# Patient Record
Sex: Male | Born: 1975 | Race: White | Hispanic: No | Marital: Married | Smoking: Former smoker
Health system: Southern US, Community
[De-identification: ages and names within clinical notes are randomized; demographics above are authoritative.]

## PROBLEM LIST (undated history)

## (undated) DIAGNOSIS — J189 Pneumonia, unspecified organism: Secondary | ICD-10-CM

## (undated) DIAGNOSIS — T8859XA Other complications of anesthesia, initial encounter: Secondary | ICD-10-CM

## (undated) DIAGNOSIS — F419 Anxiety disorder, unspecified: Secondary | ICD-10-CM

## (undated) DIAGNOSIS — H53009 Unspecified amblyopia, unspecified eye: Secondary | ICD-10-CM

## (undated) DIAGNOSIS — J449 Chronic obstructive pulmonary disease, unspecified: Secondary | ICD-10-CM

## (undated) DIAGNOSIS — H209 Unspecified iridocyclitis: Secondary | ICD-10-CM

## (undated) DIAGNOSIS — Z87442 Personal history of urinary calculi: Secondary | ICD-10-CM

## (undated) DIAGNOSIS — S82401B Unspecified fracture of shaft of right fibula, initial encounter for open fracture type I or II: Secondary | ICD-10-CM

## (undated) DIAGNOSIS — H4311 Vitreous hemorrhage, right eye: Secondary | ICD-10-CM

## (undated) DIAGNOSIS — T4145XA Adverse effect of unspecified anesthetic, initial encounter: Secondary | ICD-10-CM

## (undated) DIAGNOSIS — H509 Unspecified strabismus: Secondary | ICD-10-CM

## (undated) DIAGNOSIS — J439 Emphysema, unspecified: Secondary | ICD-10-CM

## (undated) DIAGNOSIS — K219 Gastro-esophageal reflux disease without esophagitis: Secondary | ICD-10-CM

## (undated) HISTORY — DX: Unspecified amblyopia, unspecified eye: H53.009

---

## 2017-11-29 ENCOUNTER — Inpatient Hospital Stay (HOSPITAL_COMMUNITY): Payer: Worker's Compensation

## 2017-11-29 ENCOUNTER — Emergency Department (HOSPITAL_COMMUNITY): Payer: Worker's Compensation

## 2017-11-29 ENCOUNTER — Emergency Department (HOSPITAL_COMMUNITY): Payer: Worker's Compensation | Admitting: Anesthesiology

## 2017-11-29 ENCOUNTER — Inpatient Hospital Stay (HOSPITAL_COMMUNITY)
Admission: EM | Admit: 2017-11-29 | Discharge: 2017-12-13 | DRG: 003 | Disposition: A | Discharge status: Rehabilitation Facility | Payer: Worker's Compensation | Attending: General Surgery | Admitting: General Surgery

## 2017-11-29 ENCOUNTER — Other Ambulatory Visit: Payer: Self-pay

## 2017-11-29 ENCOUNTER — Encounter (HOSPITAL_COMMUNITY): Admission: EM | Disposition: A | Discharge status: Rehabilitation Facility | Payer: Self-pay | Source: Home / Self Care

## 2017-11-29 DIAGNOSIS — S2232XA Fracture of one rib, left side, initial encounter for closed fracture: Secondary | ICD-10-CM | POA: Diagnosis present

## 2017-11-29 DIAGNOSIS — S82401B Unspecified fracture of shaft of right fibula, initial encounter for open fracture type I or II: Secondary | ICD-10-CM | POA: Diagnosis present

## 2017-11-29 DIAGNOSIS — S02841A Fracture of lateral orbital wall, right side, initial encounter for closed fracture: Secondary | ICD-10-CM | POA: Diagnosis present

## 2017-11-29 DIAGNOSIS — S0240CA Maxillary fracture, right side, initial encounter for closed fracture: Secondary | ICD-10-CM | POA: Diagnosis present

## 2017-11-29 DIAGNOSIS — F329 Major depressive disorder, single episode, unspecified: Secondary | ICD-10-CM | POA: Diagnosis present

## 2017-11-29 DIAGNOSIS — S8782XA Crushing injury of left lower leg, initial encounter: Secondary | ICD-10-CM | POA: Diagnosis present

## 2017-11-29 DIAGNOSIS — S0921XA Traumatic rupture of right ear drum, initial encounter: Secondary | ICD-10-CM | POA: Diagnosis present

## 2017-11-29 DIAGNOSIS — Y9389 Activity, other specified: Secondary | ICD-10-CM

## 2017-11-29 DIAGNOSIS — S36115A Moderate laceration of liver, initial encounter: Secondary | ICD-10-CM | POA: Diagnosis present

## 2017-11-29 DIAGNOSIS — S8780XA Crushing injury of unspecified lower leg, initial encounter: Secondary | ICD-10-CM | POA: Diagnosis present

## 2017-11-29 DIAGNOSIS — Z978 Presence of other specified devices: Secondary | ICD-10-CM

## 2017-11-29 DIAGNOSIS — S0291XD Unspecified fracture of skull, subsequent encounter for fracture with routine healing: Secondary | ICD-10-CM | POA: Diagnosis not present

## 2017-11-29 DIAGNOSIS — R509 Fever, unspecified: Secondary | ICD-10-CM | POA: Diagnosis not present

## 2017-11-29 DIAGNOSIS — G8918 Other acute postprocedural pain: Secondary | ICD-10-CM

## 2017-11-29 DIAGNOSIS — J439 Emphysema, unspecified: Secondary | ICD-10-CM | POA: Diagnosis not present

## 2017-11-29 DIAGNOSIS — S069X9A Unspecified intracranial injury with loss of consciousness of unspecified duration, initial encounter: Secondary | ICD-10-CM | POA: Diagnosis not present

## 2017-11-29 DIAGNOSIS — S06330D Contusion and laceration of cerebrum, unspecified, without loss of consciousness, subsequent encounter: Secondary | ICD-10-CM | POA: Diagnosis not present

## 2017-11-29 DIAGNOSIS — T1490XA Injury, unspecified, initial encounter: Secondary | ICD-10-CM | POA: Diagnosis not present

## 2017-11-29 DIAGNOSIS — G960 Cerebrospinal fluid leak: Secondary | ICD-10-CM | POA: Diagnosis present

## 2017-11-29 DIAGNOSIS — T148XXA Other injury of unspecified body region, initial encounter: Secondary | ICD-10-CM | POA: Diagnosis not present

## 2017-11-29 DIAGNOSIS — N179 Acute kidney failure, unspecified: Secondary | ICD-10-CM | POA: Diagnosis not present

## 2017-11-29 DIAGNOSIS — E2749 Other adrenocortical insufficiency: Secondary | ICD-10-CM | POA: Diagnosis present

## 2017-11-29 DIAGNOSIS — R739 Hyperglycemia, unspecified: Secondary | ICD-10-CM | POA: Diagnosis not present

## 2017-11-29 DIAGNOSIS — S0219XA Other fracture of base of skull, initial encounter for closed fracture: Secondary | ICD-10-CM | POA: Diagnosis present

## 2017-11-29 DIAGNOSIS — S01311A Laceration without foreign body of right ear, initial encounter: Secondary | ICD-10-CM | POA: Diagnosis present

## 2017-11-29 DIAGNOSIS — Y990 Civilian activity done for income or pay: Secondary | ICD-10-CM

## 2017-11-29 DIAGNOSIS — S0219XD Other fracture of base of skull, subsequent encounter for fracture with routine healing: Secondary | ICD-10-CM | POA: Diagnosis not present

## 2017-11-29 DIAGNOSIS — J9601 Acute respiratory failure with hypoxia: Secondary | ICD-10-CM | POA: Diagnosis not present

## 2017-11-29 DIAGNOSIS — M7989 Other specified soft tissue disorders: Secondary | ICD-10-CM | POA: Diagnosis not present

## 2017-11-29 DIAGNOSIS — Z4659 Encounter for fitting and adjustment of other gastrointestinal appliance and device: Secondary | ICD-10-CM

## 2017-11-29 DIAGNOSIS — I959 Hypotension, unspecified: Secondary | ICD-10-CM | POA: Diagnosis present

## 2017-11-29 DIAGNOSIS — S134XXA Sprain of ligaments of cervical spine, initial encounter: Secondary | ICD-10-CM

## 2017-11-29 DIAGNOSIS — S161XXA Strain of muscle, fascia and tendon at neck level, initial encounter: Secondary | ICD-10-CM | POA: Diagnosis not present

## 2017-11-29 DIAGNOSIS — F419 Anxiety disorder, unspecified: Secondary | ICD-10-CM | POA: Diagnosis not present

## 2017-11-29 DIAGNOSIS — S0291XS Unspecified fracture of skull, sequela: Secondary | ICD-10-CM | POA: Diagnosis not present

## 2017-11-29 DIAGNOSIS — S36039A Unspecified laceration of spleen, initial encounter: Secondary | ICD-10-CM

## 2017-11-29 DIAGNOSIS — T796XXA Traumatic ischemia of muscle, initial encounter: Secondary | ICD-10-CM | POA: Diagnosis not present

## 2017-11-29 DIAGNOSIS — S7292XA Unspecified fracture of left femur, initial encounter for closed fracture: Secondary | ICD-10-CM

## 2017-11-29 DIAGNOSIS — F430 Acute stress reaction: Secondary | ICD-10-CM | POA: Diagnosis present

## 2017-11-29 DIAGNOSIS — J690 Pneumonitis due to inhalation of food and vomit: Secondary | ICD-10-CM | POA: Diagnosis not present

## 2017-11-29 DIAGNOSIS — R40241 Glasgow coma scale score 13-15, unspecified time: Secondary | ICD-10-CM | POA: Diagnosis present

## 2017-11-29 DIAGNOSIS — D62 Acute posthemorrhagic anemia: Secondary | ICD-10-CM | POA: Diagnosis not present

## 2017-11-29 DIAGNOSIS — S02609A Fracture of mandible, unspecified, initial encounter for closed fracture: Secondary | ICD-10-CM | POA: Diagnosis present

## 2017-11-29 DIAGNOSIS — E872 Acidosis: Secondary | ICD-10-CM | POA: Diagnosis present

## 2017-11-29 DIAGNOSIS — S36031A Moderate laceration of spleen, initial encounter: Secondary | ICD-10-CM | POA: Diagnosis present

## 2017-11-29 DIAGNOSIS — S060X9A Concussion with loss of consciousness of unspecified duration, initial encounter: Secondary | ICD-10-CM | POA: Diagnosis present

## 2017-11-29 DIAGNOSIS — R52 Pain, unspecified: Secondary | ICD-10-CM | POA: Diagnosis not present

## 2017-11-29 DIAGNOSIS — E871 Hypo-osmolality and hyponatremia: Secondary | ICD-10-CM | POA: Diagnosis not present

## 2017-11-29 DIAGNOSIS — S36113A Laceration of liver, unspecified degree, initial encounter: Secondary | ICD-10-CM | POA: Diagnosis not present

## 2017-11-29 DIAGNOSIS — S82401C Unspecified fracture of shaft of right fibula, initial encounter for open fracture type IIIA, IIIB, or IIIC: Secondary | ICD-10-CM | POA: Diagnosis present

## 2017-11-29 DIAGNOSIS — Z880 Allergy status to penicillin: Secondary | ICD-10-CM

## 2017-11-29 DIAGNOSIS — H9191 Unspecified hearing loss, right ear: Secondary | ICD-10-CM | POA: Diagnosis present

## 2017-11-29 DIAGNOSIS — G510 Bell's palsy: Secondary | ICD-10-CM | POA: Diagnosis present

## 2017-11-29 DIAGNOSIS — S82453B Displaced comminuted fracture of shaft of unspecified fibula, initial encounter for open fracture type I or II: Secondary | ICD-10-CM

## 2017-11-29 DIAGNOSIS — Z87891 Personal history of nicotine dependence: Secondary | ICD-10-CM

## 2017-11-29 DIAGNOSIS — S022XXA Fracture of nasal bones, initial encounter for closed fracture: Secondary | ICD-10-CM | POA: Diagnosis present

## 2017-11-29 DIAGNOSIS — Z419 Encounter for procedure for purposes other than remedying health state, unspecified: Secondary | ICD-10-CM

## 2017-11-29 DIAGNOSIS — F515 Nightmare disorder: Secondary | ICD-10-CM | POA: Diagnosis not present

## 2017-11-29 DIAGNOSIS — W208XXA Other cause of strike by thrown, projected or falling object, initial encounter: Secondary | ICD-10-CM | POA: Diagnosis present

## 2017-11-29 DIAGNOSIS — S82201B Unspecified fracture of shaft of right tibia, initial encounter for open fracture type I or II: Secondary | ICD-10-CM | POA: Diagnosis present

## 2017-11-29 DIAGNOSIS — Z930 Tracheostomy status: Secondary | ICD-10-CM | POA: Diagnosis not present

## 2017-11-29 DIAGNOSIS — S0292XA Unspecified fracture of facial bones, initial encounter for closed fracture: Secondary | ICD-10-CM

## 2017-11-29 DIAGNOSIS — D72829 Elevated white blood cell count, unspecified: Secondary | ICD-10-CM | POA: Diagnosis not present

## 2017-11-29 DIAGNOSIS — S06339S Contusion and laceration of cerebrum, unspecified, with loss of consciousness of unspecified duration, sequela: Secondary | ICD-10-CM | POA: Diagnosis not present

## 2017-11-29 DIAGNOSIS — H1131 Conjunctival hemorrhage, right eye: Secondary | ICD-10-CM | POA: Diagnosis not present

## 2017-11-29 DIAGNOSIS — F4310 Post-traumatic stress disorder, unspecified: Secondary | ICD-10-CM | POA: Diagnosis not present

## 2017-11-29 DIAGNOSIS — S7222XA Displaced subtrochanteric fracture of left femur, initial encounter for closed fracture: Secondary | ICD-10-CM | POA: Diagnosis present

## 2017-11-29 DIAGNOSIS — S93402A Sprain of unspecified ligament of left ankle, initial encounter: Secondary | ICD-10-CM | POA: Diagnosis present

## 2017-11-29 DIAGNOSIS — S8781XA Crushing injury of right lower leg, initial encounter: Secondary | ICD-10-CM | POA: Diagnosis present

## 2017-11-29 HISTORY — PX: INTRAMEDULLARY (IM) NAIL INTERTROCHANTERIC: SHX5875

## 2017-11-29 HISTORY — DX: Unspecified fracture of shaft of right fibula, initial encounter for open fracture type I or II: S82.401B

## 2017-11-29 HISTORY — DX: Chronic obstructive pulmonary disease, unspecified: J44.9

## 2017-11-29 HISTORY — PX: APPLICATION OF WOUND VAC: SHX5189

## 2017-11-29 HISTORY — DX: Unspecified iridocyclitis: H20.9

## 2017-11-29 HISTORY — DX: Unspecified strabismus: H50.9

## 2017-11-29 HISTORY — DX: Gastro-esophageal reflux disease without esophagitis: K21.9

## 2017-11-29 HISTORY — PX: FACIAL LACERATION REPAIR: SHX6589

## 2017-11-29 HISTORY — PX: I & D EXTREMITY: SHX5045

## 2017-11-29 HISTORY — DX: Emphysema, unspecified: J43.9

## 2017-11-29 LAB — POCT I-STAT 7, (LYTES, BLD GAS, ICA,H+H)
ACID-BASE DEFICIT: 6 mmol/L — AB (ref 0.0–2.0)
ACID-BASE DEFICIT: 5 mmol/L — AB (ref 0.0–2.0)
Acid-base deficit: 6 mmol/L — ABNORMAL HIGH (ref 0.0–2.0)
BICARBONATE: 21.4 mmol/L (ref 20.0–28.0)
BICARBONATE: 21.6 mmol/L (ref 20.0–28.0)
Bicarbonate: 21.7 mmol/L (ref 20.0–28.0)
CALCIUM ION: 1.14 mmol/L — AB (ref 1.15–1.40)
CALCIUM ION: 1.11 mmol/L — AB (ref 1.15–1.40)
Calcium, Ion: 1.14 mmol/L — ABNORMAL LOW (ref 1.15–1.40)
HCT: 43 % (ref 39.0–52.0)
HCT: 36 % — ABNORMAL LOW (ref 39.0–52.0)
HEMATOCRIT: 29 % — AB (ref 39.0–52.0)
HEMOGLOBIN: 12.2 g/dL — AB (ref 13.0–17.0)
HEMOGLOBIN: 9.9 g/dL — AB (ref 13.0–17.0)
Hemoglobin: 14.6 g/dL (ref 13.0–17.0)
O2 Saturation: 99 %
O2 Saturation: 98 %
O2 Saturation: 98 %
PCO2 ART: 45 mmHg (ref 32.0–48.0)
PH ART: 7.339 — AB (ref 7.350–7.450)
PO2 ART: 103 mmHg (ref 83.0–108.0)
Potassium: 3.2 mmol/L — ABNORMAL LOW (ref 3.5–5.1)
Potassium: 3.8 mmol/L (ref 3.5–5.1)
Potassium: 4.2 mmol/L (ref 3.5–5.1)
SODIUM: 138 mmol/L (ref 135–145)
SODIUM: 138 mmol/L (ref 135–145)
Sodium: 140 mmol/L (ref 135–145)
TCO2: 23 mmol/L (ref 22–32)
TCO2: 23 mmol/L (ref 22–32)
TCO2: 23 mmol/L (ref 22–32)
pCO2 arterial: 48.8 mmHg — ABNORMAL HIGH (ref 32.0–48.0)
pCO2 arterial: 39.1 mmHg (ref 32.0–48.0)
pH, Arterial: 7.25 — ABNORMAL LOW (ref 7.350–7.450)
pH, Arterial: 7.28 — ABNORMAL LOW (ref 7.350–7.450)
pO2, Arterial: 139 mmHg — ABNORMAL HIGH (ref 83.0–108.0)
pO2, Arterial: 100 mmHg (ref 83.0–108.0)

## 2017-11-29 LAB — I-STAT CG4 LACTIC ACID, ED: LACTIC ACID, VENOUS: 3.13 mmol/L — AB (ref 0.5–1.9)

## 2017-11-29 LAB — COMPREHENSIVE METABOLIC PANEL
ALBUMIN: 3.3 g/dL — AB (ref 3.5–5.0)
ALT: 112 U/L — ABNORMAL HIGH (ref 0–44)
AST: 118 U/L — AB (ref 15–41)
Alkaline Phosphatase: 75 U/L (ref 38–126)
Anion gap: 10 (ref 5–15)
BUN: 16 mg/dL (ref 6–20)
CHLORIDE: 109 mmol/L (ref 98–111)
CO2: 19 mmol/L — AB (ref 22–32)
Calcium: 8 mg/dL — ABNORMAL LOW (ref 8.9–10.3)
Creatinine, Ser: 1.16 mg/dL (ref 0.61–1.24)
GFR calc Af Amer: >60 mL/min (ref >60)
GFR calc non Af Amer: >60 mL/min (ref >60)
GLUCOSE: 164 mg/dL — AB (ref 70–99)
POTASSIUM: 3.8 mmol/L (ref 3.5–5.1)
Sodium: 138 mmol/L (ref 135–145)
Total Bilirubin: 0.5 mg/dL (ref 0.3–1.2)
Total Protein: 5.8 g/dL — ABNORMAL LOW (ref 6.5–8.1)

## 2017-11-29 LAB — ABO/RH: ABO/RH(D): B NEG

## 2017-11-29 LAB — CBC WITH DIFFERENTIAL/PLATELET
ABS IMMATURE GRANULOCYTES: 0.17 10*3/uL — AB (ref 0.00–0.07)
BASOS PCT: 0 %
Basophils Absolute: 0.1 10*3/uL (ref 0.0–0.1)
Eosinophils Absolute: 0.4 10*3/uL (ref 0.0–0.5)
Eosinophils Relative: 2 %
HEMATOCRIT: 46 % (ref 39.0–52.0)
Hemoglobin: 14.9 g/dL (ref 13.0–17.0)
Immature Granulocytes: 1 %
LYMPHS ABS: 3.5 10*3/uL (ref 0.7–4.0)
LYMPHS PCT: 18 %
MCH: 29.8 pg (ref 26.0–34.0)
MCHC: 32.4 g/dL (ref 30.0–36.0)
MCV: 92 fL (ref 80.0–100.0)
MONOS PCT: 5 %
Monocytes Absolute: 1 10*3/uL (ref 0.1–1.0)
NEUTROS PCT: 74 %
Neutro Abs: 14.5 10*3/uL — ABNORMAL HIGH (ref 1.7–7.7)
PLATELETS: 230 10*3/uL (ref 150–400)
RBC: 5 MIL/uL (ref 4.22–5.81)
RDW: 13 % (ref 11.5–15.5)
WBC: 19.6 10*3/uL — ABNORMAL HIGH (ref 4.0–10.5)
nRBC: 0 % (ref 0.0–0.2)

## 2017-11-29 LAB — I-STAT CHEM 8, ED
BUN: 27 mg/dL — AB (ref 6–20)
Calcium, Ion: 0.95 mmol/L — ABNORMAL LOW (ref 1.15–1.40)
Chloride: 107 mmol/L (ref 98–111)
Creatinine, Ser: 1.1 mg/dL (ref 0.61–1.24)
Glucose, Bld: 159 mg/dL — ABNORMAL HIGH (ref 70–99)
HCT: 43 % (ref 39.0–52.0)
Hemoglobin: 14.6 g/dL (ref 13.0–17.0)
Potassium: 5.3 mmol/L — ABNORMAL HIGH (ref 3.5–5.1)
Sodium: 137 mmol/L (ref 135–145)
TCO2: 22 mmol/L (ref 22–32)

## 2017-11-29 LAB — PROTIME-INR
INR: 1.08
Prothrombin Time: 13.9 seconds (ref 11.4–15.2)

## 2017-11-29 LAB — BPAM FFP
BLOOD PRODUCT EXPIRATION DATE: 201911052359
Blood Product Expiration Date: 201911052359
ISSUE DATE / TIME: 201910151050
ISSUE DATE / TIME: 201910151050
UNIT TYPE AND RH: 6200
Unit Type and Rh: 6200

## 2017-11-29 LAB — PREPARE FRESH FROZEN PLASMA
UNIT DIVISION: 0
Unit division: 0

## 2017-11-29 LAB — LACTIC ACID, PLASMA: Lactic Acid, Venous: 1.4 mmol/L (ref 0.5–1.9)

## 2017-11-29 LAB — ETHANOL: Alcohol, Ethyl (B): <10 mg/dL (ref <10)

## 2017-11-29 LAB — TRIGLYCERIDES
TRIGLYCERIDES: 198 mg/dL — AB (ref <150)
Triglycerides: 102 mg/dL (ref <150)

## 2017-11-29 LAB — PREPARE RBC (CROSSMATCH)

## 2017-11-29 LAB — CK
CK TOTAL: 1815 U/L — AB (ref 49–397)
Total CK: 4575 U/L — ABNORMAL HIGH (ref 49–397)

## 2017-11-29 SURGERY — FIXATION, FRACTURE, INTERTROCHANTERIC, WITH INTRAMEDULLARY ROD
Anesthesia: General | Site: Leg Upper | Laterality: Right

## 2017-11-29 MED ORDER — FENTANYL BOLUS VIA INFUSION
50.0000 ug | INTRAVENOUS | Status: DC | PRN
  Administered 2017-12-01: 50 ug via INTRAVENOUS
  Filled 2017-11-29 (×2): qty 50

## 2017-11-29 MED ORDER — VANCOMYCIN HCL 1000 MG IV SOLR
INTRAVENOUS | Status: DC | PRN
  Administered 2017-11-29: 1000 mg via TOPICAL

## 2017-11-29 MED ORDER — MIDAZOLAM HCL 2 MG/2ML IJ SOLN: 2.0000 mg | INTRAMUSCULAR | Status: DC | PRN

## 2017-11-29 MED ORDER — PROPOFOL 1000 MG/100ML IV EMUL
0.0000 ug/kg/min | INTRAVENOUS | Status: DC
  Administered 2017-11-29: 20 ug/kg/min via INTRAVENOUS

## 2017-11-29 MED ORDER — SODIUM CHLORIDE 0.9 % IV SOLN
INTRAVENOUS | Status: DC | PRN
  Administered 2017-11-29: 25 ug/min via INTRAVENOUS

## 2017-11-29 MED ORDER — ONDANSETRON 4 MG PO TBDP: 4.0000 mg | ORAL_TABLET | Freq: Four times a day (QID) | ORAL | Status: DC | PRN

## 2017-11-29 MED ORDER — FENTANYL CITRATE (PF) 250 MCG/5ML IJ SOLN
INTRAMUSCULAR | Status: AC
  Filled 2017-11-29: qty 5

## 2017-11-29 MED ORDER — FENTANYL CITRATE (PF) 250 MCG/5ML IJ SOLN
INTRAMUSCULAR | Status: DC | PRN
  Administered 2017-11-29: 50 ug via INTRAVENOUS
  Administered 2017-11-29: 100 ug via INTRAVENOUS
  Administered 2017-11-29: 250 ug via INTRAVENOUS
  Administered 2017-11-29 (×2): 50 ug via INTRAVENOUS
  Administered 2017-11-29 (×2): 100 ug via INTRAVENOUS
  Administered 2017-11-29: 50 ug via INTRAVENOUS

## 2017-11-29 MED ORDER — CEFAZOLIN SODIUM-DEXTROSE 2-3 GM-%(50ML) IV SOLR
INTRAVENOUS | Status: DC | PRN
  Administered 2017-11-29: 2 g via INTRAVENOUS

## 2017-11-29 MED ORDER — ROCURONIUM BROMIDE 50 MG/5ML IV SOSY
PREFILLED_SYRINGE | INTRAVENOUS | Status: AC
  Filled 2017-11-29: qty 10

## 2017-11-29 MED ORDER — MIDAZOLAM HCL 5 MG/5ML IJ SOLN
INTRAMUSCULAR | Status: DC | PRN
  Administered 2017-11-29: 2 mg via INTRAVENOUS

## 2017-11-29 MED ORDER — FENTANYL CITRATE (PF) 100 MCG/2ML IJ SOLN
100.0000 ug | INTRAMUSCULAR | Status: DC | PRN
  Administered 2017-11-29: 50 ug via INTRAVENOUS

## 2017-11-29 MED ORDER — ETOMIDATE 2 MG/ML IV SOLN
INTRAVENOUS | Status: AC | PRN
  Administered 2017-11-29: 20 mg via INTRAVENOUS

## 2017-11-29 MED ORDER — FENTANYL 2500MCG IN NS 250ML (10MCG/ML) PREMIX INFUSION
25.0000 ug/h | INTRAVENOUS | Status: DC
  Administered 2017-11-29: 200 ug/h via INTRAVENOUS
  Administered 2017-11-30 – 2017-12-01 (×3): 150 ug/h via INTRAVENOUS
  Administered 2017-12-02: 200 ug/h via INTRAVENOUS
  Filled 2017-11-29 (×4): qty 250

## 2017-11-29 MED ORDER — SODIUM CHLORIDE 0.45 % IV SOLN
INTRAVENOUS | Status: DC
  Administered 2017-11-29 – 2017-12-06 (×21): via INTRAVENOUS
  Filled 2017-11-29: qty 1000

## 2017-11-29 MED ORDER — IOHEXOL 300 MG/ML  SOLN
100.0000 mL | Freq: Once | INTRAMUSCULAR | Status: AC | PRN
  Administered 2017-11-29: 100 mL via INTRAVENOUS

## 2017-11-29 MED ORDER — NON FORMULARY
Status: DC | PRN
  Administered 2017-11-29: 45 mL

## 2017-11-29 MED ORDER — FAMOTIDINE IN NACL 20-0.9 MG/50ML-% IV SOLN
20.0000 mg | Freq: Every day | INTRAVENOUS | Status: DC
  Administered 2017-11-30 – 2017-12-08 (×6): 20 mg via INTRAVENOUS
  Filled 2017-11-29 (×8): qty 50

## 2017-11-29 MED ORDER — 0.9 % SODIUM CHLORIDE (POUR BTL) OPTIME
TOPICAL | Status: DC | PRN
  Administered 2017-11-29: 1000 mL

## 2017-11-29 MED ORDER — PROPOFOL 1000 MG/100ML IV EMUL
INTRAVENOUS | Status: AC
  Administered 2017-11-29: 20 ug/kg/min via INTRAVENOUS
  Filled 2017-11-29: qty 100

## 2017-11-29 MED ORDER — LACTATED RINGERS IV SOLN
INTRAVENOUS | Status: DC | PRN
  Administered 2017-11-29 (×2): via INTRAVENOUS

## 2017-11-29 MED ORDER — SODIUM CHLORIDE 0.9 % IV SOLN
10.0000 mL/h | Freq: Once | INTRAVENOUS | Status: AC
  Administered 2017-12-05: 10 mL/h via INTRAVENOUS

## 2017-11-29 MED ORDER — SODIUM CHLORIDE 0.9 % IR SOLN
Status: DC | PRN
  Administered 2017-11-29 (×2): 3000 mL

## 2017-11-29 MED ORDER — ROCURONIUM BROMIDE 10 MG/ML (PF) SYRINGE
PREFILLED_SYRINGE | INTRAVENOUS | Status: DC | PRN
  Administered 2017-11-29 (×4): 50 mg via INTRAVENOUS

## 2017-11-29 MED ORDER — ALBUMIN HUMAN 5 % IV SOLN
INTRAVENOUS | Status: DC | PRN
  Administered 2017-11-29 (×3): via INTRAVENOUS

## 2017-11-29 MED ORDER — FENTANYL CITRATE (PF) 100 MCG/2ML IJ SOLN
INTRAMUSCULAR | Status: AC
  Administered 2017-11-29: 50 ug
  Filled 2017-11-29: qty 2

## 2017-11-29 MED ORDER — FENTANYL CITRATE (PF) 100 MCG/2ML IJ SOLN: 100.0000 ug | INTRAMUSCULAR | Status: DC | PRN

## 2017-11-29 MED ORDER — LACTATED RINGERS IV SOLN
INTRAVENOUS | Status: DC | PRN
  Administered 2017-11-29 (×4): via INTRAVENOUS

## 2017-11-29 MED ORDER — BISACODYL 10 MG RE SUPP: 10.0000 mg | Freq: Every day | RECTAL | Status: DC | PRN

## 2017-11-29 MED ORDER — ROCURONIUM BROMIDE 50 MG/5ML IV SOLN
INTRAVENOUS | Status: AC | PRN
  Administered 2017-11-29: 100 mg via INTRAVENOUS

## 2017-11-29 MED ORDER — ORAL CARE MOUTH RINSE
15.0000 mL | OROMUCOSAL | Status: DC
  Administered 2017-11-29 – 2017-12-02 (×33): 15 mL via OROMUCOSAL

## 2017-11-29 MED ORDER — METOPROLOL TARTRATE 5 MG/5ML IV SOLN: 5.0000 mg | Freq: Four times a day (QID) | INTRAVENOUS | Status: DC | PRN

## 2017-11-29 MED ORDER — CEFAZOLIN SODIUM-DEXTROSE 2-4 GM/100ML-% IV SOLN
2.0000 g | Freq: Three times a day (TID) | INTRAVENOUS | Status: AC
  Administered 2017-11-29 – 2017-12-01 (×6): 2 g via INTRAVENOUS
  Filled 2017-11-29 (×6): qty 100

## 2017-11-29 MED ORDER — ONDANSETRON HCL 4 MG/2ML IJ SOLN: 4.0000 mg | Freq: Four times a day (QID) | INTRAMUSCULAR | Status: DC | PRN

## 2017-11-29 MED ORDER — TOBRAMYCIN SULFATE 1.2 G IJ SOLR
INTRAMUSCULAR | Status: AC
  Filled 2017-11-29: qty 1.2

## 2017-11-29 MED ORDER — FENTANYL CITRATE (PF) 100 MCG/2ML IJ SOLN: 50.0000 ug | Freq: Once | INTRAMUSCULAR | Status: DC

## 2017-11-29 MED ORDER — PANTOPRAZOLE SODIUM 40 MG PO TBEC
40.0000 mg | DELAYED_RELEASE_TABLET | Freq: Every day | ORAL | Status: DC
  Administered 2017-12-03 – 2017-12-04 (×2): 40 mg via ORAL
  Filled 2017-11-29 (×5): qty 1

## 2017-11-29 MED ORDER — ROCURONIUM BROMIDE 50 MG/5ML IV SOLN
INTRAVENOUS | Status: AC | PRN
  Administered 2017-11-29: 70 mg via INTRAVENOUS

## 2017-11-29 MED ORDER — PROPOFOL 1000 MG/100ML IV EMUL
0.0000 ug/kg/min | INTRAVENOUS | Status: DC
  Administered 2017-11-29 – 2017-11-30 (×6): 30 ug/kg/min via INTRAVENOUS
  Administered 2017-12-01 (×2): 20 ug/kg/min via INTRAVENOUS
  Filled 2017-11-29 (×7): qty 100

## 2017-11-29 MED ORDER — CHLORHEXIDINE GLUCONATE 0.12% ORAL RINSE (MEDLINE KIT)
15.0000 mL | Freq: Two times a day (BID) | OROMUCOSAL | Status: DC
  Administered 2017-11-29 – 2017-12-13 (×24): 15 mL via OROMUCOSAL

## 2017-11-29 MED ORDER — VANCOMYCIN HCL 1000 MG IV SOLR
INTRAVENOUS | Status: AC
  Filled 2017-11-29: qty 1000

## 2017-11-29 MED ORDER — TOBRAMYCIN SULFATE 1.2 G IJ SOLR
INTRAMUSCULAR | Status: DC | PRN
  Administered 2017-11-29: 1.2 g via TOPICAL

## 2017-11-29 SURGICAL SUPPLY — 94 items
BANDAGE ACE 4X5 VEL STRL LF (GAUZE/BANDAGES/DRESSINGS) ×24 IMPLANT
BANDAGE ACE 6X5 VEL STRL LF (GAUZE/BANDAGES/DRESSINGS) IMPLANT
BANDAGE ESMARK 6X9 LF (GAUZE/BANDAGES/DRESSINGS) IMPLANT
BIT DRILL SHORT 4.2 (BIT) ×4 IMPLANT
BLADE SURG 10 STRL SS (BLADE) ×6 IMPLANT
BNDG COHESIVE 4X5 TAN STRL (GAUZE/BANDAGES/DRESSINGS) IMPLANT
BNDG COHESIVE 6X5 TAN STRL LF (GAUZE/BANDAGES/DRESSINGS) ×6 IMPLANT
BNDG ELASTIC 6X10 VLCR STRL LF (GAUZE/BANDAGES/DRESSINGS) ×6 IMPLANT
BNDG ESMARK 6X9 LF (GAUZE/BANDAGES/DRESSINGS)
BNDG GAUZE ELAST 4 BULKY (GAUZE/BANDAGES/DRESSINGS) ×6 IMPLANT
BNDG GAUZE STRTCH 6 (GAUZE/BANDAGES/DRESSINGS) IMPLANT
BRUSH SCRUB SURG 4.25 DISP (MISCELLANEOUS) ×36 IMPLANT
CANISTER WOUND CARE 500ML ATS (WOUND CARE) ×6 IMPLANT
COVER MAYO STAND STRL (DRAPES) ×6 IMPLANT
COVER PERINEAL POST (MISCELLANEOUS) IMPLANT
COVER SURGICAL LIGHT HANDLE (MISCELLANEOUS) ×12 IMPLANT
COVER WAND RF STERILE (DRAPES) ×6 IMPLANT
DRAPE C-ARM 42X72 X-RAY (DRAPES) ×12 IMPLANT
DRAPE C-ARMOR (DRAPES) ×6 IMPLANT
DRAPE EXTREMITY T 121X128X90 (DRAPE) ×6 IMPLANT
DRAPE HALF SHEET 40X57 (DRAPES) IMPLANT
DRAPE ORTHO SPLIT 77X108 STRL (DRAPES) ×4
DRAPE PERI GROIN 82X75IN TIB (DRAPES) IMPLANT
DRAPE STERI IOBAN 125X83 (DRAPES) IMPLANT
DRAPE SURG ORHT 6 SPLT 77X108 (DRAPES) ×8 IMPLANT
DRAPE U-SHAPE 47X51 STRL (DRAPES) ×12 IMPLANT
DRILL BIT SHORT 4.2 (BIT) ×2
DRSG ADAPTIC 3X8 NADH LF (GAUZE/BANDAGES/DRESSINGS) IMPLANT
DRSG EMULSION OIL 3X3 NADH (GAUZE/BANDAGES/DRESSINGS) IMPLANT
DRSG MEPILEX BORDER 4X4 (GAUZE/BANDAGES/DRESSINGS) ×6 IMPLANT
DRSG MEPILEX BORDER 4X8 (GAUZE/BANDAGES/DRESSINGS) IMPLANT
DRSG PAD ABDOMINAL 8X10 ST (GAUZE/BANDAGES/DRESSINGS) ×6 IMPLANT
DRSG VAC ATS MED SENSATRAC (GAUZE/BANDAGES/DRESSINGS) ×6 IMPLANT
ELECT REM PT RETURN 9FT ADLT (ELECTROSURGICAL) ×6
ELECTRODE REM PT RTRN 9FT ADLT (ELECTROSURGICAL) ×4 IMPLANT
GAUZE SPONGE 4X4 12PLY STRL (GAUZE/BANDAGES/DRESSINGS) ×6 IMPLANT
GAUZE XEROFORM 1X8 LF (GAUZE/BANDAGES/DRESSINGS) ×6 IMPLANT
GLOVE BIO SURGEON STRL SZ7.5 (GLOVE) ×18 IMPLANT
GLOVE BIO SURGEON STRL SZ8 (GLOVE) ×18 IMPLANT
GLOVE BIOGEL PI IND STRL 7.5 (GLOVE) ×8 IMPLANT
GLOVE BIOGEL PI IND STRL 8 (GLOVE) ×8 IMPLANT
GLOVE BIOGEL PI INDICATOR 7.5 (GLOVE) ×4
GLOVE BIOGEL PI INDICATOR 8 (GLOVE) ×4
GLOVE SURG SS PI 6.5 STRL IVOR (GLOVE) ×24 IMPLANT
GOWN STRL REUS W/ TWL LRG LVL3 (GOWN DISPOSABLE) ×16 IMPLANT
GOWN STRL REUS W/ TWL XL LVL3 (GOWN DISPOSABLE) ×8 IMPLANT
GOWN STRL REUS W/TWL LRG LVL3 (GOWN DISPOSABLE) ×8
GOWN STRL REUS W/TWL XL LVL3 (GOWN DISPOSABLE) ×4
GUIDEWIRE 3.2X400 (WIRE) ×12 IMPLANT
HANDPIECE INTERPULSE COAX TIP (DISPOSABLE) ×2
KIT BASIN OR (CUSTOM PROCEDURE TRAY) ×6 IMPLANT
KIT TURNOVER KIT B (KITS) ×6 IMPLANT
LINER BOOT UNIVERSAL DISP (MISCELLANEOUS) IMPLANT
MANIFOLD NEPTUNE II (INSTRUMENTS) ×6 IMPLANT
NAIL CANN FRN 9X400 LT (Nail) ×6 IMPLANT
NEEDLE HYPO 21X1.5 SAFETY (NEEDLE) IMPLANT
NS IRRIG 1000ML POUR BTL (IV SOLUTION) ×6 IMPLANT
PACK GENERAL/GYN (CUSTOM PROCEDURE TRAY) ×6 IMPLANT
PACK ORTHO EXTREMITY (CUSTOM PROCEDURE TRAY) IMPLANT
PACK UNIVERSAL I (CUSTOM PROCEDURE TRAY) ×6 IMPLANT
PAD ARMBOARD 7.5X6 YLW CONV (MISCELLANEOUS) ×24 IMPLANT
PAD CAST 4YDX4 CTTN HI CHSV (CAST SUPPLIES) ×12 IMPLANT
PADDING CAST COTTON 4X4 STRL (CAST SUPPLIES) ×6
PADDING CAST COTTON 6X4 STRL (CAST SUPPLIES) IMPLANT
PENCIL BUTTON HOLSTER BLD 10FT (ELECTRODE) ×6 IMPLANT
REAMER ROD DEEP FLUTE 2.5X950 (INSTRUMENTS) ×6 IMPLANT
SCREW 6.5MM W/T25 STARDRIVE (Screw) ×6 IMPLANT
SCREW LOCKING 5.0MMX44MM (Screw) ×6 IMPLANT
SCREW LOCKING 5.0MX50M (Screw) ×6 IMPLANT
SCREW RECON W/T25 STRD 90MM (Screw) ×6 IMPLANT
SET HNDPC FAN SPRY TIP SCT (DISPOSABLE) ×4 IMPLANT
SPONGE LAP 18X18 X RAY DECT (DISPOSABLE) IMPLANT
STAPLER VISISTAT 35W (STAPLE) ×6 IMPLANT
STOCKINETTE IMPERVIOUS 9X36 MD (GAUZE/BANDAGES/DRESSINGS) ×6 IMPLANT
STOCKINETTE IMPERVIOUS LG (DRAPES) ×6 IMPLANT
SUCTION FRAZIER HANDLE 10FR (MISCELLANEOUS) ×2
SUCTION TUBE FRAZIER 10FR DISP (MISCELLANEOUS) ×4 IMPLANT
SUT ETHILON 2 0 PSLX (SUTURE) ×6 IMPLANT
SUT ETHILON 3 0 PS 1 (SUTURE) IMPLANT
SUT MNCRL AB 4-0 PS2 18 (SUTURE) ×6 IMPLANT
SUT PDS AB 2-0 CT1 27 (SUTURE) IMPLANT
SUT VIC AB 0 CT1 27 (SUTURE) ×2
SUT VIC AB 0 CT1 27XBRD ANBCTR (SUTURE) ×4 IMPLANT
SUT VIC AB 1 CT1 27 (SUTURE) ×2
SUT VIC AB 1 CT1 27XBRD ANBCTR (SUTURE) ×4 IMPLANT
SUT VIC AB 2-0 CT1 27 (SUTURE) ×2
SUT VIC AB 2-0 CT1 TAPERPNT 27 (SUTURE) ×4 IMPLANT
SWAB CULTURE ESWAB REG 1ML (MISCELLANEOUS) IMPLANT
TOWEL OR 17X24 6PK STRL BLUE (TOWEL DISPOSABLE) ×6 IMPLANT
TOWEL OR 17X26 10 PK STRL BLUE (TOWEL DISPOSABLE) ×12 IMPLANT
TUBE CONNECTING 12'X1/4 (SUCTIONS) ×2
TUBE CONNECTING 12X1/4 (SUCTIONS) ×10 IMPLANT
UNDERPAD 30X30 (UNDERPADS AND DIAPERS) ×18 IMPLANT
YANKAUER SUCT BULB TIP NO VENT (SUCTIONS) ×12 IMPLANT

## 2017-11-29 NOTE — Anesthesia Procedure Notes (Signed)
Arterial Line Insertion Start/End10/15/2019 12:05 PM, 11/29/2017 12:15 PM Performed by: CRNA  Patient location: OR. Preanesthetic checklist: patient identified, IV checked, site marked, risks and benefits discussed, surgical consent, monitors and equipment checked, pre-op evaluation, timeout performed and anesthesia consent Right, radial was placed Catheter size: 20 G Hand hygiene performed , maximum sterile barriers used  and Seldinger technique used Allen's test indicative of satisfactory collateral circulation Attempts: 1 Procedure performed without using ultrasound guided technique. Following insertion, dressing applied and Biopatch. Post procedure assessment: normal  Patient tolerated the procedure well with no immediate complications.

## 2017-11-29 NOTE — Progress Notes (Signed)
Orthopedic Tech Progress Note Patient Details:  Imre R [name] 09/22/1975 [id number]  Ortho Devices Ortho Device/Splint Location: Bilateral Prafo boots       Saul Fordyce 11/29/2017, 1:42 PM

## 2017-11-29 NOTE — Procedures (Signed)
FAST  Pre-procedure diagnosis: Struck by a falling tree Post-procedure diagnosis: No significant hemoperitoneum, no significant pericardial effusion Procedure: FAST Surgeon: Violeta Gelinas, MD Procedure in detail: The patient's abdomen was imaged in 4 regions with the ultrasound. First, the right upper quadrant was imaged. No free fluid was seen between the right kidney and the liver in Morison's pouch. Next, the epigastrium was imaged. No significant pericardial effusion was seen. Next, the left upper quadrant was imaged. No free fluid was seen between the left kidney and the spleen. Finally, the bladder was imaged. No free fluid was seen next to the bladder in the pelvis. Impression: Negative  Violeta Gelinas, MD, MPH, FACS Trauma: [phone] General Surgery: [phone]

## 2017-11-29 NOTE — Anesthesia Preprocedure Evaluation (Addendum)
Anesthesia Evaluation    Reviewed: Allergy & Precautions, Patient's Chart, lab work & pertinent test resultsPreop documentation limited or incomplete due to emergent nature of procedure.  Airway Mallampati: Intubated       Dental   Pulmonary COPD,  COPD inhaler, former smoker,  CT scan of the chest demonstrates a right and left apical bullous changes. He has had interval resolution of groundglass attenuation in the lower lung fields.          Cardiovascular negative cardio ROS       Neuro/Psych negative neurological ROS  negative psych ROS   GI/Hepatic Neg liver ROS, GERD  ,  Endo/Other  negative endocrine ROS  Renal/GU negative Renal ROS  negative genitourinary   Musculoskeletal   Abdominal   Peds negative pediatric ROS (+)  Hematology negative hematology ROS (+)   Anesthesia Other Findings   Reproductive/Obstetrics negative OB ROS                           Anesthesia Physical Anesthesia Plan  ASA: III and emergent  Anesthesia Plan: General   Post-op Pain Management:    Induction: Intravenous  PONV Risk Score and Plan: 3 and Ondansetron, Dexamethasone and Diphenhydramine  Airway Management Planned: Oral ETT  Additional Equipment:   Intra-op Plan:   Post-operative Plan: Post-operative intubation/ventilation  Informed Consent: I have reviewed the patients History and Physical, chart, labs and discussed the procedure including the risks, benefits and alternatives for the proposed anesthesia with the patient or authorized representative who has indicated his/her understanding and acceptance.     Plan Discussed with: CRNA, Anesthesiologist and Surgeon  Anesthesia Plan Comments:        Anesthesia Quick Evaluation

## 2017-11-29 NOTE — Brief Op Note (Signed)
11/29/2017  11:54 PM  PATIENT:  Willie Blevins [name]  42 y.o. male  PRE-OPERATIVE DIAGNOSIS: 1. CRUSH OF LEFT LEG 2. LEFT FEMORAL SHAFT SUBTROCHANTERIC FRACTURE 3. CRUSH OF RIGHT LEG 4. GRADE 3 A OPEN RIGHT FIBULA SHAFT FRACTURE 5. LACERATION OF RIGHT EAR  POST-OPERATIVE DIAGNOSIS:  1. CRUSH OF LEFT LEG 2. LEFT FEMORAL SHAFT SUBTROCHANTERIC FRACTURE 3. CRUSH OF RIGHT LEG WITH EXTENSIVE MUSCLE INJURY 4. COMMINUTED GRADE 3 A OPEN RIGHT FIBULA SHAFT FRACTURE 5. LEFT COMMINUTED FIBULAR SHAFT FRACTURE 6. STABLE RIGHT ANKLE SYNDESMOSIS 7. STABLE LEFT ANKLE SYNDESMOSIS 8. STABLE LEFT KNEE 9. LACERATION OF RIGHT EAR  PROCEDURES:  Procedure(s): 1. INTRAMEDULLARY (IM) NAIL LEFT FEMORAL SHAFT WITH SYNTHES 9 X 400 MM STATICALLY LOCKED RECON (Left) 2. IRRIGATION AND DEBRIDEMENT EXTREMITY (Right) OPEN FIBULA FRACTURE 3. STRESS FLUORO OF LEFT KNEE 4. STRESS FLUORO OF LEFT ANKLE WITH MANIPULATION OF FIBULAR FRACTURE 5. APPLICATION OF LARGE WOUND VAC  Panel Two 1. EAR LACERATION REPAIR (Left)  SURGEON:  Surgeon(s) and Role: Panel 1:    Myrene Galas, MD - Primary Panel 2:    * Violeta Gelinas, MD - Primary  PHYSICIAN ASSISTANT: Montez Morita, PA-C  ANESTHESIA:   general  I: 500 alb, 4300 LR/ O: UOP 375 mL, EBL:  400 mL   BLOOD ADMINISTERED:350 CC PRBC  DRAINS: WOUND VAC   LOCAL MEDICATIONS USED:  NONE  SPECIMEN:  No Specimen  DISPOSITION OF SPECIMEN:  N/A  COUNTS:  YES  TOURNIQUET:  * No tourniquets in log *  DICTATION: .Other Dictation: Dictation Number [id number]  PLAN OF CARE: Admit to inpatient   PATIENT DISPOSITION:  ICU - intubated and hemodynamically stable.   Delay start of Pharmacological VTE agent (>24hrs) due to surgical blood loss or risk of bleeding: no

## 2017-11-29 NOTE — H&P (Addendum)
[facility] Admission Note  Willie Blevins 08-06-1975  [id number].    Requesting MD: Benjiman Core Chief Complaint/Reason for Consult: hit by tree  HPI:  Willie Blevins is a 42yo male brought into [facility] as a level 1 trauma activation after being struck by a tree. Patient reportedly at work cutting down a tree when it fell and stuck him. Patient remembers the accident. No LOC. GCS 15. Initially level 2 trauma but was upgraded to a level 1 due to hypotension en route. Patient complaining of pain in his head/face, lower abdomen, left hip, and right lower leg. Left leg externally rotated. Denies n/t in any extremity. Patient intubated by EDP for airway protection. BP improved with IVF.  PMH significant for emphysema but patient does not use his inhaler  Anticoagulants: none Quit smoking 1 year ago  ROS: Review of Systems  Constitutional: Negative.   HENT: Positive for ear pain.   Eyes: Negative.   Respiratory: Negative.   Cardiovascular: Negative.   Gastrointestinal: Negative.   Genitourinary: Negative.   Musculoskeletal: Positive for joint pain.       Left hip pain, RLE pain  Skin: Negative.   Neurological: Negative.    All systems reviewed and otherwise negative except for as above  No family history on file.  No past medical history on file.  The histories are not reviewed yet. Please review them in the "History" navigator section and refresh this SmartLink.  Social History:  has no tobacco, alcohol, and drug history on file.  Allergies: Allergies not on file   (Not in a hospital admission)  Prior to Admission medications   Not on File    Blood pressure 124/78, pulse 96, temperature (!) 96.6 F (35.9 C), temperature source Temporal, resp. rate 18, height 6\' 2"  (1.88 m), weight 99.8 kg, SpO2 98 %. Physical Exam: Physical Exam  Constitutional: He is intubated.  Well developed, in mild distress prior to intubation  HENT:  Right Ear: Right ear  exhibits lacerations.  Left Ear: External ear normal.  Ears:  Nose: Nose normal.  Laceration noted in right ear Dried blood on face Multiple missing teeth  Eyes: Conjunctivae and EOM are normal.  R pupil about 6mm and L 4mm, reactive to light bilaterally  Neck: No tracheal deviation present.  C-collar in place  Cardiovascular: Regular rhythm and normal pulses.  Pulses:      Radial pulses are 2+ on the right side, and 2+ on the left side.       Dorsalis pedis pulses are 2+ on the right side, and 2+ on the left side.  Pulmonary/Chest: Breath sounds normal. He is intubated.  Breath sounds bilaterally Abrasions noted to anterior right chest  Abdominal: Soft. Normal appearance and bowel sounds are normal. There is no hepatosplenomegaly. There is no tenderness. No hernia.  Abrasion noted to lower/lateral right abdomen  Musculoskeletal:  Left hip tender and externally rotated Right lower leg with open fracture midshaft/lateral and visible bone  Skin: Skin is warm and dry.  Nursing note and vitals reviewed.    Results for orders placed or performed during the hospital encounter of 11/29/17 (from the past 48 hour(s))  Type and screen Ordered by PROVIDER DEFAULT     Status: None (Preliminary result)   Collection Time: 11/29/17 10:49 AM  Result Value Ref Range   ABO/RH(D) PENDING    Antibody Screen PENDING    Sample Expiration      12/02/2017 Performed at [facility], [address] [place], [place] [address]    Unit Number [id number]    Blood Component Type RBC LR PHER1    Unit division 00    Status of Unit ISSUED    Unit tag comment EMERGENCY RELEASE    Transfusion Status OK TO TRANSFUSE    Crossmatch Result PENDING    Unit Number [id number]    Blood Component Type RED CELLS,LR    Unit division 00    Status of Unit ISSUED    Unit tag comment EMERGENCY RELEASE    Transfusion Status OK TO TRANSFUSE    Crossmatch Result PENDING   Prepare fresh frozen plasma      Status: None (Preliminary result)   Collection Time: 11/29/17 10:49 AM  Result Value Ref Range   Unit Number [id number]    Blood Component Type LIQ PLASMA    Unit division 00    Status of Unit ISSUED    Unit tag comment EMERGENCY RELEASE    Transfusion Status      OK TO TRANSFUSE Performed at [facility], [address], [place], [place] [address]    Unit Number [id number]    Blood Component Type LIQ PLASMA    Unit division 00    Status of Unit ISSUED    Unit tag comment EMERGENCY RELEASE    Transfusion Status OK TO TRANSFUSE    No results found.    Assessment/Plan Hit by tree L subtroch femur fx - to OR with ortho Dr. Carola Frost R open fibula fx - to OR with ortho Dr. Carola Frost R ear lac - Dr. Janee Morn to suture in the OR L rib #11 fx - pain control and pulmonary toilet Grade 2 splenic lac - monitor hg, no intervention needed Grade 1 liver lac - monitor hg, no intervention needed R adrenal hemorrhage - monitor hg, no intervention needed R maxillary sinus fx/ R lateral wall orbit fx/ R mastoid process fx/ Nasal septum fx - ENT Dr. Jearld Fenton to consult. F/u final CT read Emphysema  Plan - To OR with ortho. Will plan to admit to trauma ICU, intubated, postop. ENT consult pending.  Franne Forts, [facility] 11/29/2017, 11:17 AM Pager: [phone] Mon 7:00 am -11:30 AM Tues-Fri 7:00 am-4:30 pm Sat-Sun 7:00 am-11:30 am

## 2017-11-29 NOTE — Op Note (Signed)
11/29/2017  1:23 PM  PATIENT:  Willie Blevins  42 y.o. male  PRE-OPERATIVE DIAGNOSIS: 2 cm laceration right ear  POST-OPERATIVE DIAGNOSIS: 2 cm laceration right ear  PROCEDURE: Irrigation and simple closure 2 cm laceration right ear  SURGEON: Violeta Gelinas, MD  ASSISTANTS: none   ANESTHESIA:   general  EBL:  Total I/O In: 2250 [I.V.:2000; IV Piggyback:250] Out: 250 [Urine:150; Blood:100]  BLOOD ADMINISTERED:none  DRAINS: none   SPECIMEN:  No Specimen  DISPOSITION OF SPECIMEN:  N/A  COUNTS:  YES  DICTATION: Reubin Milan Dictation Emergency consent.  He was in the operating room under anesthesia after his trauma resuscitation.  His right ear was irrigated and cleaned with chlorhexidine.  He was then prepped in a sterile fashion.  I closed the laceration at the anterior exit of his external auditory canal with interrupted 4-0 Monocryl sutures.  There was good hemostasis.  There was a large abrasion up on the pinna and reapplied Xeroform and gauze to that.  All counts were correct.  No complications from this portion of his operative interventions. PATIENT DISPOSITION:  Remains in the OR with Dr. Carola Frost   Delay start of Pharmacological VTE agent (>24hrs) due to surgical blood loss or risk of bleeding:  no  Violeta Gelinas, MD, MPH, FACS Pager: [phone]  10/15/20191:23 PM

## 2017-11-29 NOTE — Anesthesia Procedure Notes (Signed)
Central Venous Catheter Insertion Performed by: Heather Roberts, MD, anesthesiologist Start/End10/15/2019 12:04 PM, 11/29/2017 12:14 PM Patient location: Pre-op. Preanesthetic checklist: patient identified, IV checked, site marked, risks and benefits discussed, surgical consent, monitors and equipment checked, pre-op evaluation, timeout performed and anesthesia consent Position: Trendelenburg Lidocaine 1% used for infiltration and patient sedated Hand hygiene performed , maximum sterile barriers used  and Seldinger technique used Catheter size: 8 Fr Total catheter length 16. Central line was placed.Double lumen Procedure performed using ultrasound guided technique. Ultrasound Notes:anatomy identified, needle tip was noted to be adjacent to the nerve/plexus identified, no ultrasound evidence of intravascular and/or intraneural injection and image(s) printed for medical record Attempts: 1 Following insertion, dressing applied, line sutured and Biopatch. Post procedure assessment: blood return through all ports, free fluid flow and no air  Patient tolerated the procedure well with no immediate complications.

## 2017-11-29 NOTE — ED Notes (Signed)
Actual Level 1 activation time was 10:38

## 2017-11-29 NOTE — Consult Note (Signed)
Reason for Consult:Polytrauma Referring Physician: N Pickering  Willie Blevins is an 42 y.o. male.  HPI: Willie Blevins was working and was hit by a tree. He was brought in as a level 1 trauma activation 2/2 HoTN. He c/o left hip and right lower leg pain mainly. The right lower leg appeared to be an open fx and orthopedic surgery was consulted. He was intubated shortly after arrival to facilitate workup and once known pt was going to OR.  No past medical history on file.  No family history on file.  Social History:  has no tobacco, alcohol, and drug history on file.  Allergies: Allergies not on file  Medications: I have reviewed the patient's current medications.  Results for orders placed or performed during the hospital encounter of 11/29/17 (from the past 48 hour(s))  Type and screen Ordered by PROVIDER DEFAULT     Status: None (Preliminary result)   Collection Time: 11/29/17 10:49 AM  Result Value Ref Range   ABO/RH(D) PENDING    Antibody Screen PENDING    Sample Expiration      12/02/2017 Performed at [facility], [address], [place], [place] [address]    Unit Number [id number]    Blood Component Type RBC LR PHER1    Unit division 00    Status of Unit ISSUED    Unit tag comment EMERGENCY RELEASE    Transfusion Status OK TO TRANSFUSE    Crossmatch Result PENDING    Unit Number [id number]    Blood Component Type RED CELLS,LR    Unit division 00    Status of Unit ISSUED    Unit tag comment EMERGENCY RELEASE    Transfusion Status OK TO TRANSFUSE    Crossmatch Result PENDING   Prepare fresh frozen plasma     Status: None (Preliminary result)   Collection Time: 11/29/17 10:49 AM  Result Value Ref Range   Unit Number [id number]    Blood Component Type LIQ PLASMA    Unit division 00    Status of Unit ISSUED    Unit tag comment EMERGENCY RELEASE    Transfusion Status      OK TO TRANSFUSE Performed at [facility], [address], [place], [place]  [address]    Unit Number [id number]    Blood Component Type LIQ PLASMA    Unit division 00    Status of Unit ISSUED    Unit tag comment EMERGENCY RELEASE    Transfusion Status OK TO TRANSFUSE   I-Stat CG4 Lactic Acid, ED     Status: Abnormal   Collection Time: 11/29/17 11:17 AM  Result Value Ref Range   Lactic Acid, Venous 3.13 (HH) 0.5 - 1.9 mmol/L   Comment NOTIFIED PHYSICIAN   I-stat chem 8, ed     Status: Abnormal   Collection Time: 11/29/17 11:17 AM  Result Value Ref Range   Sodium 137 135 - 145 mmol/L   Potassium 5.3 (H) 3.5 - 5.1 mmol/L   Chloride 107 98 - 111 mmol/L   BUN 27 (H) 6 - 20 mg/dL   Creatinine, Ser 5.63 0.61 - 1.24 mg/dL    Comment: QA FLAGS AND/OR RANGES MODIFIED BY DEMOGRAPHIC UPDATE ON 10/15 AT 1124   Glucose, Bld 159 (H) 70 - 99 mg/dL   Calcium, Ion 8.75 (L) 1.15 - 1.40 mmol/L   TCO2 22 22 - 32 mmol/L   Hemoglobin 14.6 13.0 - 17.0 g/dL    Comment: QA FLAGS AND/OR RANGES MODIFIED  BY DEMOGRAPHIC UPDATE ON 10/15 AT 1124   HCT 43.0 39.0 - 52.0 %    Comment: QA FLAGS AND/OR RANGES MODIFIED BY DEMOGRAPHIC UPDATE ON 10/15 AT 1124    Dg Pelvis Portable  Result Date: 11/29/2017 CLINICAL DATA:  42 year old male status post blunt trauma, struck by tree. EXAM: PORTABLE PELVIS 1-2 VIEWS COMPARISON:  None. FINDINGS: Portable AP view at 1054 hours. Highly comminuted fracture of the proximal left femur beginning at the proximal shaft just distal to the intertrochanteric segment. Displaced butterfly fragments. Proximal to the shaft the left femur appears intact. The left femoral head is normally located. Grossly intact proximal right femur. No pelvis fracture identified. Negative visible bowel gas pattern. IMPRESSION: 1. Partially visible highly comminuted left femoral shaft fracture. The proximal left femur to the intertrochanteric segment appears intact. 2. No superimposed pelvis fracture. Electronically Signed   By: Odessa Fleming M.D.   On: 11/29/2017 11:17   Dg Chest Port 1  View  Result Date: 11/29/2017 CLINICAL DATA:  42 year old male status post blunt trauma, struck by tree. Chest pain. EXAM: PORTABLE CHEST 1 VIEW COMPARISON:  None. FINDINGS: Portable AP supine views are provided at both 1054 and 1111 hours. Suspicion of paraseptal emphysema in both lungs. There is a lack of lung markings in the right apex which could reflect a giant bulla or pneumothorax. There is associated vague right infrahilar opacity suggesting atelectasis. No definite pneumothorax on the left. No pleural effusion identified. Mediastinal contours remain within normal limits. On the 2nd film at 1111 hours and endotracheal tube has been placed with tip at the level of clavicles. No acute osseous abnormality identified. IMPRESSION: 1. Intubated on the 2nd film at 1111 hours with endotracheal tube tip at the level the clavicles. 2. Emphysema suspected with large Bulla versus Pneumothorax in the right apex. Mild right lung atelectasis suspected. 3. No other acute traumatic injury identified. Electronically Signed   By: Odessa Fleming M.D.   On: 11/29/2017 11:20    Review of Systems  Unable to perform ROS: Severity of pain  Gastrointestinal: Positive for abdominal pain.  Musculoskeletal: Positive for joint pain (Left hip, right calf).   Blood pressure 124/78, pulse 96, temperature (!) 96.6 F (35.9 C), temperature source Temporal, resp. rate 18, height 6\' 2"  (1.88 m), weight 99.8 kg, SpO2 98 %. Physical Exam  Constitutional: He appears well-developed and well-nourished. No distress.  HENT:  Head: Normocephalic.  Eyes: Conjunctivae are normal. Right eye exhibits no discharge. Left eye exhibits no discharge. No scleral icterus.  Neck:  C-collar  Cardiovascular: Regular rhythm. Tachycardia present.  Respiratory: Effort normal. No respiratory distress.  Musculoskeletal:  Bilateral shoulder, elbow, wrist, digits- no skin wounds, no instability, no blocks to motion  Sens  Ax/R/M/U could not assess  Mot    Ax/ R/ PIN/ M/ AIN/ U  could not assess  Rad 2+  Pelvis--no traumatic wounds or rash, no ecchymosis, stable to manual stress, TTP left hip  RLE No ecchymosis or rash  Wound lateral mid lower leg, TTP  No knee or ankle effusion  Knee stable to varus/ valgus and anterior/posterior stress  Sens DPN, SPN, TN intact  Motor EHL, ext, flex, evers 5/5  DP 2+, PT 2+, No significant edema  LLE Small abrasion medial ankle, no ecchymosis or rash  Hip severe TTP, pain with movement  No knee effusion, ankle swollen  Knee stable to varus/ valgus and anterior/posterior stress  Sens DPN, SPN, TN intact  Motor EHL, ext, flex, evers  5/5  DP 2+, PT 2+   Neurological: He is alert.  Skin: Skin is warm and dry. He is not diaphoretic.  Psychiatric: He has a normal mood and affect. His behavior is normal.    Assessment/Plan: Hit by tree Left subtroch femur fx -- Plan IMN by Dr. Carola Frost Right open fibula fx -- I&D by Dr. Carola Frost Scalp/right ear lac -- per trauma Other injuries possible, CT pending at time of note    Freeman Caldron, PA-C Orthopedic Surgery [phone] 11/29/2017, 11:29 AM

## 2017-11-29 NOTE — ED Notes (Signed)
Fast Exam neg. Per Dr. Janee Morn

## 2017-11-29 NOTE — Transfer of Care (Signed)
Immediate Anesthesia Transfer of Care Note  Patient: Willie Blevins  Procedure(s) Performed: INTRAMEDULLARY (IM) NAIL INTERTROCHANTRIC (Left Leg Upper) IRRIGATION AND DEBRIDEMENT EXTREMITY (Right Leg Lower) APPLICATION OF WOUND VAC (Right Leg Lower) ear LACERATION REPAIR (Left Ear)  Patient Location: ICU  Anesthesia Type:General  Level of Consciousness: sedated and unresponsive  Airway & Oxygen Therapy: Patient remains intubated per anesthesia plan and Patient placed on Ventilator (see vital sign flow sheet for setting)  Post-op Assessment: Report given to RN and Post -op Vital signs reviewed and stable  Post vital signs: Reviewed and stable  Last Vitals:  Vitals Value Taken Time  BP 106/57 11/29/2017  3:59 PM  Temp    Pulse 74 11/29/2017  3:59 PM  Resp 16 11/29/2017  3:59 PM  SpO2 100 % 11/29/2017  3:59 PM    Last Pain:  Vitals:   11/29/17 1112  TempSrc:   PainSc: 10-Worst pain ever         Complications: No apparent anesthesia complications

## 2017-11-29 NOTE — Progress Notes (Signed)
CSW responded to level 1 trauma. CSW was unable to speak with pt as pt receiving care. CSW was updated that family arrived for pt and was taken to room to gather updates from MD. CSW will continue to follow for further needs at this time.    Claude Manges Cecilio Ohlrich, MSW, LCSW-A Emergency Department Clinical Social Worker [phone]

## 2017-11-29 NOTE — Progress Notes (Signed)
Patient ID: Willie Blevins, male   DOB: 10-06-1975, 42 y.o.   MRN: [id number] Discussed mandible FX with Dr. Jearld Fenton at bedside. Violeta Gelinas, MD, MPH, FACS Trauma: [phone] General Surgery: [phone]

## 2017-11-29 NOTE — Progress Notes (Signed)
Pt anisicoric. Held propofol to examine pt but BP elevating. Discussed with trauma. Will use fentanyl and propofol for comfort- do not hold for WUA at this time.

## 2017-11-29 NOTE — ED Provider Notes (Signed)
[facility] EMERGENCY DEPARTMENT Provider Note   CSN: [id number] Arrival date & time: 11/29/17  1100     History   Chief Complaint Chief Complaint  Patient presents with  . Trauma    HPI Willie Blevins is a 42 y.o. male.  HPI Patient came in as a level 1 trauma.  He was cutting down a tree when it fell onto him.  Hit him in the head and lower extremities.  Hypotensive for EMS.  Complaining of severe pain in his left hip.  Patient states he has a penicillin allergy and has a bad long on the right side.  States he had one beer last night.  Patient is former smoker quit a year ago.   No past medical history on file.  Patient Active Problem List   Diagnosis Date Noted  . Closed left subtrochanteric femur fracture ([place]) 11/29/2017          Home Medications    Prior to Admission medications   Not on File    Family History No family history on file.  Social History Social History   Tobacco Use  . Smoking status: Not on file  Substance Use Topics  . Alcohol use: Not on file  . Drug use: Not on file     Allergies   Patient has no allergy information on record.   Review of Systems Review of Systems  Constitutional: Negative for fatigue.  HENT: Negative for congestion.   Respiratory: Negative for shortness of breath.   Cardiovascular: Negative for chest pain and palpitations.  Gastrointestinal: Negative for abdominal pain.  Musculoskeletal:       Left lower extremity and right lower leg pain.  Skin: Positive for wound.  Neurological: Negative for weakness.  Hematological: Negative for adenopathy.  Psychiatric/Behavioral: Negative for confusion.     Physical Exam Updated Vital Signs BP (!) 144/105   Pulse 96   Temp (!) 96.6 F (35.9 C) (Temporal)   Resp 14   Ht _0  (1.88 m)   Wt 99.8 kg   SpO2 100%   BMI 28.25 kg/m   Physical Exam  Constitutional: He is oriented to person, place, and time. He appears well-developed.    HENT:  Some periorbital ecchymosis above left eye.  Dried blood in bilateral nares.  Face stable.  Poor dentition.  Eyes: Pupils are equal, round, and reactive to light.  Neck:  Cervical collar in place without midline tenderness.  Cardiovascular: Normal rate.   Ecchymosis/abrasion right chest wall.  Pulmonary/Chest:  Ecchymosis to chest wall.  Equal breath sounds bilaterally.  Abdominal: There is no tenderness.  Abrasion to right flank.  Musculoskeletal: He exhibits tenderness.  Deformity of right hip area with left lower extremity rotated externally.  Abrasion/ecchymosis to medial right ankle.  Open laceration with exposed bone on right lateral lower leg.  Neurological: He is alert and oriented to person, place, and time.  Skin: Skin is warm.     ED Treatments / Results  Labs (all labs ordered are listed, but only abnormal results are displayed) Labs Reviewed  COMPREHENSIVE METABOLIC PANEL - Abnormal; Notable for the following components:      Result Value   CO2 19 (*)    Glucose, Bld 164 (*)    Calcium 8.0 (*)    Total Protein 5.8 (*)    Albumin 3.3 (*)    AST 118 (*)    ALT 112 (*)    All other components within normal limits  CBC WITH DIFFERENTIAL/PLATELET - Abnormal; Notable for the following components:   WBC 19.6 (*)    Neutro Abs 14.5 (*)    Abs Immature Granulocytes 0.17 (*)    All other components within normal limits  TRIGLYCERIDES - Abnormal; Notable for the following components:   Triglycerides 198 (*)    All other components within normal limits  CK - Abnormal; Notable for the following components:   Total CK 1,815 (*)    All other components within normal limits  I-STAT CG4 LACTIC ACID, ED - Abnormal; Notable for the following components:   Lactic Acid, Venous 3.13 (*)    All other components within normal limits  I-STAT CHEM 8, ED - Abnormal; Notable for the following components:   Potassium 5.3 (*)    BUN 27 (*)    Glucose, Bld 159 (*)    Calcium,  Ion 0.95 (*)    All other components within normal limits  POCT I-STAT 7, (LYTES, BLD GAS, ICA,H+H) - Abnormal; Notable for the following components:   pH, Arterial 7.280 (*)    Acid-base deficit 6.0 (*)    Calcium, Ion 1.14 (*)    HCT 36.0 (*)    Hemoglobin 12.2 (*)    All other components within normal limits  POCT I-STAT 7, (LYTES, BLD GAS, ICA,H+H) - Abnormal; Notable for the following components:   pH, Arterial 7.339 (*)    Acid-base deficit 5.0 (*)    Calcium, Ion 1.11 (*)    HCT 29.0 (*)    Hemoglobin 9.9 (*)    All other components within normal limits  ETHANOL  PROTIME-INR  LACTIC ACID, PLASMA  URINALYSIS, ROUTINE W REFLEX MICROSCOPIC  HIV ANTIBODY (ROUTINE TESTING W REFLEX)  TRIGLYCERIDES  RAPID URINE DRUG SCREEN, HOSP PERFORMED  I-STAT CG4 LACTIC ACID, ED  I-STAT CG4 LACTIC ACID, ED  TYPE AND SCREEN  PREPARE FRESH FROZEN PLASMA  ABO/RH  PREPARE RBC (CROSSMATCH)    EKG None  Radiology Dg Tibia/fibula Right  Result Date: 11/29/2017 CLINICAL DATA:  Patient struck by falling tree. Open wound of the right leg EXAM: RIGHT TIBIA AND FIBULA - 2 VIEW COMPARISON:  None. FINDINGS: Soft tissue wound along the posterolateral aspect of the right leg. Soft tissue emphysema around the wound. Comminuted fracture of the proximal-mid fibular diaphysis with 8 mm of distal displacement of the major fracture fragment. No other fracture or dislocation. IMPRESSION: 1. Open comminuted fracture of the proximal-mid fibular diaphysis. Electronically Signed   By: Kathreen Devoid   On: 11/29/2017 11:40   Ct Head Wo Contrast  Result Date: 11/29/2017 CLINICAL DATA:  Trauma with facial fracture suspected EXAM: CT HEAD WITHOUT CONTRAST CT MAXILLOFACIAL WITHOUT CONTRAST CT CERVICAL SPINE WITHOUT CONTRAST TECHNIQUE: Multidetector CT imaging of the head, cervical spine, and maxillofacial structures were performed using the standard protocol without intravenous contrast. Multiplanar CT image  reconstructions of the cervical spine and maxillofacial structures were also generated. COMPARISON:  None. FINDINGS: CT HEAD FINDINGS Brain: No evidence of acute infarction, hemorrhage, hydrocephalus, extra-axial collection or mass lesion/mass effect. Vascular: Negative Skull: Facial fractures and skull base fractures described below. CT MAXILLOFACIAL FINDINGS Osseous: Nondisplaced right anterior mandible fracture extending between the canine and first premolar roots. No additional mandibular fracture is seen. No mandibular dislocation. Tripod fracture on the right with anterior and posterior walls right maxillary sinus, right orbital floor, lateral right orbit wall. The fracture complexes incomplete at the level of the zygomatic arch. There is mild depression of the right zygoma compared  to the left. Right temporal bone fracture with hemotympanum. Fracture appears to spare the otic capsule. There is disruption of the malleoincudal joint. Central skull base fracture traversing the pterygoid bodies and sphenoid sinuses. Posterior nasal septum shows segmental fracturing. Best seen on coronal reformats there is fracture offset along the right foramen rotundum. No carotid canal involvement. Right orbital roof fracture.  Small volume pneumocephalus. Orbits: Soft tissue gas from sinus fracturing on the right where there is also mild extraconal stranding. No hematoma or worrisome proptosis. Sinuses: Patchy hemosinus. Soft tissues: Facial contusion.  No opaque foreign body. CT CERVICAL SPINE FINDINGS Alignment: No traumatic malalignment Skull base and vertebrae: Negative for fracture Soft tissues and spinal canal: No prevertebral fluid or swelling. No visible canal hematoma. Disc levels:  Negative Upper chest: Reported separately. IMPRESSION: Maxillofacial CT: 1. Nondisplaced right anterior mandible fracture. 2. Tripod fracture on the right that is incomplete at the zygomatic arch. 3. Central skull base fracturing involving  the sphenoid sinuses. This fracture is displaced at the right foramen rotundum. 4. Right temporal bone fracture with malleo-incudal subluxation. Head CT: 1. Negative for intracranial hemorrhage or visible contusion. 2. Small volume pneumocephalus. Cervical spine CT: No evidence of injury. Electronically Signed   By: Monte Fantasia M.D.   On: 11/29/2017 12:39   Ct Chest W Contrast  Result Date: 11/29/2017 CLINICAL DATA:  Hit by tree, blunt trauma EXAM: CT CHEST, ABDOMEN, AND PELVIS WITH CONTRAST TECHNIQUE: Multidetector CT imaging of the chest, abdomen and pelvis was performed following the standard protocol during bolus administration of intravenous contrast. CONTRAST:  165m OMNIPAQUE IOHEXOL 300 MG/ML  SOLN COMPARISON:  None. FINDINGS: CT CHEST FINDINGS Cardiovascular: Heart size normal. No pericardial effusion. No thoracic aortic aneurysm. Mediastinum/Nodes: No mediastinal hematoma. No hilar or mediastinal adenopathy. Endotracheal tube projects in expected location. Lungs/Pleura: No pneumothorax. No pleural effusion. Advanced bullous emphysema most marked in the apices. Consolidation/atelectasis posteriorly in both lungs. Musculoskeletal: Minimally displaced fracture, posterior aspect left eleventh rib. CT ABDOMEN PELVIS FINDINGS Hepatobiliary: 4.7 cm linear laceration in hepatic segment 7, extending to the capsule medially just posterior to the IVC. No definite active extravasation. Gallbladder unremarkable. Pancreas: Unremarkable. No pancreatic ductal dilatation or surrounding inflammatory changes. Spleen: 4.5 cm laceration in the cephalad aspect of the spine bleeding extending towards the posterior capsule without definite capsular injury. There is no perisplenic hematoma. No active extravasation evident. Adrenals/Urinary Tract: 2.8 cm right adrenal hemorrhage extending into the posterior retroperitoneum with a small amount of extravasation evident on delayed scan. Left adrenal unremarkable. Kidneys  unremarkable. No hydronephrosis. Urinary bladder nondistended. Stomach/Bowel: Physiologic distention of stomach with fluid and gas. The small bowel is nondilated. Normal appendix. Colon decompressed, unremarkable. Vascular/Lymphatic: No significant vascular findings are present. No enlarged abdominal or pelvic lymph nodes. Reproductive: Prostate is unremarkable. Other: No ascites.  No free air. Musculoskeletal: Lumbar spine intact. Bony pelvis intact. Bilateral hips unremarkable. Comminuted sub trochanteric fracture of the left femoral shaft, incompletely visualized. IMPRESSION: 1. 4.7 cm linear laceration hepatic segment 7 without extracapsular hematoma or active extravasation. 2. 2.8 cm right adrenal hemorrhage with some extravasation evident on delayed scan. 3. 4.5 cm splenic laceration without extracapsular hematoma or active extravasation. 4. Minimally displaced left eleventh rib fracture; no pneumothorax. 5. Comminuted proximal left femoral shaft subtrochanteric fracture, incompletely visualized. 6. Endotracheal tube in good position. 7. Advanced pulmonary bullous emphysema. Critical Value/emergent results were reviewed in person at the time of interpretation on 11/29/2017 at 12:33 pm with Dr. TGrandville Silos, who verbally acknowledged these  results. Electronically Signed   By: Lucrezia Europe M.D.   On: 11/29/2017 12:34   Ct Cervical Spine Wo Contrast  Result Date: 11/29/2017 CLINICAL DATA:  Trauma with facial fracture suspected EXAM: CT HEAD WITHOUT CONTRAST CT MAXILLOFACIAL WITHOUT CONTRAST CT CERVICAL SPINE WITHOUT CONTRAST TECHNIQUE: Multidetector CT imaging of the head, cervical spine, and maxillofacial structures were performed using the standard protocol without intravenous contrast. Multiplanar CT image reconstructions of the cervical spine and maxillofacial structures were also generated. COMPARISON:  None. FINDINGS: CT HEAD FINDINGS Brain: No evidence of acute infarction, hemorrhage, hydrocephalus,  extra-axial collection or mass lesion/mass effect. Vascular: Negative Skull: Facial fractures and skull base fractures described below. CT MAXILLOFACIAL FINDINGS Osseous: Nondisplaced right anterior mandible fracture extending between the canine and first premolar roots. No additional mandibular fracture is seen. No mandibular dislocation. Tripod fracture on the right with anterior and posterior walls right maxillary sinus, right orbital floor, lateral right orbit wall. The fracture complexes incomplete at the level of the zygomatic arch. There is mild depression of the right zygoma compared to the left. Right temporal bone fracture with hemotympanum. Fracture appears to spare the otic capsule. There is disruption of the malleoincudal joint. Central skull base fracture traversing the pterygoid bodies and sphenoid sinuses. Posterior nasal septum shows segmental fracturing. Best seen on coronal reformats there is fracture offset along the right foramen rotundum. No carotid canal involvement. Right orbital roof fracture.  Small volume pneumocephalus. Orbits: Soft tissue gas from sinus fracturing on the right where there is also mild extraconal stranding. No hematoma or worrisome proptosis. Sinuses: Patchy hemosinus. Soft tissues: Facial contusion.  No opaque foreign body. CT CERVICAL SPINE FINDINGS Alignment: No traumatic malalignment Skull base and vertebrae: Negative for fracture Soft tissues and spinal canal: No prevertebral fluid or swelling. No visible canal hematoma. Disc levels:  Negative Upper chest: Reported separately. IMPRESSION: Maxillofacial CT: 1. Nondisplaced right anterior mandible fracture. 2. Tripod fracture on the right that is incomplete at the zygomatic arch. 3. Central skull base fracturing involving the sphenoid sinuses. This fracture is displaced at the right foramen rotundum. 4. Right temporal bone fracture with malleo-incudal subluxation. Head CT: 1. Negative for intracranial hemorrhage or  visible contusion. 2. Small volume pneumocephalus. Cervical spine CT: No evidence of injury. Electronically Signed   By: Monte Fantasia M.D.   On: 11/29/2017 12:39   Ct Abdomen Pelvis W Contrast  Result Date: 11/29/2017 CLINICAL DATA:  Hit by tree, blunt trauma EXAM: CT CHEST, ABDOMEN, AND PELVIS WITH CONTRAST TECHNIQUE: Multidetector CT imaging of the chest, abdomen and pelvis was performed following the standard protocol during bolus administration of intravenous contrast. CONTRAST:  115m OMNIPAQUE IOHEXOL 300 MG/ML  SOLN COMPARISON:  None. FINDINGS: CT CHEST FINDINGS Cardiovascular: Heart size normal. No pericardial effusion. No thoracic aortic aneurysm. Mediastinum/Nodes: No mediastinal hematoma. No hilar or mediastinal adenopathy. Endotracheal tube projects in expected location. Lungs/Pleura: No pneumothorax. No pleural effusion. Advanced bullous emphysema most marked in the apices. Consolidation/atelectasis posteriorly in both lungs. Musculoskeletal: Minimally displaced fracture, posterior aspect left eleventh rib. CT ABDOMEN PELVIS FINDINGS Hepatobiliary: 4.7 cm linear laceration in hepatic segment 7, extending to the capsule medially just posterior to the IVC. No definite active extravasation. Gallbladder unremarkable. Pancreas: Unremarkable. No pancreatic ductal dilatation or surrounding inflammatory changes. Spleen: 4.5 cm laceration in the cephalad aspect of the spine bleeding extending towards the posterior capsule without definite capsular injury. There is no perisplenic hematoma. No active extravasation evident. Adrenals/Urinary Tract: 2.8 cm right adrenal hemorrhage extending into  the posterior retroperitoneum with a small amount of extravasation evident on delayed scan. Left adrenal unremarkable. Kidneys unremarkable. No hydronephrosis. Urinary bladder nondistended. Stomach/Bowel: Physiologic distention of stomach with fluid and gas. The small bowel is nondilated. Normal appendix. Colon  decompressed, unremarkable. Vascular/Lymphatic: No significant vascular findings are present. No enlarged abdominal or pelvic lymph nodes. Reproductive: Prostate is unremarkable. Other: No ascites.  No free air. Musculoskeletal: Lumbar spine intact. Bony pelvis intact. Bilateral hips unremarkable. Comminuted sub trochanteric fracture of the left femoral shaft, incompletely visualized. IMPRESSION: 1. 4.7 cm linear laceration hepatic segment 7 without extracapsular hematoma or active extravasation. 2. 2.8 cm right adrenal hemorrhage with some extravasation evident on delayed scan. 3. 4.5 cm splenic laceration without extracapsular hematoma or active extravasation. 4. Minimally displaced left eleventh rib fracture; no pneumothorax. 5. Comminuted proximal left femoral shaft subtrochanteric fracture, incompletely visualized. 6. Endotracheal tube in good position. 7. Advanced pulmonary bullous emphysema. Critical Value/emergent results were reviewed in person at the time of interpretation on 11/29/2017 at 12:33 pm with Dr. Grandville Silos , who verbally acknowledged these results. Electronically Signed   By: Lucrezia Europe M.D.   On: 11/29/2017 12:34   Dg Pelvis Portable  Addendum Date: 11/29/2017   ADDENDUM REPORT: 11/29/2017 11:33 ADDENDUM: Study discussed by telephone with Dr. Davonna Belling. And additionally, the clinical data should state "42 year old MALE". Electronically Signed   By: Genevie Ann M.D.   On: 11/29/2017 11:33   Result Date: 11/29/2017 CLINICAL DATA:  42 year old male status post blunt trauma, struck by tree. EXAM: PORTABLE PELVIS 1-2 VIEWS COMPARISON:  None. FINDINGS: Portable AP view at 1054 hours. Highly comminuted fracture of the proximal left femur beginning at the proximal shaft just distal to the intertrochanteric segment. Displaced butterfly fragments. Proximal to the shaft the left femur appears intact. The left femoral head is normally located. Grossly intact proximal right femur. No pelvis  fracture identified. Negative visible bowel gas pattern. IMPRESSION: 1. Partially visible highly comminuted left femoral shaft fracture. The proximal left femur to the intertrochanteric segment appears intact. 2. No superimposed pelvis fracture. Electronically Signed: By: Genevie Ann M.D. On: 11/29/2017 11:17   Dg Chest Port 1 View  Addendum Date: 11/29/2017   ADDENDUM REPORT: 11/29/2017 11:32 ADDENDUM: Study discussed by telephone with Dr. Davonna Belling on 11/29/2017 at 1125 hours. We favor bullous emphysema in the right upper lung, he reports the patient described a history of significant right lung emphysema. Chest CT is pending at this time. The Additionally, the clinical data should state "42 year old MALE". Electronically Signed   By: Genevie Ann M.D.   On: 11/29/2017 11:32   Result Date: 11/29/2017 CLINICAL DATA:  42 year old male status post blunt trauma, struck by tree. Chest pain. EXAM: PORTABLE CHEST 1 VIEW COMPARISON:  None. FINDINGS: Portable AP supine views are provided at both 1054 and 1111 hours. Suspicion of paraseptal emphysema in both lungs. There is a lack of lung markings in the right apex which could reflect a giant bulla or pneumothorax. There is associated vague right infrahilar opacity suggesting atelectasis. No definite pneumothorax on the left. No pleural effusion identified. Mediastinal contours remain within normal limits. On the 2nd film at 1111 hours and endotracheal tube has been placed with tip at the level of clavicles. No acute osseous abnormality identified. IMPRESSION: 1. Intubated on the 2nd film at 1111 hours with endotracheal tube tip at the level the clavicles. 2. Emphysema suspected with large Bulla versus Pneumothorax in the right apex. Mild right lung atelectasis suspected. 3.  No other acute traumatic injury identified. Electronically Signed: By: Genevie Ann M.D. On: 11/29/2017 11:20   Dg Ankle Left Port  Result Date: 11/29/2017 CLINICAL DATA:  Left ankle pain and  swelling EXAM: PORTABLE LEFT ANKLE - 2 VIEW COMPARISON:  None. FINDINGS: There is no evidence of fracture, dislocation, or joint effusion. There is no evidence of arthropathy or other focal bone abnormality. Soft tissues are unremarkable. IMPRESSION: Negative. Electronically Signed   By: Kathreen Devoid   On: 11/29/2017 11:38   Dg Femur Portable 1 View Left  Result Date: 11/29/2017 CLINICAL DATA:  Hit by falling tree. EXAM: LEFT FEMUR PORTABLE 1 VIEW COMPARISON:  None. FINDINGS: There is a comminuted displaced fracture through the proximal to midshaft of the left femur. The distal fragment is displaced greater than 1 shaft with laterally. No visible hip dislocation or subluxation. IMPRESSION: Comminuted, displaced proximal to mid left femoral fracture. Electronically Signed   By: Rolm Baptise M.D.   On: 11/29/2017 11:40   Ct Maxillofacial Wo Contrast  Result Date: 11/29/2017 CLINICAL DATA:  Trauma with facial fracture suspected EXAM: CT HEAD WITHOUT CONTRAST CT MAXILLOFACIAL WITHOUT CONTRAST CT CERVICAL SPINE WITHOUT CONTRAST TECHNIQUE: Multidetector CT imaging of the head, cervical spine, and maxillofacial structures were performed using the standard protocol without intravenous contrast. Multiplanar CT image reconstructions of the cervical spine and maxillofacial structures were also generated. COMPARISON:  None. FINDINGS: CT HEAD FINDINGS Brain: No evidence of acute infarction, hemorrhage, hydrocephalus, extra-axial collection or mass lesion/mass effect. Vascular: Negative Skull: Facial fractures and skull base fractures described below. CT MAXILLOFACIAL FINDINGS Osseous: Nondisplaced right anterior mandible fracture extending between the canine and first premolar roots. No additional mandibular fracture is seen. No mandibular dislocation. Tripod fracture on the right with anterior and posterior walls right maxillary sinus, right orbital floor, lateral right orbit wall. The fracture complexes incomplete at  the level of the zygomatic arch. There is mild depression of the right zygoma compared to the left. Right temporal bone fracture with hemotympanum. Fracture appears to spare the otic capsule. There is disruption of the malleoincudal joint. Central skull base fracture traversing the pterygoid bodies and sphenoid sinuses. Posterior nasal septum shows segmental fracturing. Best seen on coronal reformats there is fracture offset along the right foramen rotundum. No carotid canal involvement. Right orbital roof fracture.  Small volume pneumocephalus. Orbits: Soft tissue gas from sinus fracturing on the right where there is also mild extraconal stranding. No hematoma or worrisome proptosis. Sinuses: Patchy hemosinus. Soft tissues: Facial contusion.  No opaque foreign body. CT CERVICAL SPINE FINDINGS Alignment: No traumatic malalignment Skull base and vertebrae: Negative for fracture Soft tissues and spinal canal: No prevertebral fluid or swelling. No visible canal hematoma. Disc levels:  Negative Upper chest: Reported separately. IMPRESSION: Maxillofacial CT: 1. Nondisplaced right anterior mandible fracture. 2. Tripod fracture on the right that is incomplete at the zygomatic arch. 3. Central skull base fracturing involving the sphenoid sinuses. This fracture is displaced at the right foramen rotundum. 4. Right temporal bone fracture with malleo-incudal subluxation. Head CT: 1. Negative for intracranial hemorrhage or visible contusion. 2. Small volume pneumocephalus. Cervical spine CT: No evidence of injury. Electronically Signed   By: Monte Fantasia M.D.   On: 11/29/2017 12:39    Procedures Procedure Name: Intubation Date/Time: 11/29/2017 3:36 PM Performed by: Davonna Belling, MD Pre-anesthesia Checklist: Patient identified Oxygen Delivery Method: Non-rebreather mask Preoxygenation: Pre-oxygenation with 100% oxygen Induction Type: Rapid sequence Ventilation: Mask ventilation without difficulty Laryngoscope  Size: Glidescope and  3 Tube type: Subglottic suction tube Tube size: 7.5 mm Placement Confirmation: ETT inserted through vocal cords under direct vision,  CO2 detector and Breath sounds checked- equal and bilateral Secured at: 23 cm Tube secured with: ETT holder Dental Injury: Teeth and Oropharynx as per pre-operative assessment       (including critical care time)  Medications Ordered in ED Medications  propofol (DIPRIVAN) 1000 MG/100ML infusion (50 mcg/kg/min  99.8 kg Intravenous Rate/Dose Change 11/29/17 1522)  midazolam (VERSED) injection 2 mg ( Intravenous MAR Hold 11/29/17 1255)  sodium chloride irrigation 0.9 % (3,000 mLs Irrigation Given 11/29/17 1316)  0.9 % irrigation (POUR BTL) (1,000 mLs Irrigation Given 11/29/17 1316)  tobramycin (NEBCIN) powder (1.2 g Topical Given 11/29/17 1327)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

## 2017-11-29 NOTE — Consult Note (Signed)
Reason for Consult:facial fractures Referring Physician: Trauma  Willie Blevins is an 42 y.o. male.  HPI: 42 year old who was injured block cutting a tree down. Apparently was pinned under the tree. A large piece of equipment had to move the tree off of him. Apparently by history of the trauma surgeon and the family the facial nerve was not weak or out at the time of his presentation. He was talking and awake. He was intubated subsequently. He has been taken to the operating room by or so and had a leg fracture explored. He has an open wound. He is due to go back to the operating room on Thursday for more work on his leg.  No past medical history on file.    No family history on file.  Social History:  has no tobacco, alcohol, and drug history on file.  Allergies:  Allergies  Allergen Reactions  . Penicillins Hives    Medications: I have reviewed the patient's current medications.  Results for orders placed or performed during the hospital encounter of 11/29/17 (from the past 48 hour(s))  Prepare fresh frozen plasma     Status: None   Collection Time: 11/29/17 10:49 AM  Result Value Ref Range   Unit Number [id number]    Blood Component Type LIQ PLASMA    Unit division 00    Status of Unit REL FROM [facility]    Unit tag comment EMERGENCY RELEASE    Transfusion Status      OK TO TRANSFUSE Performed at [facility], [address] [place], [place] [address]    Unit Number [id number]    Blood Component Type LIQ PLASMA    Unit division 00    Status of Unit REL FROM [place]    Unit tag comment EMERGENCY RELEASE    Transfusion Status OK TO TRANSFUSE   Type and screen Ordered by PROVIDER DEFAULT     Status: None (Preliminary result)   Collection Time: 11/29/17 11:09 AM  Result Value Ref Range   ABO/RH(D) B NEG    Antibody Screen NEG    Sample Expiration 12/02/2017    Unit Number [id number]    Blood Component Type RBC LR PHER1    Unit division 00    Status of Unit  REL FROM [facility]    Unit tag comment EMERGENCY RELEASE    Transfusion Status OK TO TRANSFUSE    Crossmatch Result NOT NEEDED    Unit Number [id number]    Blood Component Type RED CELLS,LR    Unit division 00    Status of Unit REL FROM [facility]    Unit tag comment EMERGENCY RELEASE    Transfusion Status OK TO TRANSFUSE    Crossmatch Result NOT NEEDED    Unit Number [id number]    Blood Component Type RED CELLS,LR    Unit division 00    Status of Unit ISSUED    Transfusion Status OK TO TRANSFUSE    Crossmatch Result Compatible    Unit Number [id number]    Blood Component Type RED CELLS,LR    Unit division 00    Status of Unit ALLOCATED    Transfusion Status OK TO TRANSFUSE    Crossmatch Result      Compatible Performed at [facility], [place] [address], [place], [place] [address]   ABO/Rh     Status: None   Collection Time: 11/29/17 11:09 AM  Result Value Ref Range   ABO/RH(D)      B NEG  Performed at [facility], [place] [address], [place], [place] [address]   I-Stat CG4 Lactic Acid, ED     Status: Abnormal   Collection Time: 11/29/17 11:17 AM  Result Value Ref Range   Lactic Acid, Venous 3.13 (HH) 0.5 - 1.9 mmol/L   Comment NOTIFIED PHYSICIAN   I-stat chem 8, ed     Status: Abnormal   Collection Time: 11/29/17 11:17 AM  Result Value Ref Range   Sodium 137 135 - 145 mmol/L   Potassium 5.3 (H) 3.5 - 5.1 mmol/L   Chloride 107 98 - 111 mmol/L   BUN 27 (H) 6 - 20 mg/dL   Creatinine, Ser 1.10 0.61 - 1.24 mg/dL    Comment: QA FLAGS AND/OR RANGES MODIFIED BY DEMOGRAPHIC UPDATE ON 10/15 AT 1124   Glucose, Bld 159 (H) 70 - 99 mg/dL   Calcium, Ion 0.95 (L) 1.15 - 1.40 mmol/L   TCO2 22 22 - 32 mmol/L   Hemoglobin 14.6 13.0 - 17.0 g/dL    Comment: QA FLAGS AND/OR RANGES MODIFIED BY DEMOGRAPHIC UPDATE ON 10/15 AT 1124   HCT 43.0 39.0 - 52.0 %    Comment: QA FLAGS AND/OR RANGES MODIFIED BY DEMOGRAPHIC UPDATE ON 10/15 AT 1124  Comprehensive metabolic panel     Status:  Abnormal   Collection Time: 11/29/17 11:21 AM  Result Value Ref Range   Sodium 138 135 - 145 mmol/L   Potassium 3.8 3.5 - 5.1 mmol/L   Chloride 109 98 - 111 mmol/L   CO2 19 (L) 22 - 32 mmol/L   Glucose, Bld 164 (H) 70 - 99 mg/dL   BUN 16 6 - 20 mg/dL   Creatinine, Ser 1.16 0.61 - 1.24 mg/dL   Calcium 8.0 (L) 8.9 - 10.3 mg/dL   Total Protein 5.8 (L) 6.5 - 8.1 g/dL   Albumin 3.3 (L) 3.5 - 5.0 g/dL   AST 118 (H) 15 - 41 U/L   ALT 112 (H) 0 - 44 U/L   Alkaline Phosphatase 75 38 - 126 U/L   Total Bilirubin 0.5 0.3 - 1.2 mg/dL   GFR calc non Af Amer >60 >60 mL/min   GFR calc Af Amer >60 >60 mL/min    Comment: (NOTE) The eGFR has been calculated using the CKD EPI equation. This calculation has not been validated in all clinical situations. eGFR's persistently <60 mL/min signify possible Chronic Kidney Disease.    Anion gap 10 5 - 15    Comment: Performed at [facility] [address], [place], [place] [address]  Ethanol     Status: None   Collection Time: 11/29/17 11:21 AM  Result Value Ref Range   Alcohol, Ethyl (B) <10 <10 mg/dL    Comment: (NOTE) Lowest detectable limit for serum alcohol is 10 mg/dL. For medical purposes only. Performed at [facility], [place] [address], [place],  [address]   CBC with Differential     Status: Abnormal   Collection Time: 11/29/17 11:21 AM  Result Value Ref Range   WBC 19.6 (H) 4.0 - 10.5 K/uL   RBC 5.00 4.22 - 5.81 MIL/uL   Hemoglobin 14.9 13.0 - 17.0 g/dL   HCT 46.0 39.0 - 52.0 %   MCV 92.0 80.0 - 100.0 fL   MCH 29.8 26.0 - 34.0 pg   MCHC 32.4 30.0 - 36.0 g/dL   RDW 13.0 11.5 - 15.5 %   Platelets 230 150 - 400 K/uL   nRBC 0.0 0.0 -  0.2 %   Neutrophils Relative % 74 %   Neutro Abs 14.5 (H) 1.7 - 7.7 K/uL   Lymphocytes Relative 18 %   Lymphs Abs 3.5 0.7 - 4.0 K/uL   Monocytes Relative 5 %   Monocytes Absolute 1.0 0.1 - 1.0 K/uL   Eosinophils Relative 2 %   Eosinophils Absolute 0.4 0.0 - 0.5 K/uL   Basophils  Relative 0 %   Basophils Absolute 0.1 0.0 - 0.1 K/uL   Immature Granulocytes 1 %   Abs Immature Granulocytes 0.17 (H) 0.00 - 0.07 K/uL    Comment: Performed at [facility] [address] [place], [place] [address]  Protime-INR     Status: None   Collection Time: 11/29/17 11:21 AM  Result Value Ref Range   Prothrombin Time 13.9 11.4 - 15.2 seconds   INR 1.08     Comment: Performed at [facility] [address], [place], [place] [address]  Triglycerides     Status: Abnormal   Collection Time: 11/29/17 11:23 AM  Result Value Ref Range   Triglycerides 198 (H) <150 mg/dL    Comment: Performed at [facility] [address], [place], [place] [address]  I-STAT 7, (LYTES, BLD GAS, ICA, H+H)     Status: Abnormal   Collection Time: 11/29/17 12:19 PM  Result Value Ref Range   pH, Arterial 7.250 (L) 7.350 - 7.450   pCO2 arterial 48.8 (H) 32.0 - 48.0 mmHg   pO2, Arterial 139.0 (H) 83.0 - 108.0 mmHg   Bicarbonate 21.4 20.0 - 28.0 mmol/L   TCO2 23 22 - 32 mmol/L   O2 Saturation 99.0 %   Acid-base deficit 6.0 (H) 0.0 - 2.0 mmol/L   Sodium 138 135 - 145 mmol/L   Potassium 3.2 (L) 3.5 - 5.1 mmol/L   Calcium, Ion 1.14 (L) 1.15 - 1.40 mmol/L   HCT 43.0 39.0 - 52.0 %   Hemoglobin 14.6 13.0 - 17.0 g/dL   Patient temperature HIDE    Sample type ARTERIAL   CK     Status: Abnormal   Collection Time: 11/29/17 12:50 PM  Result Value Ref Range   Total CK 1,815 (H) 49 - 397 U/L    Comment: Performed at [facility], [place] [address], [place], [place] [address]  Lactic acid, plasma     Status: None   Collection Time: 11/29/17 12:50 PM  Result Value Ref Range   Lactic Acid, Venous 1.4 0.5 - 1.9 mmol/L    Comment: Performed at [facility] [address], [place], [place] [address]  Triglycerides     Status: None   Collection Time: 11/29/17  1:36 PM  Result Value Ref Range   Triglycerides 102 <150 mg/dL    Comment: Performed at [facility] [address], [place], [place] [address]  I-STAT 7, (LYTES, BLD GAS, ICA, H+H)     Status: Abnormal   Collection Time: 11/29/17  1:39 PM  Result Value Ref Range   pH, Arterial 7.280 (L) 7.350 - 7.450   pCO2 arterial 45.0 32.0 - 48.0 mmHg   pO2, Arterial 103.0 83.0 - 108.0 mmHg   Bicarbonate 21.7 20.0 - 28.0 mmol/L   TCO2 23 22 - 32 mmol/L   O2 Saturation 98.0 %   Acid-base deficit 6.0 (H) 0.0 - 2.0 mmol/L   Sodium 140 135 - 145 mmol/L   Potassium 3.8 3.5 - 5.1 mmol/L   Calcium, Ion 1.14 (L)  1.15 - 1.40 mmol/L   HCT 36.0 (L) 39.0 - 52.0 %   Hemoglobin 12.2 (L) 13.0 - 17.0 g/dL   Patient temperature 34.7 C    Sample type ARTERIAL   I-STAT 7, (LYTES, BLD GAS, ICA, H+H)     Status: Abnormal   Collection Time: 11/29/17  2:42 PM  Result Value Ref Range   pH, Arterial 7.339 (L) 7.350 - 7.450   pCO2 arterial 39.1 32.0 - 48.0 mmHg   pO2, Arterial 100.0 83.0 - 108.0 mmHg   Bicarbonate 21.6 20.0 - 28.0 mmol/L   TCO2 23 22 - 32 mmol/L   O2 Saturation 98.0 %   Acid-base deficit 5.0 (H) 0.0 - 2.0 mmol/L   Sodium 138 135 - 145 mmol/L   Potassium 4.2 3.5 - 5.1 mmol/L   Calcium, Ion 1.11 (L) 1.15 - 1.40 mmol/L   HCT 29.0 (L) 39.0 - 52.0 %   Hemoglobin 9.9 (L) 13.0 - 17.0 g/dL   Patient temperature 34.6 C    Sample type ARTERIAL   Prepare RBC     Status: None   Collection Time: 11/29/17  3:05 PM  Result Value Ref Range   Order Confirmation      ORDER PROCESSED BY BLOOD BANK Performed at [facility], [address], [place], [place] [address]     Dg Tibia/fibula Left  Result Date: 11/29/2017 CLINICAL DATA:  Irrigation of right lower extremity wound for open tibial fracture. EXAM: RIGHT TIBIA AND FIBULA - 2 VIEW; DG C-ARM 61-120 MIN; LEFT TIBIA AND FIBULA - 2 VIEW FLUOROSCOPY TIME:  21 seconds. COMPARISON:  Radiographs of same day. FINDINGS: Four intraoperative fluoroscopic images were obtained of the right lower leg. These demonstrate severely comminuted and displaced fracture involving the  midshaft of the right tibia with overlying soft tissue laceration or wound. Three intraoperative fluoroscopic images of the left tibia and fibula demonstrate mildly displaced and comminuted left fibular shaft fracture. IMPRESSION: Fluoroscopic guidance and evaluation was performed of bilateral fibular fractures. Electronically Signed   By: Marijo Conception, M.D.   On: 11/29/2017 16:21   Dg Tibia/fibula Right  Result Date: 11/29/2017 CLINICAL DATA:  Irrigation of right lower extremity wound for open tibial fracture. EXAM: RIGHT TIBIA AND FIBULA - 2 VIEW; DG C-ARM 61-120 MIN; LEFT TIBIA AND FIBULA - 2 VIEW FLUOROSCOPY TIME:  21 seconds. COMPARISON:  Radiographs of same day. FINDINGS: Four intraoperative fluoroscopic images were obtained of the right lower leg. These demonstrate severely comminuted and displaced fracture involving the midshaft of the right tibia with overlying soft tissue laceration or wound. Three intraoperative fluoroscopic images of the left tibia and fibula demonstrate mildly displaced and comminuted left fibular shaft fracture. IMPRESSION: Fluoroscopic guidance and evaluation was performed of bilateral fibular fractures. Electronically Signed   By: Marijo Conception, M.D.   On: 11/29/2017 16:21   Dg Tibia/fibula Right  Result Date: 11/29/2017 CLINICAL DATA:  Patient struck by falling tree. Open wound of the right leg EXAM: RIGHT TIBIA AND FIBULA - 2 VIEW COMPARISON:  None. FINDINGS: Soft tissue wound along the posterolateral aspect of the right leg. Soft tissue emphysema around the wound. Comminuted fracture of the proximal-mid fibular diaphysis with 8 mm of distal displacement of the major fracture fragment. No other fracture or dislocation. IMPRESSION: 1. Open comminuted fracture of the proximal-mid fibular diaphysis. Electronically Signed   By: Kathreen Devoid   On: 11/29/2017 11:40   Ct Head Wo Contrast  Result Date: 11/29/2017 CLINICAL DATA:  Trauma with facial fracture suspected  EXAM: CT HEAD WITHOUT CONTRAST CT MAXILLOFACIAL WITHOUT CONTRAST CT CERVICAL SPINE WITHOUT CONTRAST TECHNIQUE: Multidetector CT imaging of the head, cervical spine, and maxillofacial structures were performed using the standard protocol without intravenous contrast. Multiplanar CT image reconstructions of the cervical spine and maxillofacial structures were also generated. COMPARISON:  None. FINDINGS: CT HEAD FINDINGS Brain: No evidence of acute infarction, hemorrhage, hydrocephalus, extra-axial collection or mass lesion/mass effect. Vascular: Negative Skull: Facial fractures and skull base fractures described below. CT MAXILLOFACIAL FINDINGS Osseous: Nondisplaced right anterior mandible fracture extending between the canine and first premolar roots. No additional mandibular fracture is seen. No mandibular dislocation. Tripod fracture on the right with anterior and posterior walls right maxillary sinus, right orbital floor, lateral right orbit wall. The fracture complexes incomplete at the level of the zygomatic arch. There is mild depression of the right zygoma compared to the left. Right temporal bone fracture with hemotympanum. Fracture appears to spare the otic capsule. There is disruption of the malleoincudal joint. Central skull base fracture traversing the pterygoid bodies and sphenoid sinuses. Posterior nasal septum shows segmental fracturing. Best seen on coronal reformats there is fracture offset along the right foramen rotundum. No carotid canal involvement. Right orbital roof fracture.  Small volume pneumocephalus. Orbits: Soft tissue gas from sinus fracturing on the right where there is also mild extraconal stranding. No hematoma or worrisome proptosis. Sinuses: Patchy hemosinus. Soft tissues: Facial contusion.  No opaque foreign body. CT CERVICAL SPINE FINDINGS Alignment: No traumatic malalignment Skull base and vertebrae: Negative for fracture Soft tissues and spinal canal: No prevertebral fluid or  swelling. No visible canal hematoma. Disc levels:  Negative Upper chest: Reported separately. IMPRESSION: Maxillofacial CT: 1. Nondisplaced right anterior mandible fracture. 2. Tripod fracture on the right that is incomplete at the zygomatic arch. 3. Central skull base fracturing involving the sphenoid sinuses. This fracture is displaced at the right foramen rotundum. 4. Right temporal bone fracture with malleo-incudal subluxation. Head CT: 1. Negative for intracranial hemorrhage or visible contusion. 2. Small volume pneumocephalus. Cervical spine CT: No evidence of injury. Electronically Signed   By: Monte Fantasia M.D.   On: 11/29/2017 12:39   Ct Chest W Contrast  Result Date: 11/29/2017 CLINICAL DATA:  Hit by tree, blunt trauma EXAM: CT CHEST, ABDOMEN, AND PELVIS WITH CONTRAST TECHNIQUE: Multidetector CT imaging of the chest, abdomen and pelvis was performed following the standard protocol during bolus administration of intravenous contrast. CONTRAST:  158m OMNIPAQUE IOHEXOL 300 MG/ML  SOLN COMPARISON:  None. FINDINGS: CT CHEST FINDINGS Cardiovascular: Heart size normal. No pericardial effusion. No thoracic aortic aneurysm. Mediastinum/Nodes: No mediastinal hematoma. No hilar or mediastinal adenopathy. Endotracheal tube projects in expected location. Lungs/Pleura: No pneumothorax. No pleural effusion. Advanced bullous emphysema most marked in the apices. Consolidation/atelectasis posteriorly in both lungs. Musculoskeletal: Minimally displaced fracture, posterior aspect left eleventh rib. CT ABDOMEN PELVIS FINDINGS Hepatobiliary: 4.7 cm linear laceration in hepatic segment 7, extending to the capsule medially just posterior to the IVC. No definite active extravasation. Gallbladder unremarkable. Pancreas: Unremarkable. No pancreatic ductal dilatation or surrounding inflammatory changes. Spleen: 4.5 cm laceration in the cephalad aspect of the spine bleeding extending towards the posterior capsule without  definite capsular injury. There is no perisplenic hematoma. No active extravasation evident. Adrenals/Urinary Tract: 2.8 cm right adrenal hemorrhage extending into the posterior retroperitoneum with a small amount of extravasation evident on delayed scan. Left adrenal unremarkable. Kidneys unremarkable. No hydronephrosis. Urinary bladder nondistended. Stomach/Bowel: Physiologic distention of stomach with  fluid and gas. The small bowel is nondilated. Normal appendix. Colon decompressed, unremarkable. Vascular/Lymphatic: No significant vascular findings are present. No enlarged abdominal or pelvic lymph nodes. Reproductive: Prostate is unremarkable. Other: No ascites.  No free air. Musculoskeletal: Lumbar spine intact. Bony pelvis intact. Bilateral hips unremarkable. Comminuted sub trochanteric fracture of the left femoral shaft, incompletely visualized. IMPRESSION: 1. 4.7 cm linear laceration hepatic segment 7 without extracapsular hematoma or active extravasation. 2. 2.8 cm right adrenal hemorrhage with some extravasation evident on delayed scan. 3. 4.5 cm splenic laceration without extracapsular hematoma or active extravasation. 4. Minimally displaced left eleventh rib fracture; no pneumothorax. 5. Comminuted proximal left femoral shaft subtrochanteric fracture, incompletely visualized. 6. Endotracheal tube in good position. 7. Advanced pulmonary bullous emphysema. Critical Value/emergent results were reviewed in person at the time of interpretation on 11/29/2017 at 12:33 pm with Dr. Grandville Silos , who verbally acknowledged these results. Electronically Signed   By: Lucrezia Europe M.D.   On: 11/29/2017 12:34   Ct Cervical Spine Wo Contrast  Result Date: 11/29/2017 CLINICAL DATA:  Trauma with facial fracture suspected EXAM: CT HEAD WITHOUT CONTRAST CT MAXILLOFACIAL WITHOUT CONTRAST CT CERVICAL SPINE WITHOUT CONTRAST TECHNIQUE: Multidetector CT imaging of the head, cervical spine, and maxillofacial structures were  performed using the standard protocol without intravenous contrast. Multiplanar CT image reconstructions of the cervical spine and maxillofacial structures were also generated. COMPARISON:  None. FINDINGS: CT HEAD FINDINGS Brain: No evidence of acute infarction, hemorrhage, hydrocephalus, extra-axial collection or mass lesion/mass effect. Vascular: Negative Skull: Facial fractures and skull base fractures described below. CT MAXILLOFACIAL FINDINGS Osseous: Nondisplaced right anterior mandible fracture extending between the canine and first premolar roots. No additional mandibular fracture is seen. No mandibular dislocation. Tripod fracture on the right with anterior and posterior walls right maxillary sinus, right orbital floor, lateral right orbit wall. The fracture complexes incomplete at the level of the zygomatic arch. There is mild depression of the right zygoma compared to the left. Right temporal bone fracture with hemotympanum. Fracture appears to spare the otic capsule. There is disruption of the malleoincudal joint. Central skull base fracture traversing the pterygoid bodies and sphenoid sinuses. Posterior nasal septum shows segmental fracturing. Best seen on coronal reformats there is fracture offset along the right foramen rotundum. No carotid canal involvement. Right orbital roof fracture.  Small volume pneumocephalus. Orbits: Soft tissue gas from sinus fracturing on the right where there is also mild extraconal stranding. No hematoma or worrisome proptosis. Sinuses: Patchy hemosinus. Soft tissues: Facial contusion.  No opaque foreign body. CT CERVICAL SPINE FINDINGS Alignment: No traumatic malalignment Skull base and vertebrae: Negative for fracture Soft tissues and spinal canal: No prevertebral fluid or swelling. No visible canal hematoma. Disc levels:  Negative Upper chest: Reported separately. IMPRESSION: Maxillofacial CT: 1. Nondisplaced right anterior mandible fracture. 2. Tripod fracture on the  right that is incomplete at the zygomatic arch. 3. Central skull base fracturing involving the sphenoid sinuses. This fracture is displaced at the right foramen rotundum. 4. Right temporal bone fracture with malleo-incudal subluxation. Head CT: 1. Negative for intracranial hemorrhage or visible contusion. 2. Small volume pneumocephalus. Cervical spine CT: No evidence of injury. Electronically Signed   By: Monte Fantasia M.D.   On: 11/29/2017 12:39   Ct Abdomen Pelvis W Contrast  Result Date: 11/29/2017 CLINICAL DATA:  Hit by tree, blunt trauma EXAM: CT CHEST, ABDOMEN, AND PELVIS WITH CONTRAST TECHNIQUE: Multidetector CT imaging of the chest, abdomen and pelvis was performed following the standard protocol during  bolus administration of intravenous contrast. CONTRAST:  135m OMNIPAQUE IOHEXOL 300 MG/ML  SOLN COMPARISON:  None. FINDINGS: CT CHEST FINDINGS Cardiovascular: Heart size normal. No pericardial effusion. No thoracic aortic aneurysm. Mediastinum/Nodes: No mediastinal hematoma. No hilar or mediastinal adenopathy. Endotracheal tube projects in expected location. Lungs/Pleura: No pneumothorax. No pleural effusion. Advanced bullous emphysema most marked in the apices. Consolidation/atelectasis posteriorly in both lungs. Musculoskeletal: Minimally displaced fracture, posterior aspect left eleventh rib. CT ABDOMEN PELVIS FINDINGS Hepatobiliary: 4.7 cm linear laceration in hepatic segment 7, extending to the capsule medially just posterior to the IVC. No definite active extravasation. Gallbladder unremarkable. Pancreas: Unremarkable. No pancreatic ductal dilatation or surrounding inflammatory changes. Spleen: 4.5 cm laceration in the cephalad aspect of the spine bleeding extending towards the posterior capsule without definite capsular injury. There is no perisplenic hematoma. No active extravasation evident. Adrenals/Urinary Tract: 2.8 cm right adrenal hemorrhage extending into the posterior retroperitoneum  with a small amount of extravasation evident on delayed scan. Left adrenal unremarkable. Kidneys unremarkable. No hydronephrosis. Urinary bladder nondistended. Stomach/Bowel: Physiologic distention of stomach with fluid and gas. The small bowel is nondilated. Normal appendix. Colon decompressed, unremarkable. Vascular/Lymphatic: No significant vascular findings are present. No enlarged abdominal or pelvic lymph nodes. Reproductive: Prostate is unremarkable. Other: No ascites.  No free air. Musculoskeletal: Lumbar spine intact. Bony pelvis intact. Bilateral hips unremarkable. Comminuted sub trochanteric fracture of the left femoral shaft, incompletely visualized. IMPRESSION: 1. 4.7 cm linear laceration hepatic segment 7 without extracapsular hematoma or active extravasation. 2. 2.8 cm right adrenal hemorrhage with some extravasation evident on delayed scan. 3. 4.5 cm splenic laceration without extracapsular hematoma or active extravasation. 4. Minimally displaced left eleventh rib fracture; no pneumothorax. 5. Comminuted proximal left femoral shaft subtrochanteric fracture, incompletely visualized. 6. Endotracheal tube in good position. 7. Advanced pulmonary bullous emphysema. Critical Value/emergent results were reviewed in person at the time of interpretation on 11/29/2017 at 12:33 pm with Dr. TGrandville Silos, who verbally acknowledged these results. Electronically Signed   By: DLucrezia EuropeM.D.   On: 11/29/2017 12:34   Dg Pelvis Portable  Addendum Date: 11/29/2017   ADDENDUM REPORT: 11/29/2017 11:33 ADDENDUM: Study discussed by telephone with Dr. NDavonna Belling And additionally, the clinical data should state "42year old MALE". Electronically Signed   By: HGenevie AnnM.D.   On: 11/29/2017 11:33   Result Date: 11/29/2017 CLINICAL DATA:  42year old male status post blunt trauma, struck by tree. EXAM: PORTABLE PELVIS 1-2 VIEWS COMPARISON:  None. FINDINGS: Portable AP view at 1054 hours. Highly comminuted fracture  of the proximal left femur beginning at the proximal shaft just distal to the intertrochanteric segment. Displaced butterfly fragments. Proximal to the shaft the left femur appears intact. The left femoral head is normally located. Grossly intact proximal right femur. No pelvis fracture identified. Negative visible bowel gas pattern. IMPRESSION: 1. Partially visible highly comminuted left femoral shaft fracture. The proximal left femur to the intertrochanteric segment appears intact. 2. No superimposed pelvis fracture. Electronically Signed: By: HGenevie AnnM.D. On: 11/29/2017 11:17   Dg Chest Port 1 View  Addendum Date: 11/29/2017   ADDENDUM REPORT: 11/29/2017 11:32 ADDENDUM: Study discussed by telephone with Dr. NDavonna Bellingon 11/29/2017 at 1125 hours. We favor bullous emphysema in the right upper lung, he reports the patient described a history of significant right lung emphysema. Chest CT is pending at this time. The Additionally, the clinical data should state "42year old MALE". Electronically Signed   By: HGenevie AnnM.D.   On: 11/29/2017 11:32  Result Date: 11/29/2017 CLINICAL DATA:  42 year old male status post blunt trauma, struck by tree. Chest pain. EXAM: PORTABLE CHEST 1 VIEW COMPARISON:  None. FINDINGS: Portable AP supine views are provided at both 1054 and 1111 hours. Suspicion of paraseptal emphysema in both lungs. There is a lack of lung markings in the right apex which could reflect a giant bulla or pneumothorax. There is associated vague right infrahilar opacity suggesting atelectasis. No definite pneumothorax on the left. No pleural effusion identified. Mediastinal contours remain within normal limits. On the 2nd film at 1111 hours and endotracheal tube has been placed with tip at the level of clavicles. No acute osseous abnormality identified. IMPRESSION: 1. Intubated on the 2nd film at 1111 hours with endotracheal tube tip at the level the clavicles. 2. Emphysema suspected with large  Bulla versus Pneumothorax in the right apex. Mild right lung atelectasis suspected. 3. No other acute traumatic injury identified. Electronically Signed: By: Genevie Ann M.D. On: 11/29/2017 11:20   Dg Ankle Left Port  Result Date: 11/29/2017 CLINICAL DATA:  Left ankle pain and swelling EXAM: PORTABLE LEFT ANKLE - 2 VIEW COMPARISON:  None. FINDINGS: There is no evidence of fracture, dislocation, or joint effusion. There is no evidence of arthropathy or other focal bone abnormality. Soft tissues are unremarkable. IMPRESSION: Negative. Electronically Signed   By: Kathreen Devoid   On: 11/29/2017 11:38   Dg C-arm 1-60 Min  Result Date: 11/29/2017 CLINICAL DATA:  ORIF. EXAM: LEFT FEMUR 2 VIEWS; DG C-ARM 61-120 MIN COMPARISON:  Same day FINDINGS: Multiple C-arm images show an open comminuted fracture of the mid shaft of the fibula on the right. Multiple images on the left show a mildly comminuted an apparently nondisplaced fracture of the mid shaft of the fibula. Subsequent images show gamma nail fixation of a comminuted femur fracture on the left with distal locking screws. IMPRESSION: Multiple lower extremity C-arm images as above. Electronically Signed   By: Nelson Chimes M.D.   On: 11/29/2017 16:45   Dg C-arm 1-60 Min  Result Date: 11/29/2017 CLINICAL DATA:  Irrigation of right lower extremity wound for open tibial fracture. EXAM: RIGHT TIBIA AND FIBULA - 2 VIEW; DG C-ARM 61-120 MIN; LEFT TIBIA AND FIBULA - 2 VIEW FLUOROSCOPY TIME:  21 seconds. COMPARISON:  Radiographs of same day. FINDINGS: Four intraoperative fluoroscopic images were obtained of the right lower leg. These demonstrate severely comminuted and displaced fracture involving the midshaft of the right tibia with overlying soft tissue laceration or wound. Three intraoperative fluoroscopic images of the left tibia and fibula demonstrate mildly displaced and comminuted left fibular shaft fracture. IMPRESSION: Fluoroscopic guidance and evaluation was  performed of bilateral fibular fractures. Electronically Signed   By: Marijo Conception, M.D.   On: 11/29/2017 16:21   Dg Femur Portable 1 View Left  Result Date: 11/29/2017 CLINICAL DATA:  Hit by falling tree. EXAM: LEFT FEMUR PORTABLE 1 VIEW COMPARISON:  None. FINDINGS: There is a comminuted displaced fracture through the proximal to midshaft of the left femur. The distal fragment is displaced greater than 1 shaft with laterally. No visible hip dislocation or subluxation. IMPRESSION: Comminuted, displaced proximal to mid left femoral fracture. Electronically Signed   By: Rolm Baptise M.D.   On: 11/29/2017 11:40   Dg Femur Min 2 Views Left  Result Date: 11/29/2017 CLINICAL DATA:  ORIF. EXAM: LEFT FEMUR 2 VIEWS; DG C-ARM 61-120 MIN COMPARISON:  Same day FINDINGS: Multiple C-arm images show an open comminuted fracture of the mid  shaft of the fibula on the right. Multiple images on the left show a mildly comminuted an apparently nondisplaced fracture of the mid shaft of the fibula. Subsequent images show gamma nail fixation of a comminuted femur fracture on the left with distal locking screws. IMPRESSION: Multiple lower extremity C-arm images as above. Electronically Signed   By: Nelson Chimes M.D.   On: 11/29/2017 16:45   Ct Maxillofacial Wo Contrast  Result Date: 11/29/2017 CLINICAL DATA:  Trauma with facial fracture suspected EXAM: CT HEAD WITHOUT CONTRAST CT MAXILLOFACIAL WITHOUT CONTRAST CT CERVICAL SPINE WITHOUT CONTRAST TECHNIQUE: Multidetector CT imaging of the head, cervical spine, and maxillofacial structures were performed using the standard protocol without intravenous contrast. Multiplanar CT image reconstructions of the cervical spine and maxillofacial structures were also generated. COMPARISON:  None. FINDINGS: CT HEAD FINDINGS Brain: No evidence of acute infarction, hemorrhage, hydrocephalus, extra-axial collection or mass lesion/mass effect. Vascular: Negative Skull: Facial fractures and  skull base fractures described below. CT MAXILLOFACIAL FINDINGS Osseous: Nondisplaced right anterior mandible fracture extending between the canine and first premolar roots. No additional mandibular fracture is seen. No mandibular dislocation. Tripod fracture on the right with anterior and posterior walls right maxillary sinus, right orbital floor, lateral right orbit wall. The fracture complexes incomplete at the level of the zygomatic arch. There is mild depression of the right zygoma compared to the left. Right temporal bone fracture with hemotympanum. Fracture appears to spare the otic capsule. There is disruption of the malleoincudal joint. Central skull base fracture traversing the pterygoid bodies and sphenoid sinuses. Posterior nasal septum shows segmental fracturing. Best seen on coronal reformats there is fracture offset along the right foramen rotundum. No carotid canal involvement. Right orbital roof fracture.  Small volume pneumocephalus. Orbits: Soft tissue gas from sinus fracturing on the right where there is also mild extraconal stranding. No hematoma or worrisome proptosis. Sinuses: Patchy hemosinus. Soft tissues: Facial contusion.  No opaque foreign body. CT CERVICAL SPINE FINDINGS Alignment: No traumatic malalignment Skull base and vertebrae: Negative for fracture Soft tissues and spinal canal: No prevertebral fluid or swelling. No visible canal hematoma. Disc levels:  Negative Upper chest: Reported separately. IMPRESSION: Maxillofacial CT: 1. Nondisplaced right anterior mandible fracture. 2. Tripod fracture on the right that is incomplete at the zygomatic arch. 3. Central skull base fracturing involving the sphenoid sinuses. This fracture is displaced at the right foramen rotundum. 4. Right temporal bone fracture with malleo-incudal subluxation. Head CT: 1. Negative for intracranial hemorrhage or visible contusion. 2. Small volume pneumocephalus. Cervical spine CT: No evidence of injury.  Electronically Signed   By: Monte Fantasia M.D.   On: 11/29/2017 12:39    ROS Blood pressure 124/75, pulse 61, temperature (!) 97.5 F (36.4 C), temperature source Axillary, resp. rate (!) 1, height _0  (1.88 m), weight 99.8 kg, SpO2 100 %. Physical Exam  Constitutional: He appears well-developed.  HENT:  ETT in place. Patient is completely sedated. The right ear has bandage and the canal is swollen shut. The pinna has a laceration that is closed. The nose looks open and no injury. The facial nerve cannot be assessed There is ecchymosis under the right eye  Neck:  c- collar in place    Assessment/Plan: Mandible fracture/right tripod fracture/orbital fracture//sphenoid pterygoid plate fracture/right temporal bone fracture-his mandible fracture is anterior at the canine and will need open reduction internal fixation. He will need MMF. He is due to have more operations on the leg so it may be necessary to proceed  with a tracheotomy. He also has a right tripod fracture that is displaced and the family feels that probably should be repaired especially since it will be difficult to assess his functional issues given the mandible fracture. His right ear has ossicular chain disruption. It's unclear if he has any facial nerve weakness at this time but reports are that he did not have weakness at the time of presentation. I discussed the fracture of the tripod and mandible with the family. We discussed the risks, benefits, and options. We will combine this with orthopedics on Thursday. I will discuss with the family again regarding the tracheotomy. We also discussed CSF leaks which is possible from the sphenoid sinus as well as the ear. So far it does not appear that there is any clear fluid. This will need to be continued to watch.  Melissa Montane 11/29/2017, 5:23 PM

## 2017-11-30 ENCOUNTER — Inpatient Hospital Stay (HOSPITAL_COMMUNITY): Payer: Worker's Compensation

## 2017-11-30 ENCOUNTER — Encounter (HOSPITAL_COMMUNITY): Payer: Self-pay | Admitting: Orthopedic Surgery

## 2017-11-30 LAB — LACTIC ACID, PLASMA: Lactic Acid, Venous: 3 mmol/L (ref 0.5–1.9)

## 2017-11-30 LAB — URINALYSIS, ROUTINE W REFLEX MICROSCOPIC
BILIRUBIN URINE: NEGATIVE
Glucose, UA: NEGATIVE mg/dL
HGB URINE DIPSTICK: NEGATIVE
Ketones, ur: NEGATIVE mg/dL
Leukocytes, UA: NEGATIVE
NITRITE: NEGATIVE
PROTEIN: NEGATIVE mg/dL
SPECIFIC GRAVITY, URINE: 1.024 (ref 1.005–1.030)
pH: 5 (ref 5.0–8.0)

## 2017-11-30 LAB — CBC
HEMATOCRIT: 33.2 % — AB (ref 39.0–52.0)
HEMOGLOBIN: 11 g/dL — AB (ref 13.0–17.0)
MCH: 29.8 pg (ref 26.0–34.0)
MCHC: 33.1 g/dL (ref 30.0–36.0)
MCV: 90 fL (ref 80.0–100.0)
Platelets: 182 10*3/uL (ref 150–400)
RBC: 3.69 MIL/uL — ABNORMAL LOW (ref 4.22–5.81)
RDW: 14.2 % (ref 11.5–15.5)
WBC: 9.7 10*3/uL (ref 4.0–10.5)
nRBC: 0 % (ref 0.0–0.2)

## 2017-11-30 LAB — BASIC METABOLIC PANEL
Anion gap: 9 (ref 5–15)
BUN: 9 mg/dL (ref 6–20)
CHLORIDE: 102 mmol/L (ref 98–111)
CO2: 22 mmol/L (ref 22–32)
CREATININE: 0.92 mg/dL (ref 0.61–1.24)
Calcium: 7.2 mg/dL — ABNORMAL LOW (ref 8.9–10.3)
GFR calc Af Amer: >60 mL/min (ref >60)
GFR calc non Af Amer: >60 mL/min (ref >60)
GLUCOSE: 136 mg/dL — AB (ref 70–99)
Potassium: 4.2 mmol/L (ref 3.5–5.1)
Sodium: 133 mmol/L — ABNORMAL LOW (ref 135–145)

## 2017-11-30 LAB — POCT I-STAT 3, ART BLOOD GAS (G3+)
Acid-base deficit: 5 mmol/L — ABNORMAL HIGH (ref 0.0–2.0)
BICARBONATE: 20.9 mmol/L (ref 20.0–28.0)
O2 Saturation: 94 %
PCO2 ART: 39.7 mmHg (ref 32.0–48.0)
Patient temperature: 98.4
TCO2: 22 mmol/L (ref 22–32)
pH, Arterial: 7.329 — ABNORMAL LOW (ref 7.350–7.450)
pO2, Arterial: 74 mmHg — ABNORMAL LOW (ref 83.0–108.0)

## 2017-11-30 LAB — RAPID URINE DRUG SCREEN, HOSP PERFORMED
Amphetamines: NOT DETECTED
Barbiturates: NOT DETECTED
Benzodiazepines: POSITIVE — AB
COCAINE: NOT DETECTED
OPIATES: NOT DETECTED
Tetrahydrocannabinol: NOT DETECTED

## 2017-11-30 LAB — HIV ANTIBODY (ROUTINE TESTING W REFLEX): HIV SCREEN 4TH GENERATION: NONREACTIVE

## 2017-11-30 LAB — CK: Total CK: 3659 U/L — ABNORMAL HIGH (ref 49–397)

## 2017-11-30 LAB — BLOOD PRODUCT ORDER (VERBAL) VERIFICATION

## 2017-11-30 NOTE — Anesthesia Postprocedure Evaluation (Signed)
Anesthesia Post Note  Patient: Willie Blevins  Procedure(s) Performed: INTRAMEDULLARY (IM) NAIL INTERTROCHANTRIC (Left Leg Upper) IRRIGATION AND DEBRIDEMENT EXTREMITY (Right Leg Lower) APPLICATION OF WOUND VAC (Right Leg Lower) ear LACERATION REPAIR (Left Ear)     Patient location during evaluation: PACU Anesthesia Type: General Level of consciousness: awake and alert Pain management: pain level controlled Vital Signs Assessment: post-procedure vital signs reviewed and stable Respiratory status: spontaneous breathing, nonlabored ventilation, respiratory function stable and patient connected to nasal cannula oxygen Cardiovascular status: blood pressure returned to baseline and stable Postop Assessment: no apparent nausea or vomiting Anesthetic complications: no    Last Vitals:  Vitals:   11/30/17 0500 11/30/17 0600  BP: (!) 97/54 (!) 99/56  Pulse: 90 89  Resp: 16 16  Temp:    SpO2: 97% 98%    Last Pain:  Vitals:   11/30/17 0400  TempSrc: Axillary  PainSc:                  [name]     

## 2017-11-30 NOTE — Progress Notes (Signed)
[facility] ([facility])  1 Day Post-Op Procedure(s) (LRB): INTRAMEDULLARY (IM) NAIL INTERTROCHANTRIC (Left) IRRIGATION AND DEBRIDEMENT EXTREMITY (Right) ear LACERATION REPAIR (Left) APPLICATION OF WOUND VAC (Right)  Subjective: Patient intubated but able to follow commands intermittently. Wife and daughter at bedside.  Objective: Current Vitals Blood pressure (!) 99/56, pulse 89, temperature 99.4 F (37.4 C), temperature source Axillary, resp. rate 16, height 6\' 2"  (1.88 m), weight 99.8 kg, SpO2 98 %. Vital signs in last 24 hours: Temp:  [96.6 F (35.9 C)-99.4 F (37.4 C)] 99.4 F (37.4 C) (10/16 0400) Pulse Rate:  [60-115] 89 (10/16 0600) Resp:  [0-24] 16 (10/16 0600) BP: (97-165)/(51-107) 99/56 (10/16 0600) SpO2:  [97 %-100 %] 98 % (10/16 0600) Arterial Line BP: (104-203)/(50-87) 104/52 (10/16 0600) FiO2 (%):  [40 %-100 %] 40 % (10/16 0400) Weight:  [99.8 kg] 99.8 kg (10/15 1112)  Intake/Output from previous day: 10/15 0701 - 10/16 0700 In: 8940.1 [I.V.:7990; IV Piggyback:950.1] Out: 3125 [Urine:1975; Emesis/NG output:500; Drains:250; Blood:400]  LABS Recent Labs    11/29/17 1121 11/29/17 1219 11/29/17 1339 11/29/17 1442 11/30/17 0535  HGB 14.9 14.6 12.2* 9.9* 11.0*   Recent Labs    11/29/17 1121  11/29/17 1442 11/30/17 0535  WBC 19.6*  --   --  9.7  RBC 5.00  --   --  3.69*  HCT 46.0   < > 29.0* 33.2*  PLT 230  --   --  182   < > = values in this interval not displayed.   Recent Labs    11/29/17 1121  11/29/17 1442 11/30/17 0535  NA 138   < > 138 133*  K 3.8   < > 4.2 4.2  CL 109  --   --  102  CO2 19*  --   --  22  BUN 16  --   --  9  CREATININE 1.16  --   --  0.92  GLUCOSE 164*  --   --  136*  CALCIUM 8.0*  --   --  7.2*   < > = values in this interval not displayed.   Recent Labs    11/29/17 1121  INR 1.08   CK has decreased from 4500 to 3659. Base ex improved  Physical Exam R&LLE  Dressing intact, clean, dry  Edema/  swelling controlled  Sens: DPN, SPN, TN intact  Motor: EHL, FHL, and lessor toe ext and flex all intact grossly  Brisk cap refill, warm to touch  Assessment/Plan: 1 Day Post-Op Procedure(s) (LRB): INTRAMEDULLARY (IM) NAIL INTERTROCHANTRIC (Left) IRRIGATION AND DEBRIDEMENT EXTREMITY (Right) ear LACERATION REPAIR (Left) APPLICATION OF WOUND VAC (Right) 1. PT/OT PWB on the LLE but could probably advance to WBAT bilaterally if too much difficulty; CAM boots could be used for assistance, as well 2. DVT proph Lovenox 3. OR tomorrow for soft tissue second look and probable closure of right leg; NO FURTHER PLANNED ORTHOPAEDIC SURGERIES THEREAFTER  Myrene Galas, MD [facility] [phone] (p)

## 2017-11-30 NOTE — Progress Notes (Signed)
1 Day Post-Op   Subjective/Chief Complaint: Intubated and awaking up some   Objective: Vital signs in last 24 hours: Temp:  [97.5 F (36.4 C)-100.4 F (38 C)] 100.3 F (37.9 C) (10/16 1200) Pulse Rate:  [60-99] 82 (10/16 1607) Resp:  [0-24] 16 (10/16 1607) BP: (86-165)/(51-107) 98/56 (10/16 1607) SpO2:  [94 %-100 %] 94 % (10/16 1607) Arterial Line BP: (104-203)/(45-87) 125/50 (10/16 0930) FiO2 (%):  [40 %-50 %] 40 % (10/16 1607) Last BM Date: (pta)  Intake/Output from previous day: 10/15 0701 - 10/16 0700 In: 8940.1 [I.V.:7990; IV Piggyback:950.1] Out: 3125 [Urine:1975; Emesis/NG output:500; Drains:250; Blood:400] Intake/Output this shift: Total I/O In: -  Out: 500 [Urine:500]  he aroused while in the room and his right brow does not move. his eye is closed. He still has a c-collar in place. Still intubated.  Lab Results:  Recent Labs    11/29/17 1121  11/29/17 1442 11/30/17 0535  WBC 19.6*  --   --  9.7  HGB 14.9   < > 9.9* 11.0*  HCT 46.0   < > 29.0* 33.2*  PLT 230  --   --  182   < > = values in this interval not displayed.   BMET Recent Labs    11/29/17 1121  11/29/17 1442 11/30/17 0535  NA 138   < > 138 133*  K 3.8   < > 4.2 4.2  CL 109  --   --  102  CO2 19*  --   --  22  GLUCOSE 164*  --   --  136*  BUN 16  --   --  9  CREATININE 1.16  --   --  0.92  CALCIUM 8.0*  --   --  7.2*   < > = values in this interval not displayed.   PT/INR Recent Labs    11/29/17 1121  LABPROT 13.9  INR 1.08   ABG Recent Labs    11/29/17 1442 11/30/17 0136  PHART 7.339* 7.329*  HCO3 21.6 20.9    Studies/Results: Dg Tibia/fibula Left  Result Date: 11/29/2017 CLINICAL DATA:  Irrigation of right lower extremity wound for open tibial fracture. EXAM: RIGHT TIBIA AND FIBULA - 2 VIEW; DG C-ARM 61-120 MIN; LEFT TIBIA AND FIBULA - 2 VIEW FLUOROSCOPY TIME:  21 seconds. COMPARISON:  Radiographs of same day. FINDINGS: Four intraoperative fluoroscopic images were  obtained of the right lower leg. These demonstrate severely comminuted and displaced fracture involving the midshaft of the right tibia with overlying soft tissue laceration or wound. Three intraoperative fluoroscopic images of the left tibia and fibula demonstrate mildly displaced and comminuted left fibular shaft fracture. IMPRESSION: Fluoroscopic guidance and evaluation was performed of bilateral fibular fractures. Electronically Signed   By: Lupita Raider, M.D.   On: 11/29/2017 16:21   Dg Tibia/fibula Right  Result Date: 11/29/2017 CLINICAL DATA:  Irrigation of right lower extremity wound for open tibial fracture. EXAM: RIGHT TIBIA AND FIBULA - 2 VIEW; DG C-ARM 61-120 MIN; LEFT TIBIA AND FIBULA - 2 VIEW FLUOROSCOPY TIME:  21 seconds. COMPARISON:  Radiographs of same day. FINDINGS: Four intraoperative fluoroscopic images were obtained of the right lower leg. These demonstrate severely comminuted and displaced fracture involving the midshaft of the right tibia with overlying soft tissue laceration or wound. Three intraoperative fluoroscopic images of the left tibia and fibula demonstrate mildly displaced and comminuted left fibular shaft fracture. IMPRESSION: Fluoroscopic guidance and evaluation was performed of bilateral fibular fractures. Electronically Signed  By: Lupita Raider, M.D.   On: 11/29/2017 16:21   Dg Tibia/fibula Right  Result Date: 11/29/2017 CLINICAL DATA:  Patient struck by falling tree. Open wound of the right leg EXAM: RIGHT TIBIA AND FIBULA - 2 VIEW COMPARISON:  None. FINDINGS: Soft tissue wound along the posterolateral aspect of the right leg. Soft tissue emphysema around the wound. Comminuted fracture of the proximal-mid fibular diaphysis with 8 mm of distal displacement of the major fracture fragment. No other fracture or dislocation. IMPRESSION: 1. Open comminuted fracture of the proximal-mid fibular diaphysis. Electronically Signed   By: Elige Ko   On: 11/29/2017 11:40    Ct Head Wo Contrast  Result Date: 11/29/2017 CLINICAL DATA:  Trauma with facial fracture suspected EXAM: CT HEAD WITHOUT CONTRAST CT MAXILLOFACIAL WITHOUT CONTRAST CT CERVICAL SPINE WITHOUT CONTRAST TECHNIQUE: Multidetector CT imaging of the head, cervical spine, and maxillofacial structures were performed using the standard protocol without intravenous contrast. Multiplanar CT image reconstructions of the cervical spine and maxillofacial structures were also generated. COMPARISON:  None. FINDINGS: CT HEAD FINDINGS Brain: No evidence of acute infarction, hemorrhage, hydrocephalus, extra-axial collection or mass lesion/mass effect. Vascular: Negative Skull: Facial fractures and skull base fractures described below. CT MAXILLOFACIAL FINDINGS Osseous: Nondisplaced right anterior mandible fracture extending between the canine and first premolar roots. No additional mandibular fracture is seen. No mandibular dislocation. Tripod fracture on the right with anterior and posterior walls right maxillary sinus, right orbital floor, lateral right orbit wall. The fracture complexes incomplete at the level of the zygomatic arch. There is mild depression of the right zygoma compared to the left. Right temporal bone fracture with hemotympanum. Fracture appears to spare the otic capsule. There is disruption of the malleoincudal joint. Central skull base fracture traversing the pterygoid bodies and sphenoid sinuses. Posterior nasal septum shows segmental fracturing. Best seen on coronal reformats there is fracture offset along the right foramen rotundum. No carotid canal involvement. Right orbital roof fracture.  Small volume pneumocephalus. Orbits: Soft tissue gas from sinus fracturing on the right where there is also mild extraconal stranding. No hematoma or worrisome proptosis. Sinuses: Patchy hemosinus. Soft tissues: Facial contusion.  No opaque foreign body. CT CERVICAL SPINE FINDINGS Alignment: No traumatic malalignment  Skull base and vertebrae: Negative for fracture Soft tissues and spinal canal: No prevertebral fluid or swelling. No visible canal hematoma. Disc levels:  Negative Upper chest: Reported separately. IMPRESSION: Maxillofacial CT: 1. Nondisplaced right anterior mandible fracture. 2. Tripod fracture on the right that is incomplete at the zygomatic arch. 3. Central skull base fracturing involving the sphenoid sinuses. This fracture is displaced at the right foramen rotundum. 4. Right temporal bone fracture with malleo-incudal subluxation. Head CT: 1. Negative for intracranial hemorrhage or visible contusion. 2. Small volume pneumocephalus. Cervical spine CT: No evidence of injury. Electronically Signed   By: Marnee Spring M.D.   On: 11/29/2017 12:39   Ct Chest W Contrast  Result Date: 11/29/2017 CLINICAL DATA:  Hit by tree, blunt trauma EXAM: CT CHEST, ABDOMEN, AND PELVIS WITH CONTRAST TECHNIQUE: Multidetector CT imaging of the chest, abdomen and pelvis was performed following the standard protocol during bolus administration of intravenous contrast. CONTRAST:  OMNIPAQUE IOHEXOL 300 MG/ML  SOLN COMPARISON:  None. FINDINGS: CT CHEST FINDINGS Cardiovascular: Heart size normal. No pericardial effusion. No thoracic aortic aneurysm. Mediastinum/Nodes: No mediastinal hematoma. No hilar or mediastinal adenopathy. Endotracheal tube projects in expected location. Lungs/Pleura: No pneumothorax. No pleural effusion. Advanced bullous emphysema most marked in the apices.  Consolidation/atelectasis posteriorly in both lungs. Musculoskeletal: Minimally displaced fracture, posterior aspect left eleventh rib. CT ABDOMEN PELVIS FINDINGS Hepatobiliary: 4.7 cm linear laceration in hepatic segment 7, extending to the capsule medially just posterior to the IVC. No definite active extravasation. Gallbladder unremarkable. Pancreas: Unremarkable. No pancreatic ductal dilatation or surrounding inflammatory changes. Spleen: 4.5 cm  laceration in the cephalad aspect of the spine bleeding extending towards the posterior capsule without definite capsular injury. There is no perisplenic hematoma. No active extravasation evident. Adrenals/Urinary Tract: 2.8 cm right adrenal hemorrhage extending into the posterior retroperitoneum with a small amount of extravasation evident on delayed scan. Left adrenal unremarkable. Kidneys unremarkable. No hydronephrosis. Urinary bladder nondistended. Stomach/Bowel: Physiologic distention of stomach with fluid and gas. The small bowel is nondilated. Normal appendix. Colon decompressed, unremarkable. Vascular/Lymphatic: No significant vascular findings are present. No enlarged abdominal or pelvic lymph nodes. Reproductive: Prostate is unremarkable. Other: No ascites.  No free air. Musculoskeletal: Lumbar spine intact. Bony pelvis intact. Bilateral hips unremarkable. Comminuted sub trochanteric fracture of the left femoral shaft, incompletely visualized. IMPRESSION: 1. 4.7 cm linear laceration hepatic segment 7 without extracapsular hematoma or active extravasation. 2. 2.8 cm right adrenal hemorrhage with some extravasation evident on delayed scan. 3. 4.5 cm splenic laceration without extracapsular hematoma or active extravasation. 4. Minimally displaced left eleventh rib fracture; no pneumothorax. 5. Comminuted proximal left femoral shaft subtrochanteric fracture, incompletely visualized. 6. Endotracheal tube in good position. 7. Advanced pulmonary bullous emphysema. Critical Value/emergent results were reviewed in person at the time of interpretation on 11/29/2017 at 12:33 pm with Dr. Janee Morn , who verbally acknowledged these results. Electronically Signed   By: Corlis Leak M.D.   On: 11/29/2017 12:34   Ct Cervical Spine Wo Contrast  Result Date: 11/29/2017 CLINICAL DATA:  Trauma with facial fracture suspected EXAM: CT HEAD WITHOUT CONTRAST CT MAXILLOFACIAL WITHOUT CONTRAST CT CERVICAL SPINE WITHOUT CONTRAST  TECHNIQUE: Multidetector CT imaging of the head, cervical spine, and maxillofacial structures were performed using the standard protocol without intravenous contrast. Multiplanar CT image reconstructions of the cervical spine and maxillofacial structures were also generated. COMPARISON:  None. FINDINGS: CT HEAD FINDINGS Brain: No evidence of acute infarction, hemorrhage, hydrocephalus, extra-axial collection or mass lesion/mass effect. Vascular: Negative Skull: Facial fractures and skull base fractures described below. CT MAXILLOFACIAL FINDINGS Osseous: Nondisplaced right anterior mandible fracture extending between the canine and first premolar roots. No additional mandibular fracture is seen. No mandibular dislocation. Tripod fracture on the right with anterior and posterior walls right maxillary sinus, right orbital floor, lateral right orbit wall. The fracture complexes incomplete at the level of the zygomatic arch. There is mild depression of the right zygoma compared to the left. Right temporal bone fracture with hemotympanum. Fracture appears to spare the otic capsule. There is disruption of the malleoincudal joint. Central skull base fracture traversing the pterygoid bodies and sphenoid sinuses. Posterior nasal septum shows segmental fracturing. Best seen on coronal reformats there is fracture offset along the right foramen rotundum. No carotid canal involvement. Right orbital roof fracture.  Small volume pneumocephalus. Orbits: Soft tissue gas from sinus fracturing on the right where there is also mild extraconal stranding. No hematoma or worrisome proptosis. Sinuses: Patchy hemosinus. Soft tissues: Facial contusion.  No opaque foreign body. CT CERVICAL SPINE FINDINGS Alignment: No traumatic malalignment Skull base and vertebrae: Negative for fracture Soft tissues and spinal canal: No prevertebral fluid or swelling. No visible canal hematoma. Disc levels:  Negative Upper chest: Reported separately.  IMPRESSION: Maxillofacial CT: 1. Nondisplaced  right anterior mandible fracture. 2. Tripod fracture on the right that is incomplete at the zygomatic arch. 3. Central skull base fracturing involving the sphenoid sinuses. This fracture is displaced at the right foramen rotundum. 4. Right temporal bone fracture with malleo-incudal subluxation. Head CT: 1. Negative for intracranial hemorrhage or visible contusion. 2. Small volume pneumocephalus. Cervical spine CT: No evidence of injury. Electronically Signed   By: Marnee Spring M.D.   On: 11/29/2017 12:39   Ct Abdomen Pelvis W Contrast  Result Date: 11/29/2017 CLINICAL DATA:  Hit by tree, blunt trauma EXAM: CT CHEST, ABDOMEN, AND PELVIS WITH CONTRAST TECHNIQUE: Multidetector CT imaging of the chest, abdomen and pelvis was performed following the standard protocol during bolus administration of intravenous contrast. CONTRAST:  OMNIPAQUE IOHEXOL 300 MG/ML  SOLN COMPARISON:  None. FINDINGS: CT CHEST FINDINGS Cardiovascular: Heart size normal. No pericardial effusion. No thoracic aortic aneurysm. Mediastinum/Nodes: No mediastinal hematoma. No hilar or mediastinal adenopathy. Endotracheal tube projects in expected location. Lungs/Pleura: No pneumothorax. No pleural effusion. Advanced bullous emphysema most marked in the apices. Consolidation/atelectasis posteriorly in both lungs. Musculoskeletal: Minimally displaced fracture, posterior aspect left eleventh rib. CT ABDOMEN PELVIS FINDINGS Hepatobiliary: 4.7 cm linear laceration in hepatic segment 7, extending to the capsule medially just posterior to the IVC. No definite active extravasation. Gallbladder unremarkable. Pancreas: Unremarkable. No pancreatic ductal dilatation or surrounding inflammatory changes. Spleen: 4.5 cm laceration in the cephalad aspect of the spine bleeding extending towards the posterior capsule without definite capsular injury. There is no perisplenic hematoma. No active extravasation  evident. Adrenals/Urinary Tract: 2.8 cm right adrenal hemorrhage extending into the posterior retroperitoneum with a small amount of extravasation evident on delayed scan. Left adrenal unremarkable. Kidneys unremarkable. No hydronephrosis. Urinary bladder nondistended. Stomach/Bowel: Physiologic distention of stomach with fluid and gas. The small bowel is nondilated. Normal appendix. Colon decompressed, unremarkable. Vascular/Lymphatic: No significant vascular findings are present. No enlarged abdominal or pelvic lymph nodes. Reproductive: Prostate is unremarkable. Other: No ascites.  No free air. Musculoskeletal: Lumbar spine intact. Bony pelvis intact. Bilateral hips unremarkable. Comminuted sub trochanteric fracture of the left femoral shaft, incompletely visualized. IMPRESSION: 1. 4.7 cm linear laceration hepatic segment 7 without extracapsular hematoma or active extravasation. 2. 2.8 cm right adrenal hemorrhage with some extravasation evident on delayed scan. 3. 4.5 cm splenic laceration without extracapsular hematoma or active extravasation. 4. Minimally displaced left eleventh rib fracture; no pneumothorax. 5. Comminuted proximal left femoral shaft subtrochanteric fracture, incompletely visualized. 6. Endotracheal tube in good position. 7. Advanced pulmonary bullous emphysema. Critical Value/emergent results were reviewed in person at the time of interpretation on 11/29/2017 at 12:33 pm with Dr. Janee Morn , who verbally acknowledged these results. Electronically Signed   By: Corlis Leak M.D.   On: 11/29/2017 12:34   Dg Pelvis Portable  Addendum Date: 11/29/2017   ADDENDUM REPORT: 11/29/2017 11:33 ADDENDUM: Study discussed by telephone with Dr. Benjiman Core. And additionally, the clinical data should state "42 year old MALE". Electronically Signed   By: Odessa Fleming M.D.   On: 11/29/2017 11:33   Result Date: 11/29/2017 CLINICAL DATA:  42 year old male status post blunt trauma, struck by tree. EXAM:  PORTABLE PELVIS 1-2 VIEWS COMPARISON:  None. FINDINGS: Portable AP view at 1054 hours. Highly comminuted fracture of the proximal left femur beginning at the proximal shaft just distal to the intertrochanteric segment. Displaced butterfly fragments. Proximal to the shaft the left femur appears intact. The left femoral head is normally located. Grossly intact proximal right femur. No pelvis fracture identified.  Negative visible bowel gas pattern. IMPRESSION: 1. Partially visible highly comminuted left femoral shaft fracture. The proximal left femur to the intertrochanteric segment appears intact. 2. No superimposed pelvis fracture. Electronically Signed: By: Odessa Fleming M.D. On: 11/29/2017 11:17   Dg Chest Port 1 View  Result Date: 11/30/2017 CLINICAL DATA:  Left rib fracture. EXAM: PORTABLE CHEST 1 VIEW COMPARISON:  Radiographs and CT scan of November 29, 2017. FINDINGS: The heart size and mediastinal contours are within normal limits. Endotracheal tube is noted with distal tip 7 cm above the carina. Nasogastric tube is seen in expected position of esophagus with side hole above gastroesophageal junction. Severe emphysematous disease is noted in both upper lobes, right greater than left. Interval placement of right internal jugular catheter with distal tip in expected position of the SVC. Mild bibasilar subsegmental atelectasis is noted with probable small pleural effusions. The visualized skeletal structures are unremarkable. IMPRESSION: Endotracheal tube in grossly good position. Interval placement of nasogastric tube with side hole above gastroesophageal junction. Right internal jugular catheter in grossly good position. Interval development of bibasilar subsegmental atelectasis or small pleural effusions. Severe emphysematous disease is noted in both upper lobes. Electronically Signed   By: Lupita Raider, M.D.   On: 11/30/2017 10:11   Dg Chest Port 1 View  Addendum Date: 11/29/2017   ADDENDUM REPORT:  11/29/2017 11:32 ADDENDUM: Study discussed by telephone with Dr. Benjiman Core on 11/29/2017 at 1125 hours. We favor bullous emphysema in the right upper lung, he reports the patient described a history of significant right lung emphysema. Chest CT is pending at this time. The Additionally, the clinical data should state "42 year old MALE". Electronically Signed   By: Odessa Fleming M.D.   On: 11/29/2017 11:32   Result Date: 11/29/2017 CLINICAL DATA:  42 year old male status post blunt trauma, struck by tree. Chest pain. EXAM: PORTABLE CHEST 1 VIEW COMPARISON:  None. FINDINGS: Portable AP supine views are provided at both 1054 and 1111 hours. Suspicion of paraseptal emphysema in both lungs. There is a lack of lung markings in the right apex which could reflect a giant bulla or pneumothorax. There is associated vague right infrahilar opacity suggesting atelectasis. No definite pneumothorax on the left. No pleural effusion identified. Mediastinal contours remain within normal limits. On the 2nd film at 1111 hours and endotracheal tube has been placed with tip at the level of clavicles. No acute osseous abnormality identified. IMPRESSION: 1. Intubated on the 2nd film at 1111 hours with endotracheal tube tip at the level the clavicles. 2. Emphysema suspected with large Bulla versus Pneumothorax in the right apex. Mild right lung atelectasis suspected. 3. No other acute traumatic injury identified. Electronically Signed: By: Odessa Fleming M.D. On: 11/29/2017 11:20   Dg Tibia/fibula Left Port  Result Date: 11/29/2017 CLINICAL DATA:  Lower leg fracture EXAM: PORTABLE LEFT TIBIA AND FIBULA - 2 VIEW COMPARISON:  11/29/2017 FINDINGS: Acute comminuted fracture involving the midshaft of the fibula with close to 1/2 bone with of medial and less than 1/4 bone with of posterior displacement of main fracture fragment. Several small displaced fragments are noted at the site of fracture. Soft tissue swelling is evident. IMPRESSION:  Acute comminuted, mildly displaced fracture involving the midshaft of the fibula Electronically Signed   By: Jasmine Pang M.D.   On: 11/29/2017 19:25   Dg Tibia/fibula Right Port  Result Date: 11/29/2017 CLINICAL DATA:  Lower leg fracture EXAM: PORTABLE RIGHT TIBIA AND FIBULA - 2 VIEW COMPARISON:  11/29/2017 FINDINGS: Acute  markedly comminuted fracture involving the midshaft of the fibula with multiple displaced bone fragments at the fracture site. Interim placement of a drain at the fracture. Decreased soft tissue gas compared to prior. IMPRESSION: Acute comminuted and displaced fracture involving the mid fibular shaft with interim placement of a drain and interval decrease in soft tissue emphysema. Electronically Signed   By: Jasmine Pang M.D.   On: 11/29/2017 19:23   Dg Ankle Left Port  Result Date: 11/29/2017 CLINICAL DATA:  Left ankle pain and swelling EXAM: PORTABLE LEFT ANKLE - 2 VIEW COMPARISON:  None. FINDINGS: There is no evidence of fracture, dislocation, or joint effusion. There is no evidence of arthropathy or other focal bone abnormality. Soft tissues are unremarkable. IMPRESSION: Negative. Electronically Signed   By: Elige Ko   On: 11/29/2017 11:38   Dg C-arm 1-60 Min  Result Date: 11/29/2017 CLINICAL DATA:  ORIF. EXAM: LEFT FEMUR 2 VIEWS; DG C-ARM 61-120 MIN COMPARISON:  Same day FINDINGS: Multiple C-arm images show an open comminuted fracture of the mid shaft of the fibula on the right. Multiple images on the left show a mildly comminuted an apparently nondisplaced fracture of the mid shaft of the fibula. Subsequent images show gamma nail fixation of a comminuted femur fracture on the left with distal locking screws. IMPRESSION: Multiple lower extremity C-arm images as above. Electronically Signed   By: Paulina Fusi M.D.   On: 11/29/2017 16:45   Dg C-arm 1-60 Min  Result Date: 11/29/2017 CLINICAL DATA:  Irrigation of right lower extremity wound for open tibial fracture.  EXAM: RIGHT TIBIA AND FIBULA - 2 VIEW; DG C-ARM 61-120 MIN; LEFT TIBIA AND FIBULA - 2 VIEW FLUOROSCOPY TIME:  21 seconds. COMPARISON:  Radiographs of same day. FINDINGS: Four intraoperative fluoroscopic images were obtained of the right lower leg. These demonstrate severely comminuted and displaced fracture involving the midshaft of the right tibia with overlying soft tissue laceration or wound. Three intraoperative fluoroscopic images of the left tibia and fibula demonstrate mildly displaced and comminuted left fibular shaft fracture. IMPRESSION: Fluoroscopic guidance and evaluation was performed of bilateral fibular fractures. Electronically Signed   By: Lupita Raider, M.D.   On: 11/29/2017 16:21   Dg Femur Portable 1 View Left  Result Date: 11/29/2017 CLINICAL DATA:  Hit by falling tree. EXAM: LEFT FEMUR PORTABLE 1 VIEW COMPARISON:  None. FINDINGS: There is a comminuted displaced fracture through the proximal to midshaft of the left femur. The distal fragment is displaced greater than 1 shaft with laterally. No visible hip dislocation or subluxation. IMPRESSION: Comminuted, displaced proximal to mid left femoral fracture. Electronically Signed   By: Charlett Nose M.D.   On: 11/29/2017 11:40   Dg Femur Min 2 Views Left  Result Date: 11/29/2017 CLINICAL DATA:  ORIF. EXAM: LEFT FEMUR 2 VIEWS; DG C-ARM 61-120 MIN COMPARISON:  Same day FINDINGS: Multiple C-arm images show an open comminuted fracture of the mid shaft of the fibula on the right. Multiple images on the left show a mildly comminuted an apparently nondisplaced fracture of the mid shaft of the fibula. Subsequent images show gamma nail fixation of a comminuted femur fracture on the left with distal locking screws. IMPRESSION: Multiple lower extremity C-arm images as above. Electronically Signed   By: Paulina Fusi M.D.   On: 11/29/2017 16:45   Dg Femur Port Min 2 Views Left  Result Date: 11/29/2017 CLINICAL DATA:  Postop EXAM: LEFT FEMUR  PORTABLE 2 VIEWS COMPARISON:  11/29/2017 FINDINGS: Interval intramedullary  rod and screw fixation of the left femur for comminuted fracture involving the proximal shaft of the femur with decreased displacement of fracture segments. Hardware appears intact. Small amount of gas in the distal soft tissues. IMPRESSION: Interval intramedullary rod and screw fixation of the comminuted proximal femoral fracture with interval decreased displacement of fracture fragments. Electronically Signed   By: Jasmine Pang M.D.   On: 11/29/2017 19:28   Ct Maxillofacial Wo Contrast  Result Date: 11/29/2017 CLINICAL DATA:  Trauma with facial fracture suspected EXAM: CT HEAD WITHOUT CONTRAST CT MAXILLOFACIAL WITHOUT CONTRAST CT CERVICAL SPINE WITHOUT CONTRAST TECHNIQUE: Multidetector CT imaging of the head, cervical spine, and maxillofacial structures were performed using the standard protocol without intravenous contrast. Multiplanar CT image reconstructions of the cervical spine and maxillofacial structures were also generated. COMPARISON:  None. FINDINGS: CT HEAD FINDINGS Brain: No evidence of acute infarction, hemorrhage, hydrocephalus, extra-axial collection or mass lesion/mass effect. Vascular: Negative Skull: Facial fractures and skull base fractures described below. CT MAXILLOFACIAL FINDINGS Osseous: Nondisplaced right anterior mandible fracture extending between the canine and first premolar roots. No additional mandibular fracture is seen. No mandibular dislocation. Tripod fracture on the right with anterior and posterior walls right maxillary sinus, right orbital floor, lateral right orbit wall. The fracture complexes incomplete at the level of the zygomatic arch. There is mild depression of the right zygoma compared to the left. Right temporal bone fracture with hemotympanum. Fracture appears to spare the otic capsule. There is disruption of the malleoincudal joint. Central skull base fracture traversing the pterygoid  bodies and sphenoid sinuses. Posterior nasal septum shows segmental fracturing. Best seen on coronal reformats there is fracture offset along the right foramen rotundum. No carotid canal involvement. Right orbital roof fracture.  Small volume pneumocephalus. Orbits: Soft tissue gas from sinus fracturing on the right where there is also mild extraconal stranding. No hematoma or worrisome proptosis. Sinuses: Patchy hemosinus. Soft tissues: Facial contusion.  No opaque foreign body. CT CERVICAL SPINE FINDINGS Alignment: No traumatic malalignment Skull base and vertebrae: Negative for fracture Soft tissues and spinal canal: No prevertebral fluid or swelling. No visible canal hematoma. Disc levels:  Negative Upper chest: Reported separately. IMPRESSION: Maxillofacial CT: 1. Nondisplaced right anterior mandible fracture. 2. Tripod fracture on the right that is incomplete at the zygomatic arch. 3. Central skull base fracturing involving the sphenoid sinuses. This fracture is displaced at the right foramen rotundum. 4. Right temporal bone fracture with malleo-incudal subluxation. Head CT: 1. Negative for intracranial hemorrhage or visible contusion. 2. Small volume pneumocephalus. Cervical spine CT: No evidence of injury. Electronically Signed   By: Marnee Spring M.D.   On: 11/29/2017 12:39    Anti-infectives: Anti-infectives (From admission, onward)   Start     Dose/Rate Route Frequency Ordered Stop   11/29/17 2000  ceFAZolin (ANCEF) IVPB 2g/100 mL premix     2 g 200 mL/hr over 30 Minutes Intravenous Every 8 hours 11/29/17 1617 12/01/17 1959   11/29/17 1334  vancomycin (VANCOCIN) powder  Status:  Discontinued       As needed 11/29/17 1335 11/29/17 1547   11/29/17 1327  tobramycin (NEBCIN) powder  Status:  Discontinued       As needed 11/29/17 1334 11/29/17 1547      Assessment/Plan: s/p Procedure(s): INTRAMEDULLARY (IM) NAIL INTERTROCHANTRIC (Left) IRRIGATION AND DEBRIDEMENT EXTREMITY (Right) ear  LACERATION REPAIR (Left) APPLICATION OF WOUND VAC (Right) family is present and I discussed his injuries relative to his face. We talked about open reduction internal  fixation of mandible fracture. We discussed the risks, benefits, and options and all questions are answered. We also discussed a tracheotomy. The wife actually thinks the trach would be best. He has fairly severe emphysema and will possibly not come off the ventilator well. He will be nasally intubated coming out of the surgery if he has not had a  trach. This may be difficult to manage especially with his teeth wired shut. A tracheotomy will allow better management of the mandible fracture and wiring as well as his pulmonary status. We discussed this procedure as well including risks, benefits, and options. His right tripod fracture seems to be mostly lined up with not much orbital displacement. There is no evidence of entrapment. He has no displacement of the arch and his zygomatic frontal suture line looks to be lined up without significant fracture. I do not think that fracture needs to be fixed. We then talked about his ear again. He does have a facial nerve weakness by exam today and that's the first I have, been able to assess that. It still important to know whether he had initially some weakness but all reports are that there was not. Given this is a delayed response it makes decompression not a given. We will talk about this more after his above surgeries are completed and he's more awake to fully assess. All their questions were answered and consent was obtained.  LOS: 1 day    Suzanna Obey 11/30/2017

## 2017-11-30 NOTE — Progress Notes (Signed)
Richardo Hanks from [facility] [phone]) updated on pt's status by charge RN Amie Critchley.

## 2017-11-30 NOTE — Anesthesia Postprocedure Evaluation (Signed)
Anesthesia Post Note  Patient: Willie Blevins  Procedure(s) Performed: INTRAMEDULLARY (IM) NAIL INTERTROCHANTRIC (Left Leg Upper) IRRIGATION AND DEBRIDEMENT EXTREMITY (Right Leg Lower) APPLICATION OF WOUND VAC (Right Leg Lower) ear LACERATION REPAIR (Left Ear)     Patient location during evaluation: PACU Anesthesia Type: General Level of consciousness: awake and alert Pain management: pain level controlled Vital Signs Assessment: post-procedure vital signs reviewed and stable Respiratory status: spontaneous breathing, nonlabored ventilation, respiratory function stable and patient connected to nasal cannula oxygen Cardiovascular status: blood pressure returned to baseline and stable Postop Assessment: no apparent nausea or vomiting Anesthetic complications: no    Last Vitals:  Vitals:   11/30/17 0500 11/30/17 0600  BP: (!) 97/54 (!) 99/56  Pulse: 90 89  Resp: 16 16  Temp:    SpO2: 97% 98%    Last Pain:  Vitals:   11/30/17 0400  TempSrc: Axillary  PainSc:                  Trevor Iha

## 2017-11-30 NOTE — Addendum Note (Signed)
Addendum  created 11/30/17 0810 by Trevor Iha, MD   Sign clinical note

## 2017-11-30 NOTE — Progress Notes (Signed)
Follow up - Trauma and Critical Care  Patient Details:    Willie Blevins is an 42 y.o. male.  Lines/tubes : Airway 7.5 mm (Active)  Secured at (cm) 24 cm 11/30/2017  8:26 AM  Measured From Lips 11/30/2017  8:26 AM  Secured Location Left 11/30/2017  8:26 AM  Secured By [employer] 11/30/2017  8:26 AM  Tube Holder Repositioned Yes 11/30/2017  8:26 AM  Site Condition Dry 11/30/2017  8:26 AM     CVC Double Lumen 11/29/17 Right Internal jugular 16 cm (Active)  Indication for Insertion or Continuance of Line Vasoactive infusions 11/29/2017  8:00 PM  Site Assessment Clean;Dry;Intact 11/30/2017  4:00 AM  Proximal Lumen Status Infusing;Flushed 11/29/2017  8:00 PM  Distal Lumen Status Infusing;Flushed 11/29/2017  8:00 PM  Dressing Type Transparent;Occlusive 11/30/2017  4:00 AM  Dressing Status Clean;Dry;Intact;Antimicrobial disc in place 11/30/2017  4:00 AM  Line Care Connections checked and tightened 11/30/2017  4:00 AM  Dressing Intervention Dressing changed;Antimicrobial disc changed 11/30/2017  4:00 AM  Dressing Change Due 12/07/17 11/30/2017  4:00 AM     Arterial Line 11/29/17 Radial (Active)  Site Assessment Clean;Dry;Intact 11/29/2017  8:00 PM  Line Status Pulsatile blood flow 11/29/2017  8:00 PM  Art Line Waveform Appropriate 11/29/2017  8:00 PM  Art Line Interventions Zeroed and calibrated;Leveled;Connections checked and tightened 11/29/2017  8:00 PM  Color/Movement/Sensation Capillary refill less than 3 sec 11/29/2017  8:00 PM  Dressing Type Transparent;Occlusive 11/29/2017  8:00 PM  Dressing Status Clean;Dry;Intact 11/29/2017  8:00 PM  Dressing Change Due 12/06/17 11/29/2017  8:00 PM     Negative Pressure Wound Therapy Leg Right;Lateral;Lower (Active)  Cycle Continuous 11/29/2017  8:00 PM  Target Pressure (mmHg) 125 11/29/2017  8:00 PM  Dressing Status Intact 11/29/2017  8:00 PM  Drainage Amount Minimal 11/29/2017  8:00 PM  Drainage Description Sanguineous  11/29/2017  8:00 PM  Output (mL) 100 mL 11/30/2017  6:00 AM     NG/OG Tube Orogastric 16 Fr. Center mouth (Active)  Site Assessment Clean;Dry;Intact 11/29/2017  8:00 PM  Ongoing Placement Verification No change in respiratory status;No acute changes, not attributed to clinical condition;No change in cm markings or external length of tube from initial placement 11/29/2017  8:00 PM  Status Suction-low intermittent 11/29/2017  8:00 PM  Output (mL) 500 mL 11/30/2017  6:00 AM     Urethral Catheter Karsten Ro, RN Latex 16 Fr. (Active)  Indication for Insertion or Continuance of Catheter Unstable critical patients (first 24-48 hours) 11/29/2017  8:00 PM  Site Assessment Clean;Intact 11/29/2017  8:00 PM  Catheter Maintenance Bag below level of bladder;Catheter secured;Drainage bag/tubing not touching floor;Insertion date on drainage bag;No dependent loops;Seal intact 11/29/2017  8:00 PM  Collection Container Standard drainage bag 11/29/2017  8:00 PM  Securement Method Securing device (Describe) 11/29/2017  8:00 PM  Urinary Catheter Interventions Unclamped 11/29/2017  8:00 PM  Output (mL) 125 mL 11/30/2017  6:00 AM    Microbiology/Sepsis markers: No results found for this or any previous visit.  Anti-infectives:  Anti-infectives (From admission, onward)   Start     Dose/Rate Route Frequency Ordered Stop   11/29/17 2000  ceFAZolin (ANCEF) IVPB 2g/100 mL premix     2 g 200 mL/hr over 30 Minutes Intravenous Every 8 hours 11/29/17 1617 12/01/17 1959   11/29/17 1334  vancomycin (VANCOCIN) powder  Status:  Discontinued       As needed 11/29/17 1335 11/29/17 1547   11/29/17 1327  tobramycin (NEBCIN) powder  Status:  Discontinued       As needed 11/29/17 1334 11/29/17 1547        Consults: Treatment Team:  Md, Trauma, MD Myrene Galas, MD    Events:  Subjective:    Overnight Issues: No issues overnight  Objective:  Vital signs for last 24 hours: Temp:  [96.6 F (35.9 C)-100.4 F  (38 C)] 100.4 F (38 C) (10/16 0800) Pulse Rate:  [60-115] 89 (10/16 0826) Resp:  [0-24] 16 (10/16 0826) BP: (92-165)/(51-107) 92/52 (10/16 0826) SpO2:  [97 %-100 %] 98 % (10/16 0826) Arterial Line BP: (104-203)/(50-87) 104/52 (10/16 0600) FiO2 (%):  [40 %-100 %] 40 % (10/16 0826) Weight:  [99.8 kg] 99.8 kg (10/15 1112)  Hemodynamic parameters for last 24 hours:    Intake/Output from previous day: 10/15 0701 - 10/16 0700 In: 8940.1 [I.V.:7990; IV Piggyback:950.1] Out: 3125 [Urine:1975; Emesis/NG output:500; Drains:250; Blood:400]  Intake/Output this shift: No intake/output data recorded.  Vent settings for last 24 hours: Vent Mode: PRVC FiO2 (%):  [40 %-100 %] 40 % Set Rate:  [16 bmp] 16 bmp Vt Set:  [550 mL-650 mL] 650 mL PEEP:  [5 cmH20] 5 cmH20 Plateau Pressure:  [13 cmH20-18 cmH20] 16 cmH20  Physical Exam:  On vent, sedated, opening eyes and moving arms c-collar in place Lungs clear CV RRR Abdomen soft, non-distended Ext warm  Results for orders placed or performed during the hospital encounter of 11/29/17 (from the past 24 hour(s))  Prepare fresh frozen plasma     Status: None   Collection Time: 11/29/17 10:49 AM  Result Value Ref Range   Unit Number [id number]    Blood Component Type LIQ PLASMA    Unit division 00    Status of Unit REL FROM [facility]    Unit tag comment EMERGENCY RELEASE    Transfusion Status      OK TO TRANSFUSE Performed at [facility], [address], [place], [place] [address]    Unit Number [id number]    Blood Component Type LIQ PLASMA    Unit division 00    Status of Unit REL FROM [facility]    Unit tag comment EMERGENCY RELEASE    Transfusion Status OK TO TRANSFUSE   Type and screen Ordered by PROVIDER DEFAULT     Status: None (Preliminary result)   Collection Time: 11/29/17 11:09 AM  Result Value Ref Range   ABO/RH(D) B NEG    Antibody Screen NEG    Sample Expiration 12/02/2017    Unit Number [id number]    Blood  Component Type RBC LR PHER1    Unit division 00    Status of Unit REL FROM [facility]    Unit tag comment EMERGENCY RELEASE    Transfusion Status OK TO TRANSFUSE    Crossmatch Result NOT NEEDED    Unit Number [id number]    Blood Component Type RED CELLS,LR    Unit division 00    Status of Unit REL FROM [facility]    Unit tag comment EMERGENCY RELEASE    Transfusion Status OK TO TRANSFUSE    Crossmatch Result NOT NEEDED    Unit Number [id number]    Blood Component Type RED CELLS,LR    Unit division 00    Status of Unit ISSUED    Transfusion Status OK TO TRANSFUSE    Crossmatch Result Compatible    Unit Number [id number]    Blood Component Type RED CELLS,LR    Unit division 00    Status of Unit ALLOCATED  Transfusion Status OK TO TRANSFUSE    Crossmatch Result      Compatible Performed at [facility], [address], [place], [place] [address]   ABO/Rh     Status: None   Collection Time: 11/29/17 11:09 AM  Result Value Ref Range   ABO/RH(D)      B NEG Performed at [facility], [address] [place], [place] [address]   I-Stat CG4 Lactic Acid, ED     Status: Abnormal   Collection Time: 11/29/17 11:17 AM  Result Value Ref Range   Lactic Acid, Venous 3.13 (HH) 0.5 - 1.9 mmol/L   Comment NOTIFIED PHYSICIAN   I-stat chem 8, ed     Status: Abnormal   Collection Time: 11/29/17 11:17 AM  Result Value Ref Range   Sodium 137 135 - 145 mmol/L   Potassium 5.3 (H) 3.5 - 5.1 mmol/L   Chloride 107 98 - 111 mmol/L   BUN 27 (H) 6 - 20 mg/dL   Creatinine, Ser 0.98 0.61 - 1.24 mg/dL   Glucose, Bld 119 (H) 70 - 99 mg/dL   Calcium, Ion 1.47 (L) 1.15 - 1.40 mmol/L   TCO2 22 22 - 32 mmol/L   Hemoglobin 14.6 13.0 - 17.0 g/dL   HCT 82.9 56.2 - 13.0 %  Comprehensive metabolic panel     Status: Abnormal   Collection Time: 11/29/17 11:21 AM  Result Value Ref Range   Sodium 138 135 - 145 mmol/L   Potassium 3.8 3.5 - 5.1 mmol/L   Chloride 109 98 - 111 mmol/L   CO2 19 (L)  22 - 32 mmol/L   Glucose, Bld 164 (H) 70 - 99 mg/dL   BUN 16 6 - 20 mg/dL   Creatinine, Ser 8.65 0.61 - 1.24 mg/dL   Calcium 8.0 (L) 8.9 - 10.3 mg/dL   Total Protein 5.8 (L) 6.5 - 8.1 g/dL   Albumin 3.3 (L) 3.5 - 5.0 g/dL   AST 784 (H) 15 - 41 U/L   ALT 112 (H) 0 - 44 U/L   Alkaline Phosphatase 75 38 - 126 U/L   Total Bilirubin 0.5 0.3 - 1.2 mg/dL   GFR calc non Af Amer >60 >60 mL/min   GFR calc Af Amer >60 >60 mL/min   Anion gap 10 5 - 15  Ethanol     Status: None   Collection Time: 11/29/17 11:21 AM  Result Value Ref Range   Alcohol, Ethyl (B) <10 <10 mg/dL  CBC with Differential     Status: Abnormal   Collection Time: 11/29/17 11:21 AM  Result Value Ref Range   WBC 19.6 (H) 4.0 - 10.5 K/uL   RBC 5.00 4.22 - 5.81 MIL/uL   Hemoglobin 14.9 13.0 - 17.0 g/dL   HCT 69.6 29.5 - 28.4 %   MCV 92.0 80.0 - 100.0 fL   MCH 29.8 26.0 - 34.0 pg   MCHC 32.4 30.0 - 36.0 g/dL   RDW 13.2 44.0 - 10.2 %   Platelets 230 150 - 400 K/uL   nRBC 0.0 0.0 - 0.2 %   Neutrophils Relative % 74 %   Neutro Abs 14.5 (H) 1.7 - 7.7 K/uL   Lymphocytes Relative 18 %   Lymphs Abs 3.5 0.7 - 4.0 K/uL   Monocytes Relative 5 %   Monocytes Absolute 1.0 0.1 - 1.0 K/uL   Eosinophils Relative 2 %   Eosinophils Absolute 0.4 0.0 - 0.5 K/uL   Basophils Relative 0 %  Basophils Absolute 0.1 0.0 - 0.1 K/uL   Immature Granulocytes 1 %   Abs Immature Granulocytes 0.17 (H) 0.00 - 0.07 K/uL  Protime-INR     Status: None   Collection Time: 11/29/17 11:21 AM  Result Value Ref Range   Prothrombin Time 13.9 11.4 - 15.2 seconds   INR 1.08   Triglycerides     Status: Abnormal   Collection Time: 11/29/17 11:23 AM  Result Value Ref Range   Triglycerides 198 (H) <150 mg/dL  I-STAT 7, (LYTES, BLD GAS, ICA, H+H)     Status: Abnormal   Collection Time: 11/29/17 12:19 PM  Result Value Ref Range   pH, Arterial 7.250 (L) 7.350 - 7.450   pCO2 arterial 48.8 (H) 32.0 - 48.0 mmHg   pO2, Arterial 139.0 (H) 83.0 - 108.0 mmHg    Bicarbonate 21.4 20.0 - 28.0 mmol/L   TCO2 23 22 - 32 mmol/L   O2 Saturation 99.0 %   Acid-base deficit 6.0 (H) 0.0 - 2.0 mmol/L   Sodium 138 135 - 145 mmol/L   Potassium 3.2 (L) 3.5 - 5.1 mmol/L   Calcium, Ion 1.14 (L) 1.15 - 1.40 mmol/L   HCT 43.0 39.0 - 52.0 %   Hemoglobin 14.6 13.0 - 17.0 g/dL   Patient temperature HIDE    Sample type ARTERIAL   CK     Status: Abnormal   Collection Time: 11/29/17 12:50 PM  Result Value Ref Range   Total CK 1,815 (H) 49 - 397 U/L  Lactic acid, plasma     Status: None   Collection Time: 11/29/17 12:50 PM  Result Value Ref Range   Lactic Acid, Venous 1.4 0.5 - 1.9 mmol/L  HIV antibody (Routine Testing)     Status: None   Collection Time: 11/29/17  1:26 PM  Result Value Ref Range   HIV Screen 4th Generation wRfx Non Reactive Non Reactive  Triglycerides     Status: None   Collection Time: 11/29/17  1:36 PM  Result Value Ref Range   Triglycerides 102 <150 mg/dL  I-STAT 7, (LYTES, BLD GAS, ICA, H+H)     Status: Abnormal   Collection Time: 11/29/17  1:39 PM  Result Value Ref Range   pH, Arterial 7.280 (L) 7.350 - 7.450   pCO2 arterial 45.0 32.0 - 48.0 mmHg   pO2, Arterial 103.0 83.0 - 108.0 mmHg   Bicarbonate 21.7 20.0 - 28.0 mmol/L   TCO2 23 22 - 32 mmol/L   O2 Saturation 98.0 %   Acid-base deficit 6.0 (H) 0.0 - 2.0 mmol/L   Sodium 140 135 - 145 mmol/L   Potassium 3.8 3.5 - 5.1 mmol/L   Calcium, Ion 1.14 (L) 1.15 - 1.40 mmol/L   HCT 36.0 (L) 39.0 - 52.0 %   Hemoglobin 12.2 (L) 13.0 - 17.0 g/dL   Patient temperature 16.1 C    Sample type ARTERIAL   I-STAT 7, (LYTES, BLD GAS, ICA, H+H)     Status: Abnormal   Collection Time: 11/29/17  2:42 PM  Result Value Ref Range   pH, Arterial 7.339 (L) 7.350 - 7.450   pCO2 arterial 39.1 32.0 - 48.0 mmHg   pO2, Arterial 100.0 83.0 - 108.0 mmHg   Bicarbonate 21.6 20.0 - 28.0 mmol/L   TCO2 23 22 - 32 mmol/L   O2 Saturation 98.0 %   Acid-base deficit 5.0 (H) 0.0 - 2.0 mmol/L   Sodium 138 135 - 145  mmol/L   Potassium 4.2 3.5 - 5.1 mmol/L  Calcium, Ion 1.11 (L) 1.15 - 1.40 mmol/L   HCT 29.0 (L) 39.0 - 52.0 %   Hemoglobin 9.9 (L) 13.0 - 17.0 g/dL   Patient temperature 16.1 C    Sample type ARTERIAL   Prepare RBC     Status: None   Collection Time: 11/29/17  3:05 PM  Result Value Ref Range   Order Confirmation      ORDER PROCESSED BY BLOOD BANK Performed at [facility], [address], [place], [place] [address]   CK     Status: Abnormal   Collection Time: 11/29/17  7:09 PM  Result Value Ref Range   Total CK 4,575 (H) 49 - 397 U/L  I-STAT 3, arterial blood gas (G3+)     Status: Abnormal   Collection Time: 11/30/17  1:36 AM  Result Value Ref Range   pH, Arterial 7.329 (L) 7.350 - 7.450   pCO2 arterial 39.7 32.0 - 48.0 mmHg   pO2, Arterial 74.0 (L) 83.0 - 108.0 mmHg   Bicarbonate 20.9 20.0 - 28.0 mmol/L   TCO2 22 22 - 32 mmol/L   O2 Saturation 94.0 %   Acid-base deficit 5.0 (H) 0.0 - 2.0 mmol/L   Patient temperature 98.4 F    Collection site ARTERIAL LINE    Drawn by Operator    Sample type ARTERIAL   CBC     Status: Abnormal   Collection Time: 11/30/17  5:35 AM  Result Value Ref Range   WBC 9.7 4.0 - 10.5 K/uL   RBC 3.69 (L) 4.22 - 5.81 MIL/uL   Hemoglobin 11.0 (L) 13.0 - 17.0 g/dL   HCT 54.0 (L) 98.1 - 19.1 %   MCV 90.0 80.0 - 100.0 fL   MCH 29.8 26.0 - 34.0 pg   MCHC 33.1 30.0 - 36.0 g/dL   RDW 47.8 29.5 - 62.1 %   Platelets 182 150 - 400 K/uL   nRBC 0.0 0.0 - 0.2 %  Basic metabolic panel     Status: Abnormal   Collection Time: 11/30/17  5:35 AM  Result Value Ref Range   Sodium 133 (L) 135 - 145 mmol/L   Potassium 4.2 3.5 - 5.1 mmol/L   Chloride 102 98 - 111 mmol/L   CO2 22 22 - 32 mmol/L   Glucose, Bld 136 (H) 70 - 99 mg/dL   BUN 9 6 - 20 mg/dL   Creatinine, Ser 3.08 0.61 - 1.24 mg/dL   Calcium 7.2 (L) 8.9 - 10.3 mg/dL   GFR calc non Af Amer >60 >60 mL/min   GFR calc Af Amer >60 >60 mL/min   Anion gap 9 5 - 15  CK     Status: Abnormal    Collection Time: 11/30/17  5:35 AM  Result Value Ref Range   Total CK 3,659 (H) 49 - 397 U/L  Lactic acid, plasma     Status: Abnormal   Collection Time: 11/30/17  5:35 AM  Result Value Ref Range   Lactic Acid, Venous 3.0 (HH) 0.5 - 1.9 mmol/L     Assessment/Plan:   Hit by tree L subtroch femur fx - to OR with ortho Dr. Carola Frost R open fibula fx - to OR with ortho Dr. Carola Frost R ear lac - Dr. Janee Morn to suture in the OR L rib #11 fx - pain control and pulmonary toilet Grade 2 splenic lac - hgb stable Grade 1 liver lac - hgb stable R adrenal hemorrhage - monitor hg, no intervention needed R maxillary sinus  fx/ R lateral wall orbit fx/ R mastoid process fx/ Nasal septum fx - ENT Dr. Jearld Fenton following Skull fracture without intracranial injury  To OR tomorrow per Ortho.  Will stay on vent today.  Dsean Vantol A 11/30/2017    Patient ID: Willie Blevins, male   DOB: 03-Jun-1975, 42 y.o.   MRN: [id number]

## 2017-11-30 NOTE — Anesthesia Preprocedure Evaluation (Addendum)
Anesthesia Evaluation  Patient identified by MRN, date of birth, ID band Patient awake    Reviewed: Allergy & Precautions, NPO status , Patient's Chart, lab work & pertinent test results  Airway Mallampati: Intubated       Dental   Pulmonary       + intubated    Cardiovascular  Rhythm:Regular Rate:Tachycardia     Neuro/Psych    GI/Hepatic   Endo/Other    Renal/GU      Musculoskeletal   Abdominal   Peds  Hematology  (+) anemia ,   Anesthesia Other Findings   Reproductive/Obstetrics                           Anesthesia Physical Anesthesia Plan  ASA: II  Anesthesia Plan: General   Post-op Pain Management:    Induction:   PONV Risk Score and Plan:   Airway Management Planned:   Additional Equipment:   Intra-op Plan:   Post-operative Plan:   Informed Consent:   Plan Discussed with:   Anesthesia Plan Comments:        Anesthesia Quick Evaluation
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
## 2017-12-01 ENCOUNTER — Inpatient Hospital Stay (HOSPITAL_COMMUNITY): Payer: Worker's Compensation | Admitting: Anesthesiology

## 2017-12-01 ENCOUNTER — Inpatient Hospital Stay (HOSPITAL_COMMUNITY): Payer: Worker's Compensation

## 2017-12-01 ENCOUNTER — Encounter (HOSPITAL_COMMUNITY): Admission: EM | Disposition: A | Discharge status: Rehabilitation Facility | Payer: Self-pay | Source: Home / Self Care

## 2017-12-01 HISTORY — PX: TRACHEOSTOMY TUBE PLACEMENT: SHX814

## 2017-12-01 HISTORY — PX: I & D EXTREMITY: SHX5045

## 2017-12-01 HISTORY — PX: ORIF MANDIBULAR FRACTURE: SHX2127

## 2017-12-01 LAB — CBC
HCT: 29.3 % — ABNORMAL LOW (ref 39.0–52.0)
HEMOGLOBIN: 9.3 g/dL — AB (ref 13.0–17.0)
MCH: 28.8 pg (ref 26.0–34.0)
MCHC: 31.7 g/dL (ref 30.0–36.0)
MCV: 90.7 fL (ref 80.0–100.0)
Platelets: 161 10*3/uL (ref 150–400)
RBC: 3.23 MIL/uL — AB (ref 4.22–5.81)
RDW: 13.9 % (ref 11.5–15.5)
WBC: 9.6 10*3/uL (ref 4.0–10.5)
nRBC: 0 % (ref 0.0–0.2)

## 2017-12-01 LAB — BASIC METABOLIC PANEL
ANION GAP: 6 (ref 5–15)
BUN: 7 mg/dL (ref 6–20)
CO2: 24 mmol/L (ref 22–32)
Calcium: 7.2 mg/dL — ABNORMAL LOW (ref 8.9–10.3)
Chloride: 102 mmol/L (ref 98–111)
Creatinine, Ser: 0.81 mg/dL (ref 0.61–1.24)
GFR calc Af Amer: >60 mL/min (ref >60)
GLUCOSE: 101 mg/dL — AB (ref 70–99)
POTASSIUM: 3.6 mmol/L (ref 3.5–5.1)
Sodium: 132 mmol/L — ABNORMAL LOW (ref 135–145)

## 2017-12-01 LAB — CK: Total CK: 3966 U/L — ABNORMAL HIGH (ref 49–397)

## 2017-12-01 SURGERY — OPEN REDUCTION INTERNAL FIXATION (ORIF) MANDIBULAR FRACTURE
Anesthesia: General | Site: Neck | Laterality: Right

## 2017-12-01 MED ORDER — ROCURONIUM BROMIDE 50 MG/5ML IV SOSY
PREFILLED_SYRINGE | INTRAVENOUS | Status: AC
  Filled 2017-12-01: qty 5

## 2017-12-01 MED ORDER — LIDOCAINE-EPINEPHRINE 1 %-1:100000 IJ SOLN
INTRAMUSCULAR | Status: AC
  Filled 2017-12-01: qty 1

## 2017-12-01 MED ORDER — FENTANYL CITRATE (PF) 250 MCG/5ML IJ SOLN
INTRAMUSCULAR | Status: AC
  Filled 2017-12-01: qty 5

## 2017-12-01 MED ORDER — BACITRACIN ZINC 500 UNIT/GM EX OINT
TOPICAL_OINTMENT | CUTANEOUS | Status: AC
  Filled 2017-12-01: qty 28.35

## 2017-12-01 MED ORDER — OXYMETAZOLINE HCL 0.05 % NA SOLN
NASAL | Status: AC
  Filled 2017-12-01: qty 15

## 2017-12-01 MED ORDER — ONDANSETRON HCL 4 MG/2ML IJ SOLN
INTRAMUSCULAR | Status: DC | PRN
  Administered 2017-12-01: 4 mg via INTRAVENOUS

## 2017-12-01 MED ORDER — VANCOMYCIN HCL 1000 MG IV SOLR
INTRAVENOUS | Status: DC | PRN
  Administered 2017-12-01: 1000 mg via TOPICAL

## 2017-12-01 MED ORDER — ROCURONIUM BROMIDE 50 MG/5ML IV SOSY
PREFILLED_SYRINGE | INTRAVENOUS | Status: DC | PRN
  Administered 2017-12-01: 50 mg via INTRAVENOUS
  Administered 2017-12-01: 20 mg via INTRAVENOUS
  Administered 2017-12-01: 30 mg via INTRAVENOUS

## 2017-12-01 MED ORDER — TOBRAMYCIN SULFATE 1.2 G IJ SOLR
INTRAMUSCULAR | Status: AC
  Filled 2017-12-01: qty 1.2

## 2017-12-01 MED ORDER — LIDOCAINE-EPINEPHRINE 1 %-1:100000 IJ SOLN
INTRAMUSCULAR | Status: DC | PRN
  Administered 2017-12-01: 2 mL

## 2017-12-01 MED ORDER — ONDANSETRON HCL 4 MG/2ML IJ SOLN
INTRAMUSCULAR | Status: AC
  Filled 2017-12-01: qty 2

## 2017-12-01 MED ORDER — DIPHENHYDRAMINE HCL 50 MG/ML IJ SOLN
INTRAMUSCULAR | Status: AC
  Filled 2017-12-01: qty 1

## 2017-12-01 MED ORDER — LACTATED RINGERS IV SOLN
INTRAVENOUS | Status: DC | PRN
  Administered 2017-12-01 (×2): via INTRAVENOUS

## 2017-12-01 MED ORDER — PROPOFOL 10 MG/ML IV BOLUS
INTRAVENOUS | Status: DC | PRN
  Administered 2017-12-01: 50 mg via INTRAVENOUS

## 2017-12-01 MED ORDER — VANCOMYCIN HCL 1000 MG IV SOLR
INTRAVENOUS | Status: AC
  Filled 2017-12-01: qty 1000

## 2017-12-01 MED ORDER — DEXAMETHASONE SODIUM PHOSPHATE 10 MG/ML IJ SOLN
INTRAMUSCULAR | Status: DC | PRN
  Administered 2017-12-01: 10 mg via INTRAVENOUS

## 2017-12-01 MED ORDER — 0.9 % SODIUM CHLORIDE (POUR BTL) OPTIME
TOPICAL | Status: DC | PRN
  Administered 2017-12-01 (×3): 1000 mL

## 2017-12-01 MED ORDER — DEXAMETHASONE SODIUM PHOSPHATE 10 MG/ML IJ SOLN
INTRAMUSCULAR | Status: AC
  Filled 2017-12-01: qty 1

## 2017-12-01 MED ORDER — TOBRAMYCIN SULFATE 1.2 G IJ SOLR
INTRAMUSCULAR | Status: DC | PRN
  Administered 2017-12-01: 1.2 g via TOPICAL

## 2017-12-01 MED ORDER — SODIUM CHLORIDE 0.9 % IV SOLN
INTRAVENOUS | Status: DC | PRN
  Administered 2017-12-01: 125 ug/h via INTRAVENOUS

## 2017-12-01 MED ORDER — PROPOFOL 10 MG/ML IV BOLUS
INTRAVENOUS | Status: AC
  Filled 2017-12-01: qty 20

## 2017-12-01 MED ORDER — DIPHENHYDRAMINE HCL 50 MG/ML IJ SOLN
INTRAMUSCULAR | Status: DC | PRN
  Administered 2017-12-01: 12.5 mg via INTRAVENOUS

## 2017-12-01 MED ORDER — FENTANYL CITRATE (PF) 100 MCG/2ML IJ SOLN
INTRAMUSCULAR | Status: DC | PRN
  Administered 2017-12-01 (×2): 100 ug via INTRAVENOUS
  Administered 2017-12-01: 50 ug via INTRAVENOUS

## 2017-12-01 SURGICAL SUPPLY — 101 items
BANDAGE ELASTIC 4 VELCRO ST LF (GAUZE/BANDAGES/DRESSINGS) ×15 IMPLANT
BANDAGE ELASTIC 6 VELCRO ST LF (GAUZE/BANDAGES/DRESSINGS) ×5 IMPLANT
BAR FIX PREFORMED OMNIMAX (Miscellaneous) ×10 IMPLANT
BIT DRILL DEPTH MARK1.8X115 26 (MISCELLANEOUS) ×3 IMPLANT
BIT DRILL STOP 1.8X50X26 (MISCELLANEOUS) ×3 IMPLANT
BLADE CLIPPER SURG (BLADE) IMPLANT
BLADE SURG 10 STRL SS (BLADE) ×5 IMPLANT
BLADE SURG 15 STRL LF DISP TIS (BLADE) IMPLANT
BLADE SURG 15 STRL SS (BLADE)
BNDG COHESIVE 4X5 TAN STRL (GAUZE/BANDAGES/DRESSINGS) ×5 IMPLANT
BNDG GAUZE ELAST 4 BULKY (GAUZE/BANDAGES/DRESSINGS) ×10 IMPLANT
BNDG GAUZE STRTCH 6 (GAUZE/BANDAGES/DRESSINGS) ×15 IMPLANT
BRUSH SCRUB SURG 4.25 DISP (MISCELLANEOUS) ×10 IMPLANT
CANISTER SUCT 3000ML PPV (MISCELLANEOUS) ×5 IMPLANT
CANISTER WOUND CARE 500ML ATS (WOUND CARE) ×5 IMPLANT
CLEANER TIP ELECTROSURG 2X2 (MISCELLANEOUS) ×10 IMPLANT
CLOSURE WOUND 1/2 X4 (GAUZE/BANDAGES/DRESSINGS)
CONFORMERS SILICONE 5649 (OPHTHALMIC RELATED) IMPLANT
COVER SURGICAL LIGHT HANDLE (MISCELLANEOUS) ×20 IMPLANT
COVER WAND RF STERILE (DRAPES) ×10 IMPLANT
CRADLE DONUT ADULT HEAD (MISCELLANEOUS) IMPLANT
DECANTER SPIKE VIAL GLASS SM (MISCELLANEOUS) ×5 IMPLANT
DRAPE HALF SHEET 40X57 (DRAPES) IMPLANT
DRAPE U-SHAPE 47X51 STRL (DRAPES) ×5 IMPLANT
DRILL DEPTH MARK1.8X115 26 (MISCELLANEOUS) ×5
DRILL STOP 1.8X50X26 (MISCELLANEOUS) ×5
DRSG ADAPTIC 3X8 NADH LF (GAUZE/BANDAGES/DRESSINGS) ×5 IMPLANT
ELECT COATED BLADE 2.86 ST (ELECTRODE) ×5 IMPLANT
ELECT NEEDLE BLADE 2-5/6 (NEEDLE) IMPLANT
ELECT REM PT RETURN 9FT ADLT (ELECTROSURGICAL) ×5
ELECTRODE REM PT RTRN 9FT ADLT (ELECTROSURGICAL) ×3 IMPLANT
GAUZE 4X4 16PLY RFD (DISPOSABLE) ×5 IMPLANT
GAUZE SPONGE 4X4 12PLY STRL (GAUZE/BANDAGES/DRESSINGS) ×5 IMPLANT
GLOVE BIO SURGEON STRL SZ7.5 (GLOVE) ×5 IMPLANT
GLOVE BIO SURGEON STRL SZ8 (GLOVE) ×5 IMPLANT
GLOVE BIOGEL PI IND STRL 7.5 (GLOVE) ×3 IMPLANT
GLOVE BIOGEL PI IND STRL 8 (GLOVE) ×3 IMPLANT
GLOVE BIOGEL PI INDICATOR 7.5 (GLOVE) ×2
GLOVE BIOGEL PI INDICATOR 8 (GLOVE) ×2
GLOVE ECLIPSE 7.5 STRL STRAW (GLOVE) ×10 IMPLANT
GOWN STRL REUS W/ TWL LRG LVL3 (GOWN DISPOSABLE) ×12 IMPLANT
GOWN STRL REUS W/ TWL XL LVL3 (GOWN DISPOSABLE) ×3 IMPLANT
GOWN STRL REUS W/TWL LRG LVL3 (GOWN DISPOSABLE) ×8
GOWN STRL REUS W/TWL XL LVL3 (GOWN DISPOSABLE) ×2
HANDPIECE INTERPULSE COAX TIP (DISPOSABLE)
HOLDER TRACH TUBE VELCRO 19.5 (MISCELLANEOUS) ×5 IMPLANT
KIT BASIN OR (CUSTOM PROCEDURE TRAY) ×10 IMPLANT
KIT PREVENA INCISION MGT20CM45 (CANNISTER) ×5 IMPLANT
KIT TURNOVER KIT B (KITS) ×10 IMPLANT
MANIFOLD NEPTUNE II (INSTRUMENTS) ×5 IMPLANT
NEEDLE HYPO 25GX1X1/2 BEV (NEEDLE) ×5 IMPLANT
NS IRRIG 1000ML POUR BTL (IV SOLUTION) ×10 IMPLANT
PACK ORTHO EXTREMITY (CUSTOM PROCEDURE TRAY) ×5 IMPLANT
PAD ABD 8X10 STRL (GAUZE/BANDAGES/DRESSINGS) ×5 IMPLANT
PAD ARMBOARD 7.5X6 YLW CONV (MISCELLANEOUS) ×20 IMPLANT
PAD CAST 4YDX4 CTTN HI CHSV (CAST SUPPLIES) ×6 IMPLANT
PADDING CAST COTTON 4X4 STRL (CAST SUPPLIES) ×4
PADDING CAST COTTON 6X4 STRL (CAST SUPPLIES) ×5 IMPLANT
PATTIES SURGICAL .5 X3 (DISPOSABLE) IMPLANT
PENCIL FOOT CONTROL (ELECTRODE) ×5 IMPLANT
PLATE LOCK 4H STRT 1.6 GOLD (Plate) ×5 IMPLANT
PLATE RGD 1.5/0.6 4 HOLE (Plate) ×5 IMPLANT
SCREW BONE MANDIB SD 2X9 (Screw) ×45 IMPLANT
SCREW HT SD X-DR 1.5X5 (Screw) ×20 IMPLANT
SCREW NON LOCK X-DR 2.3X10 (Screw) ×5 IMPLANT
SCREW NON LOCK X-DR 2.3X12 (Screw) ×15 IMPLANT
SCREW X-DR EMERG 2.7X10 (Screw) ×5 IMPLANT
SET HNDPC FAN SPRY TIP SCT (DISPOSABLE) IMPLANT
SPONGE DRAIN TRACH 4X4 STRL 2S (GAUZE/BANDAGES/DRESSINGS) IMPLANT
SPONGE LAP 18X18 X RAY DECT (DISPOSABLE) ×5 IMPLANT
STOCKINETTE IMPERVIOUS 9X36 MD (GAUZE/BANDAGES/DRESSINGS) ×5 IMPLANT
STRIP CLOSURE SKIN 1/2X4 (GAUZE/BANDAGES/DRESSINGS) IMPLANT
SURGILUBE 2OZ TUBE FLIPTOP (MISCELLANEOUS) IMPLANT
SUT CHROMIC 3 0 PS 2 (SUTURE) ×5 IMPLANT
SUT CHROMIC 4 0 PS 2 18 (SUTURE) ×5 IMPLANT
SUT CHROMIC GUT 2 0 PS 2 27 (SUTURE) ×5 IMPLANT
SUT ETHILON 1 TP 1 60 (SUTURE) ×5 IMPLANT
SUT ETHILON 2 0 PSLX (SUTURE) ×5 IMPLANT
SUT ETHILON 3 0 PS 1 (SUTURE) ×5 IMPLANT
SUT ETHILON 5 0 P 3 18 (SUTURE) ×2
SUT ETHILON O TP 1 (SUTURE) ×5 IMPLANT
SUT NYLON ETHILON 5-0 P-3 1X18 (SUTURE) ×3 IMPLANT
SUT PDS AB 2-0 CT1 27 (SUTURE) ×5 IMPLANT
SUT SILK 2 0 PERMA HAND 18 BK (SUTURE) IMPLANT
SUT SILK 3 0 SH 30 (SUTURE) ×10 IMPLANT
SUT SILK 3 0 TIES 17X18 (SUTURE) ×4
SUT SILK 3-0 18XBRD TIE BLK (SUTURE) ×6 IMPLANT
SUT STEEL 0 (SUTURE)
SUT STEEL 0 18XMFL TIE 17 (SUTURE) IMPLANT
SUT STEEL 4 (SUTURE) ×5 IMPLANT
SWAB CULTURE ESWAB REG 1ML (MISCELLANEOUS) IMPLANT
SYR CONTROL 10ML LL (SYRINGE) ×5 IMPLANT
TOWEL OR 17X24 6PK STRL BLUE (TOWEL DISPOSABLE) ×10 IMPLANT
TOWEL OR 17X26 10 PK STRL BLUE (TOWEL DISPOSABLE) ×10 IMPLANT
TRAY ENT MC OR (CUSTOM PROCEDURE TRAY) ×5 IMPLANT
TUBE CONNECTING 12'X1/4 (SUCTIONS) ×2
TUBE CONNECTING 12X1/4 (SUCTIONS) ×8 IMPLANT
TUBE TRACH SHILEY  6 DIST  CUF (TUBING) ×5 IMPLANT
UNDERPAD 30X30 (UNDERPADS AND DIAPERS) ×5 IMPLANT
WATER STERILE IRR 1000ML POUR (IV SOLUTION) ×10 IMPLANT
YANKAUER SUCT BULB TIP NO VENT (SUCTIONS) ×5 IMPLANT
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
## 2017-12-01 NOTE — Brief Op Note (Signed)
12/01/2017  11:21 AM  PATIENT:  Willie Blevins  42 y.o. male  PRE-OPERATIVE DIAGNOSIS:  right open tibia fracture  POST-OPERATIVE DIAGNOSIS:  right open tibia fracture  PROCEDURE:  Procedure(s): Panel 1: OPEN REDUCTION INTERNAL FIXATION (ORIF) MANDIBULAR FRACTURE (N/A) TRACHEOSTOMY (N/A) Panel 2: 1. SECOND LOOK IRRIGATION AND DEBRIDEMENT RIGHT LOWER EXTREMITY CRUSHING WITH EXCISION OF MUSCLE AND FAT 2. RETENTION SUTURE CLOSURE 14 CM 3. DRESSING CHANGE UNDER ANESTHESIA (Right)  SURGEON:  Surgeon(s) and Role: Panel 1:    Suzanna Obey, MD - Primary  Panel 2:    Myrene Galas, MD - Primary Panel 2:     PHYSICIAN ASSISTANT: Montez Morita, PA-C  ANESTHESIA:   general  EBL:  20 mL   BLOOD ADMINISTERED:none  DRAINS: none  LOCAL MEDICATIONS USED:  NONE  SPECIMEN:  No Specimen  DISPOSITION OF SPECIMEN:  N/A  COUNTS:  YES  TOURNIQUET:  * No tourniquets in log *  DICTATION: .Other Dictation: Dictation Number [id number]  PLAN OF CARE: Admit to inpatient   PATIENT DISPOSITION:  ICU - intubated and hemodynamically stable.   Delay start of Pharmacological VTE agent (>24hrs) due to surgical blood loss or risk of bleeding: no

## 2017-12-01 NOTE — Progress Notes (Signed)
Trauma Service Note  Subjective: Patient on his way to surgery for trach and mandible fixation.  He was alert and cooperative.  Objective: Vital signs in last 24 hours: Temp:  [98.5 F (36.9 C)-100.6 F (38.1 C)] 99.2 F (37.3 C) (10/17 0400) Pulse Rate:  [76-103] 80 (10/17 1115) Resp:  [14-19] 16 (10/17 1115) BP: (85-136)/(48-76) 136/51 (10/17 1115) SpO2:  [93 %-99 %] 99 % (10/17 1115) Arterial Line BP: (98-143)/(45-68) 129/52 (10/17 0700) FiO2 (%):  [40 %] 40 % (10/17 1115) Last BM Date: (pta)  Intake/Output from previous day: 10/16 0701 - 10/17 0700 In: 4812.3 [I.V.:4462.4; IV Piggyback:349.9] Out: 2645 [Urine:2550; Drains:95] Intake/Output this shift: Total I/O In: 1100 [I.V.:1100] Out: 820 [Urine:800; Blood:20]  General: No acute distress  Lungs: Clear  Abd: Benign  Extremities: No changes  Neuro: Intact  Lab Results: CBC  Recent Labs    11/30/17 0535 12/01/17 0345  WBC 9.7 9.6  HGB 11.0* 9.3*  HCT 33.2* 29.3*  PLT 182 161   BMET Recent Labs    11/30/17 0535 12/01/17 0345  NA 133* 132*  K 4.2 3.6  CL 102 102  CO2 22 24  GLUCOSE 136* 101*  BUN 9 7  CREATININE 0.92 0.81  CALCIUM 7.2* 7.2*   PT/INR Recent Labs    11/29/17 1121  LABPROT 13.9  INR 1.08   ABG Recent Labs    11/29/17 1442 11/30/17 0136  PHART 7.339* 7.329*  HCO3 21.6 20.9    Studies/Results: Dg Tibia/fibula Left  Result Date: 11/29/2017 CLINICAL DATA:  Irrigation of right lower extremity wound for open tibial fracture. EXAM: RIGHT TIBIA AND FIBULA - 2 VIEW; DG C-ARM 61-120 MIN; LEFT TIBIA AND FIBULA - 2 VIEW FLUOROSCOPY TIME:  21 seconds. COMPARISON:  Radiographs of same day. FINDINGS: Four intraoperative fluoroscopic images were obtained of the right lower leg. These demonstrate severely comminuted and displaced fracture involving the midshaft of the right tibia with overlying soft tissue laceration or wound. Three intraoperative fluoroscopic images of the left tibia  and fibula demonstrate mildly displaced and comminuted left fibular shaft fracture. IMPRESSION: Fluoroscopic guidance and evaluation was performed of bilateral fibular fractures. Electronically Signed   By: Lupita Raider, M.D.   On: 11/29/2017 16:21   Dg Tibia/fibula Right  Result Date: 11/29/2017 CLINICAL DATA:  Irrigation of right lower extremity wound for open tibial fracture. EXAM: RIGHT TIBIA AND FIBULA - 2 VIEW; DG C-ARM 61-120 MIN; LEFT TIBIA AND FIBULA - 2 VIEW FLUOROSCOPY TIME:  21 seconds. COMPARISON:  Radiographs of same day. FINDINGS: Four intraoperative fluoroscopic images were obtained of the right lower leg. These demonstrate severely comminuted and displaced fracture involving the midshaft of the right tibia with overlying soft tissue laceration or wound. Three intraoperative fluoroscopic images of the left tibia and fibula demonstrate mildly displaced and comminuted left fibular shaft fracture. IMPRESSION: Fluoroscopic guidance and evaluation was performed of bilateral fibular fractures. Electronically Signed   By: Lupita Raider, M.D.   On: 11/29/2017 16:21   Ct Head Wo Contrast  Result Date: 11/29/2017 CLINICAL DATA:  Trauma with facial fracture suspected EXAM: CT HEAD WITHOUT CONTRAST CT MAXILLOFACIAL WITHOUT CONTRAST CT CERVICAL SPINE WITHOUT CONTRAST TECHNIQUE: Multidetector CT imaging of the head, cervical spine, and maxillofacial structures were performed using the standard protocol without intravenous contrast. Multiplanar CT image reconstructions of the cervical spine and maxillofacial structures were also generated. COMPARISON:  None. FINDINGS: CT HEAD FINDINGS Brain: No evidence of acute infarction, hemorrhage, hydrocephalus, extra-axial collection or mass  lesion/mass effect. Vascular: Negative Skull: Facial fractures and skull base fractures described below. CT MAXILLOFACIAL FINDINGS Osseous: Nondisplaced right anterior mandible fracture extending between the canine and  first premolar roots. No additional mandibular fracture is seen. No mandibular dislocation. Tripod fracture on the right with anterior and posterior walls right maxillary sinus, right orbital floor, lateral right orbit wall. The fracture complexes incomplete at the level of the zygomatic arch. There is mild depression of the right zygoma compared to the left. Right temporal bone fracture with hemotympanum. Fracture appears to spare the otic capsule. There is disruption of the malleoincudal joint. Central skull base fracture traversing the pterygoid bodies and sphenoid sinuses. Posterior nasal septum shows segmental fracturing. Best seen on coronal reformats there is fracture offset along the right foramen rotundum. No carotid canal involvement. Right orbital roof fracture.  Small volume pneumocephalus. Orbits: Soft tissue gas from sinus fracturing on the right where there is also mild extraconal stranding. No hematoma or worrisome proptosis. Sinuses: Patchy hemosinus. Soft tissues: Facial contusion.  No opaque foreign body. CT CERVICAL SPINE FINDINGS Alignment: No traumatic malalignment Skull base and vertebrae: Negative for fracture Soft tissues and spinal canal: No prevertebral fluid or swelling. No visible canal hematoma. Disc levels:  Negative Upper chest: Reported separately. IMPRESSION: Maxillofacial CT: 1. Nondisplaced right anterior mandible fracture. 2. Tripod fracture on the right that is incomplete at the zygomatic arch. 3. Central skull base fracturing involving the sphenoid sinuses. This fracture is displaced at the right foramen rotundum. 4. Right temporal bone fracture with malleo-incudal subluxation. Head CT: 1. Negative for intracranial hemorrhage or visible contusion. 2. Small volume pneumocephalus. Cervical spine CT: No evidence of injury. Electronically Signed   By: Marnee Spring M.D.   On: 11/29/2017 12:39   Ct Chest W Contrast  Result Date: 11/29/2017 CLINICAL DATA:  Hit by tree, blunt  trauma EXAM: CT CHEST, ABDOMEN, AND PELVIS WITH CONTRAST TECHNIQUE: Multidetector CT imaging of the chest, abdomen and pelvis was performed following the standard protocol during bolus administration of intravenous contrast. CONTRAST:  OMNIPAQUE IOHEXOL 300 MG/ML  SOLN COMPARISON:  None. FINDINGS: CT CHEST FINDINGS Cardiovascular: Heart size normal. No pericardial effusion. No thoracic aortic aneurysm. Mediastinum/Nodes: No mediastinal hematoma. No hilar or mediastinal adenopathy. Endotracheal tube projects in expected location. Lungs/Pleura: No pneumothorax. No pleural effusion. Advanced bullous emphysema most marked in the apices. Consolidation/atelectasis posteriorly in both lungs. Musculoskeletal: Minimally displaced fracture, posterior aspect left eleventh rib. CT ABDOMEN PELVIS FINDINGS Hepatobiliary: 4.7 cm linear laceration in hepatic segment 7, extending to the capsule medially just posterior to the IVC. No definite active extravasation. Gallbladder unremarkable. Pancreas: Unremarkable. No pancreatic ductal dilatation or surrounding inflammatory changes. Spleen: 4.5 cm laceration in the cephalad aspect of the spine bleeding extending towards the posterior capsule without definite capsular injury. There is no perisplenic hematoma. No active extravasation evident. Adrenals/Urinary Tract: 2.8 cm right adrenal hemorrhage extending into the posterior retroperitoneum with a small amount of extravasation evident on delayed scan. Left adrenal unremarkable. Kidneys unremarkable. No hydronephrosis. Urinary bladder nondistended. Stomach/Bowel: Physiologic distention of stomach with fluid and gas. The small bowel is nondilated. Normal appendix. Colon decompressed, unremarkable. Vascular/Lymphatic: No significant vascular findings are present. No enlarged abdominal or pelvic lymph nodes. Reproductive: Prostate is unremarkable. Other: No ascites.  No free air. Musculoskeletal: Lumbar spine intact. Bony pelvis  intact. Bilateral hips unremarkable. Comminuted sub trochanteric fracture of the left femoral shaft, incompletely visualized. IMPRESSION: 1. 4.7 cm linear laceration hepatic segment 7 without extracapsular hematoma or active  extravasation. 2. 2.8 cm right adrenal hemorrhage with some extravasation evident on delayed scan. 3. 4.5 cm splenic laceration without extracapsular hematoma or active extravasation. 4. Minimally displaced left eleventh rib fracture; no pneumothorax. 5. Comminuted proximal left femoral shaft subtrochanteric fracture, incompletely visualized. 6. Endotracheal tube in good position. 7. Advanced pulmonary bullous emphysema. Critical Value/emergent results were reviewed in person at the time of interpretation on 11/29/2017 at 12:33 pm with Dr. Janee Morn , who verbally acknowledged these results. Electronically Signed   By: Corlis Leak M.D.   On: 11/29/2017 12:34   Ct Cervical Spine Wo Contrast  Result Date: 11/29/2017 CLINICAL DATA:  Trauma with facial fracture suspected EXAM: CT HEAD WITHOUT CONTRAST CT MAXILLOFACIAL WITHOUT CONTRAST CT CERVICAL SPINE WITHOUT CONTRAST TECHNIQUE: Multidetector CT imaging of the head, cervical spine, and maxillofacial structures were performed using the standard protocol without intravenous contrast. Multiplanar CT image reconstructions of the cervical spine and maxillofacial structures were also generated. COMPARISON:  None. FINDINGS: CT HEAD FINDINGS Brain: No evidence of acute infarction, hemorrhage, hydrocephalus, extra-axial collection or mass lesion/mass effect. Vascular: Negative Skull: Facial fractures and skull base fractures described below. CT MAXILLOFACIAL FINDINGS Osseous: Nondisplaced right anterior mandible fracture extending between the canine and first premolar roots. No additional mandibular fracture is seen. No mandibular dislocation. Tripod fracture on the right with anterior and posterior walls right maxillary sinus, right orbital floor,  lateral right orbit wall. The fracture complexes incomplete at the level of the zygomatic arch. There is mild depression of the right zygoma compared to the left. Right temporal bone fracture with hemotympanum. Fracture appears to spare the otic capsule. There is disruption of the malleoincudal joint. Central skull base fracture traversing the pterygoid bodies and sphenoid sinuses. Posterior nasal septum shows segmental fracturing. Best seen on coronal reformats there is fracture offset along the right foramen rotundum. No carotid canal involvement. Right orbital roof fracture.  Small volume pneumocephalus. Orbits: Soft tissue gas from sinus fracturing on the right where there is also mild extraconal stranding. No hematoma or worrisome proptosis. Sinuses: Patchy hemosinus. Soft tissues: Facial contusion.  No opaque foreign body. CT CERVICAL SPINE FINDINGS Alignment: No traumatic malalignment Skull base and vertebrae: Negative for fracture Soft tissues and spinal canal: No prevertebral fluid or swelling. No visible canal hematoma. Disc levels:  Negative Upper chest: Reported separately. IMPRESSION: Maxillofacial CT: 1. Nondisplaced right anterior mandible fracture. 2. Tripod fracture on the right that is incomplete at the zygomatic arch. 3. Central skull base fracturing involving the sphenoid sinuses. This fracture is displaced at the right foramen rotundum. 4. Right temporal bone fracture with malleo-incudal subluxation. Head CT: 1. Negative for intracranial hemorrhage or visible contusion. 2. Small volume pneumocephalus. Cervical spine CT: No evidence of injury. Electronically Signed   By: Marnee Spring M.D.   On: 11/29/2017 12:39   Ct Abdomen Pelvis W Contrast  Result Date: 11/29/2017 CLINICAL DATA:  Hit by tree, blunt trauma EXAM: CT CHEST, ABDOMEN, AND PELVIS WITH CONTRAST TECHNIQUE: Multidetector CT imaging of the chest, abdomen and pelvis was performed following the standard protocol during bolus  administration of intravenous contrast. CONTRAST:  OMNIPAQUE IOHEXOL 300 MG/ML  SOLN COMPARISON:  None. FINDINGS: CT CHEST FINDINGS Cardiovascular: Heart size normal. No pericardial effusion. No thoracic aortic aneurysm. Mediastinum/Nodes: No mediastinal hematoma. No hilar or mediastinal adenopathy. Endotracheal tube projects in expected location. Lungs/Pleura: No pneumothorax. No pleural effusion. Advanced bullous emphysema most marked in the apices. Consolidation/atelectasis posteriorly in both lungs. Musculoskeletal: Minimally displaced fracture, posterior aspect left  eleventh rib. CT ABDOMEN PELVIS FINDINGS Hepatobiliary: 4.7 cm linear laceration in hepatic segment 7, extending to the capsule medially just posterior to the IVC. No definite active extravasation. Gallbladder unremarkable. Pancreas: Unremarkable. No pancreatic ductal dilatation or surrounding inflammatory changes. Spleen: 4.5 cm laceration in the cephalad aspect of the spine bleeding extending towards the posterior capsule without definite capsular injury. There is no perisplenic hematoma. No active extravasation evident. Adrenals/Urinary Tract: 2.8 cm right adrenal hemorrhage extending into the posterior retroperitoneum with a small amount of extravasation evident on delayed scan. Left adrenal unremarkable. Kidneys unremarkable. No hydronephrosis. Urinary bladder nondistended. Stomach/Bowel: Physiologic distention of stomach with fluid and gas. The small bowel is nondilated. Normal appendix. Colon decompressed, unremarkable. Vascular/Lymphatic: No significant vascular findings are present. No enlarged abdominal or pelvic lymph nodes. Reproductive: Prostate is unremarkable. Other: No ascites.  No free air. Musculoskeletal: Lumbar spine intact. Bony pelvis intact. Bilateral hips unremarkable. Comminuted sub trochanteric fracture of the left femoral shaft, incompletely visualized. IMPRESSION: 1. 4.7 cm linear laceration hepatic segment 7  without extracapsular hematoma or active extravasation. 2. 2.8 cm right adrenal hemorrhage with some extravasation evident on delayed scan. 3. 4.5 cm splenic laceration without extracapsular hematoma or active extravasation. 4. Minimally displaced left eleventh rib fracture; no pneumothorax. 5. Comminuted proximal left femoral shaft subtrochanteric fracture, incompletely visualized. 6. Endotracheal tube in good position. 7. Advanced pulmonary bullous emphysema. Critical Value/emergent results were reviewed in person at the time of interpretation on 11/29/2017 at 12:33 pm with Dr. Janee Morn , who verbally acknowledged these results. Electronically Signed   By: Corlis Leak M.D.   On: 11/29/2017 12:34   Dg Chest Port 1 View  Result Date: 12/01/2017 CLINICAL DATA:  Endotracheal tube position EXAM: PORTABLE CHEST 1 VIEW COMPARISON:  11/30/2017 FINDINGS: Endotracheal tube 6 cm above the carina unchanged. Central line in the proximal SVC unchanged. NG tube in the stomach with side hole in the distal esophagus. Right upper lobe emphysema. Progression of bibasilar airspace disease and small effusions IMPRESSION: Progression of bibasilar atelectasis/pneumonia and small effusions. NG tip in the stomach with the side hole distal esophagus. Recommend advancing NG tube Electronically Signed   By: Marlan Palau M.D.   On: 12/01/2017 09:30   Dg Chest Port 1 View  Result Date: 11/30/2017 CLINICAL DATA:  Left rib fracture. EXAM: PORTABLE CHEST 1 VIEW COMPARISON:  Radiographs and CT scan of November 29, 2017. FINDINGS: The heart size and mediastinal contours are within normal limits. Endotracheal tube is noted with distal tip 7 cm above the carina. Nasogastric tube is seen in expected position of esophagus with side hole above gastroesophageal junction. Severe emphysematous disease is noted in both upper lobes, right greater than left. Interval placement of right internal jugular catheter with distal tip in expected position  of the SVC. Mild bibasilar subsegmental atelectasis is noted with probable small pleural effusions. The visualized skeletal structures are unremarkable. IMPRESSION: Endotracheal tube in grossly good position. Interval placement of nasogastric tube with side hole above gastroesophageal junction. Right internal jugular catheter in grossly good position. Interval development of bibasilar subsegmental atelectasis or small pleural effusions. Severe emphysematous disease is noted in both upper lobes. Electronically Signed   By: Lupita Raider, M.D.   On: 11/30/2017 10:11   Dg Tibia/fibula Left Port  Result Date: 11/29/2017 CLINICAL DATA:  Lower leg fracture EXAM: PORTABLE LEFT TIBIA AND FIBULA - 2 VIEW COMPARISON:  11/29/2017 FINDINGS: Acute comminuted fracture involving the midshaft of the fibula with close to 1/2 bone  with of medial and less than 1/4 bone with of posterior displacement of main fracture fragment. Several small displaced fragments are noted at the site of fracture. Soft tissue swelling is evident. IMPRESSION: Acute comminuted, mildly displaced fracture involving the midshaft of the fibula Electronically Signed   By: Jasmine Pang M.D.   On: 11/29/2017 19:25   Dg Tibia/fibula Right Port  Result Date: 11/29/2017 CLINICAL DATA:  Lower leg fracture EXAM: PORTABLE RIGHT TIBIA AND FIBULA - 2 VIEW COMPARISON:  11/29/2017 FINDINGS: Acute markedly comminuted fracture involving the midshaft of the fibula with multiple displaced bone fragments at the fracture site. Interim placement of a drain at the fracture. Decreased soft tissue gas compared to prior. IMPRESSION: Acute comminuted and displaced fracture involving the mid fibular shaft with interim placement of a drain and interval decrease in soft tissue emphysema. Electronically Signed   By: Jasmine Pang M.D.   On: 11/29/2017 19:23   Dg C-arm 1-60 Min  Result Date: 11/29/2017 CLINICAL DATA:  ORIF. EXAM: LEFT FEMUR 2 VIEWS; DG C-ARM 61-120 MIN  COMPARISON:  Same day FINDINGS: Multiple C-arm images show an open comminuted fracture of the mid shaft of the fibula on the right. Multiple images on the left show a mildly comminuted an apparently nondisplaced fracture of the mid shaft of the fibula. Subsequent images show gamma nail fixation of a comminuted femur fracture on the left with distal locking screws. IMPRESSION: Multiple lower extremity C-arm images as above. Electronically Signed   By: Paulina Fusi M.D.   On: 11/29/2017 16:45   Dg C-arm 1-60 Min  Result Date: 11/29/2017 CLINICAL DATA:  Irrigation of right lower extremity wound for open tibial fracture. EXAM: RIGHT TIBIA AND FIBULA - 2 VIEW; DG C-ARM 61-120 MIN; LEFT TIBIA AND FIBULA - 2 VIEW FLUOROSCOPY TIME:  21 seconds. COMPARISON:  Radiographs of same day. FINDINGS: Four intraoperative fluoroscopic images were obtained of the right lower leg. These demonstrate severely comminuted and displaced fracture involving the midshaft of the right tibia with overlying soft tissue laceration or wound. Three intraoperative fluoroscopic images of the left tibia and fibula demonstrate mildly displaced and comminuted left fibular shaft fracture. IMPRESSION: Fluoroscopic guidance and evaluation was performed of bilateral fibular fractures. Electronically Signed   By: Lupita Raider, M.D.   On: 11/29/2017 16:21   Dg Femur Min 2 Views Left  Result Date: 11/29/2017 CLINICAL DATA:  ORIF. EXAM: LEFT FEMUR 2 VIEWS; DG C-ARM 61-120 MIN COMPARISON:  Same day FINDINGS: Multiple C-arm images show an open comminuted fracture of the mid shaft of the fibula on the right. Multiple images on the left show a mildly comminuted an apparently nondisplaced fracture of the mid shaft of the fibula. Subsequent images show gamma nail fixation of a comminuted femur fracture on the left with distal locking screws. IMPRESSION: Multiple lower extremity C-arm images as above. Electronically Signed   By: Paulina Fusi M.D.   On:  11/29/2017 16:45   Dg Femur Port Min 2 Views Left  Result Date: 11/29/2017 CLINICAL DATA:  Postop EXAM: LEFT FEMUR PORTABLE 2 VIEWS COMPARISON:  11/29/2017 FINDINGS: Interval intramedullary rod and screw fixation of the left femur for comminuted fracture involving the proximal shaft of the femur with decreased displacement of fracture segments. Hardware appears intact. Small amount of gas in the distal soft tissues. IMPRESSION: Interval intramedullary rod and screw fixation of the comminuted proximal femoral fracture with interval decreased displacement of fracture fragments. Electronically Signed   By: Adrian Prows.D.  On: 11/29/2017 19:28   Ct Maxillofacial Wo Contrast  Result Date: 11/29/2017 CLINICAL DATA:  Trauma with facial fracture suspected EXAM: CT HEAD WITHOUT CONTRAST CT MAXILLOFACIAL WITHOUT CONTRAST CT CERVICAL SPINE WITHOUT CONTRAST TECHNIQUE: Multidetector CT imaging of the head, cervical spine, and maxillofacial structures were performed using the standard protocol without intravenous contrast. Multiplanar CT image reconstructions of the cervical spine and maxillofacial structures were also generated. COMPARISON:  None. FINDINGS: CT HEAD FINDINGS Brain: No evidence of acute infarction, hemorrhage, hydrocephalus, extra-axial collection or mass lesion/mass effect. Vascular: Negative Skull: Facial fractures and skull base fractures described below. CT MAXILLOFACIAL FINDINGS Osseous: Nondisplaced right anterior mandible fracture extending between the canine and first premolar roots. No additional mandibular fracture is seen. No mandibular dislocation. Tripod fracture on the right with anterior and posterior walls right maxillary sinus, right orbital floor, lateral right orbit wall. The fracture complexes incomplete at the level of the zygomatic arch. There is mild depression of the right zygoma compared to the left. Right temporal bone fracture with hemotympanum. Fracture appears to spare  the otic capsule. There is disruption of the malleoincudal joint. Central skull base fracture traversing the pterygoid bodies and sphenoid sinuses. Posterior nasal septum shows segmental fracturing. Best seen on coronal reformats there is fracture offset along the right foramen rotundum. No carotid canal involvement. Right orbital roof fracture.  Small volume pneumocephalus. Orbits: Soft tissue gas from sinus fracturing on the right where there is also mild extraconal stranding. No hematoma or worrisome proptosis. Sinuses: Patchy hemosinus. Soft tissues: Facial contusion.  No opaque foreign body. CT CERVICAL SPINE FINDINGS Alignment: No traumatic malalignment Skull base and vertebrae: Negative for fracture Soft tissues and spinal canal: No prevertebral fluid or swelling. No visible canal hematoma. Disc levels:  Negative Upper chest: Reported separately. IMPRESSION: Maxillofacial CT: 1. Nondisplaced right anterior mandible fracture. 2. Tripod fracture on the right that is incomplete at the zygomatic arch. 3. Central skull base fracturing involving the sphenoid sinuses. This fracture is displaced at the right foramen rotundum. 4. Right temporal bone fracture with malleo-incudal subluxation. Head CT: 1. Negative for intracranial hemorrhage or visible contusion. 2. Small volume pneumocephalus. Cervical spine CT: No evidence of injury. Electronically Signed   By: Marnee Spring M.D.   On: 11/29/2017 12:39    Anti-infectives: Anti-infectives (From admission, onward)   Start     Dose/Rate Route Frequency Ordered Stop   12/01/17 1032  tobramycin (NEBCIN) powder  Status:  Discontinued       As needed 12/01/17 1032 12/01/17 1105   12/01/17 1031  vancomycin (VANCOCIN) powder  Status:  Discontinued       As needed 12/01/17 1031 12/01/17 1105   11/29/17 2000  ceFAZolin (ANCEF) IVPB 2g/100 mL premix     2 g 200 mL/hr over 30 Minutes Intravenous Every 8 hours 11/29/17 1617 12/01/17 1959   11/29/17 1334  vancomycin  (VANCOCIN) powder  Status:  Discontinued       As needed 11/29/17 1335 11/29/17 1547   11/29/17 1327  tobramycin (NEBCIN) powder  Status:  Discontinued       As needed 11/29/17 1334 11/29/17 1547      Assessment/Plan: s/p Procedure(s): OPEN REDUCTION INTERNAL FIXATION (ORIF) MANDIBULAR FRACTURE TRACHEOSTOMY IRRIGATION AND DEBRIDEMENT RIGHT LOWER EXTREMITY AND VAC CHANGE OR today and possible wean with trach tomorrow.  LOS: 2 days   Marta Lamas. Gae Bon, MD, FACS [phone] Trauma Surgeon 12/01/2017

## 2017-12-01 NOTE — Transfer of Care (Signed)
Immediate Anesthesia Transfer of Care Note  Patient: Willie Blevins  Procedure(s) Performed: OPEN REDUCTION INTERNAL FIXATION (ORIF) MANDIBULAR FRACTURE (N/A Face) TRACHEOSTOMY (N/A Neck) IRRIGATION AND DEBRIDEMENT RIGHT LOWER EXTREMITY AND VAC CHANGE (Right )  Patient Location: ICU  Anesthesia Type:General  Level of Consciousness: Patient remains intubated per anesthesia plan    Airway & Oxygen Therapy: Patient remains intubated per anesthesia plan  Post-op Assessment: Report given to RN and Post -op Vital signs reviewed and stable  Post vital signs: Reviewed and stable  Last Vitals:  Vitals Value Taken Time  BP    Temp    Pulse    Resp    SpO2      Last Pain:  Vitals:   12/01/17 0400  TempSrc: Axillary  PainSc:          Complications: No apparent anesthesia complications
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
## 2017-12-02 ENCOUNTER — Inpatient Hospital Stay (HOSPITAL_COMMUNITY): Payer: Worker's Compensation

## 2017-12-02 ENCOUNTER — Encounter (HOSPITAL_COMMUNITY): Payer: Self-pay | Admitting: Orthopedic Surgery

## 2017-12-02 DIAGNOSIS — S82401B Unspecified fracture of shaft of right fibula, initial encounter for open fracture type I or II: Secondary | ICD-10-CM

## 2017-12-02 DIAGNOSIS — S8780XA Crushing injury of unspecified lower leg, initial encounter: Secondary | ICD-10-CM | POA: Diagnosis present

## 2017-12-02 HISTORY — DX: Unspecified fracture of shaft of right fibula, initial encounter for open fracture type I or II: S82.401B

## 2017-12-02 LAB — RENAL FUNCTION PANEL
Albumin: 2.3 g/dL — ABNORMAL LOW (ref 3.5–5.0)
Anion gap: 9 (ref 5–15)
BUN: 23 mg/dL — AB (ref 6–20)
CALCIUM: 8.3 mg/dL — AB (ref 8.9–10.3)
CO2: 25 mmol/L (ref 22–32)
CREATININE: 1.33 mg/dL — AB (ref 0.61–1.24)
Chloride: 104 mmol/L (ref 98–111)
GFR calc Af Amer: >60 mL/min (ref >60)
Glucose, Bld: 90 mg/dL (ref 70–99)
Phosphorus: 4.1 mg/dL (ref 2.5–4.6)
Potassium: 3.4 mmol/L — ABNORMAL LOW (ref 3.5–5.1)
SODIUM: 138 mmol/L (ref 135–145)

## 2017-12-02 LAB — CBC
HCT: 28.7 % — ABNORMAL LOW (ref 39.0–52.0)
Hemoglobin: 9.1 g/dL — ABNORMAL LOW (ref 13.0–17.0)
MCH: 29.1 pg (ref 26.0–34.0)
MCHC: 31.7 g/dL (ref 30.0–36.0)
MCV: 91.7 fL (ref 80.0–100.0)
NRBC: 0 % (ref 0.0–0.2)
PLATELETS: 204 10*3/uL (ref 150–400)
RBC: 3.13 MIL/uL — AB (ref 4.22–5.81)
RDW: 13.4 % (ref 11.5–15.5)
WBC: 10.3 10*3/uL (ref 4.0–10.5)

## 2017-12-02 LAB — GLUCOSE, CAPILLARY
GLUCOSE-CAPILLARY: 121 mg/dL — AB (ref 70–99)
GLUCOSE-CAPILLARY: 114 mg/dL — AB (ref 70–99)
Glucose-Capillary: 109 mg/dL — ABNORMAL HIGH (ref 70–99)

## 2017-12-02 LAB — CK: CK TOTAL: 23 U/L — AB (ref 49–397)

## 2017-12-02 MED ORDER — ACETAMINOPHEN 160 MG/5ML PO SOLN
650.0000 mg | Freq: Four times a day (QID) | ORAL | Status: DC
  Administered 2017-12-02 – 2017-12-07 (×12): 650 mg
  Filled 2017-12-02 (×12): qty 20.3

## 2017-12-02 MED ORDER — PIVOT 1.5 CAL PO LIQD
1000.0000 mL | ORAL | Status: DC
  Administered 2017-12-02 – 2017-12-08 (×4): 1000 mL

## 2017-12-02 MED ORDER — POLYVINYL ALCOHOL 1.4 % OP SOLN
2.0000 [drp] | OPHTHALMIC | Status: DC
  Administered 2017-12-02 – 2017-12-10 (×46): 2 [drp] via OPHTHALMIC
  Filled 2017-12-02 (×2): qty 15

## 2017-12-02 MED ORDER — POTASSIUM CHLORIDE 10 MEQ/100ML IV SOLN
10.0000 meq | INTRAVENOUS | Status: AC
  Administered 2017-12-02 (×2): 10 meq via INTRAVENOUS
  Filled 2017-12-02 (×2): qty 100

## 2017-12-02 MED ORDER — ORAL CARE MOUTH RINSE
15.0000 mL | OROMUCOSAL | Status: DC
  Administered 2017-12-03 – 2017-12-13 (×47): 15 mL via OROMUCOSAL

## 2017-12-02 MED ORDER — VITAMIN C 500 MG/5ML PO SYRP
1000.0000 mg | ORAL_SOLUTION | Freq: Every day | ORAL | Status: DC
  Administered 2017-12-02 – 2017-12-13 (×7): 1000 mg
  Filled 2017-12-02 (×12): qty 10

## 2017-12-02 MED ORDER — GABAPENTIN 250 MG/5ML PO SOLN
300.0000 mg | Freq: Three times a day (TID) | ORAL | Status: DC
  Administered 2017-12-02 – 2017-12-08 (×12): 300 mg
  Filled 2017-12-02 (×22): qty 6

## 2017-12-02 MED ORDER — METHOCARBAMOL 1000 MG/10ML IJ SOLN
1000.0000 mg | Freq: Three times a day (TID) | INTRAVENOUS | Status: DC | PRN
  Administered 2017-12-04 – 2017-12-05 (×3): 1000 mg via INTRAVENOUS
  Filled 2017-12-02 (×4): qty 10

## 2017-12-02 MED ORDER — CIPROFLOXACIN-DEXAMETHASONE 0.3-0.1 % OT SUSP
3.0000 [drp] | Freq: Three times a day (TID) | OTIC | Status: DC
  Administered 2017-12-02 – 2017-12-13 (×31): 3 [drp] via OTIC
  Filled 2017-12-02 (×2): qty 7.5

## 2017-12-02 MED ORDER — HYDROMORPHONE HCL 1 MG/ML IJ SOLN
0.5000 mg | INTRAMUSCULAR | Status: DC | PRN
  Administered 2017-12-03 (×2): 1 mg via INTRAVENOUS
  Administered 2017-12-03: 0.5 mg via INTRAVENOUS
  Administered 2017-12-03 (×2): 1 mg via INTRAVENOUS
  Administered 2017-12-03: 0.5 mg via INTRAVENOUS
  Administered 2017-12-03 – 2017-12-06 (×15): 1 mg via INTRAVENOUS
  Administered 2017-12-06: 0.5 mg via INTRAVENOUS
  Administered 2017-12-07: 1 mg via INTRAVENOUS
  Administered 2017-12-07: 0.5 mg via INTRAVENOUS
  Administered 2017-12-07 (×2): 1 mg via INTRAVENOUS
  Administered 2017-12-08: 0.5 mg via INTRAVENOUS
  Administered 2017-12-09 – 2017-12-13 (×17): 1 mg via INTRAVENOUS
  Filled 2017-12-02 (×45): qty 1

## 2017-12-02 MED ORDER — OXYCODONE HCL 5 MG/5ML PO SOLN
5.0000 mg | ORAL | Status: DC | PRN
  Administered 2017-12-04: 10 mg
  Administered 2017-12-04: 5 mg
  Administered 2017-12-04 (×2): 10 mg
  Filled 2017-12-02: qty 5
  Filled 2017-12-02 (×3): qty 10

## 2017-12-02 MED ORDER — ARTIFICIAL TEARS OPHTHALMIC OINT
TOPICAL_OINTMENT | Freq: Every evening | OPHTHALMIC | Status: DC | PRN
  Administered 2017-12-02 – 2017-12-04 (×6): 1 via OPHTHALMIC
  Administered 2017-12-05 – 2017-12-07 (×7): via OPHTHALMIC
  Administered 2017-12-08 – 2017-12-09 (×4): 1 via OPHTHALMIC
  Administered 2017-12-10 – 2017-12-13 (×3): via OPHTHALMIC
  Filled 2017-12-02: qty 3.5
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
## 2017-12-02 NOTE — Progress Notes (Signed)
1 Day Post-Op   Subjective/Chief Complaint: He is felling well. No significant pain of the mandible. He feels like his teeth are in proper alignment. He is breathing well and off vent.    Objective: Vital signs in last 24 hours: Temp:  [99.1 F (37.3 C)-100 F (37.8 C)] 99.1 F (37.3 C) (10/18 0825) Pulse Rate:  [80-117] 100 (10/18 1110) Resp:  [8-21] 12 (10/18 1110) BP: (105-136)/(55-81) 114/73 (10/18 1110) SpO2:  [90 %-96 %] 94 % (10/18 1110) Arterial Line BP: (111-159)/(56-82) 126/56 (10/18 0800) FiO2 (%):  [40 %-60 %] 60 % (10/18 1110) Last BM Date: (pta)  Intake/Output from previous day: 10/17 0701 - 10/18 0700 In: 4295.7 [I.V.:4195.7; IV Piggyback:100] Out: 5345 [Urine:5325; Blood:20] Intake/Output this shift: Total I/O In: 797.4 [I.V.:647.8; IV Piggyback:149.6] Out: 450 [Urine:450]  minimal swelliing of the mandible area. he has feeling of both sides of the lips. His facial swelling is decreased. His vision is good. The trach ios open and on trach collar. Facial nerve is out in the frontal, buccal and marginal. His eye still colses and it does not appear to have any irritation  Lab Results:  Recent Labs    12/01/17 0345 12/02/17 0511  WBC 9.6 10.3  HGB 9.3* 9.1*  HCT 29.3* 28.7*  PLT 161 204   BMET Recent Labs    12/01/17 0345 12/02/17 0511  NA 132* 138  K 3.6 3.4*  CL 102 104  CO2 24 25  GLUCOSE 101* 90  BUN 7 23*  CREATININE 0.81 1.33*  CALCIUM 7.2* 8.3*   PT/INR No results for input(s): LABPROT, INR in the last 72 hours. ABG Recent Labs    11/29/17 1442 11/30/17 0136  PHART 7.339* 7.329*  HCO3 21.6 20.9    Studies/Results: Dg Chest Port 1 View  Result Date: 12/01/2017 CLINICAL DATA:  Endotracheal tube position EXAM: PORTABLE CHEST 1 VIEW COMPARISON:  11/30/2017 FINDINGS: Endotracheal tube 6 cm above the carina unchanged. Central line in the proximal SVC unchanged. NG tube in the stomach with side hole in the distal esophagus. Right upper  lobe emphysema. Progression of bibasilar airspace disease and small effusions IMPRESSION: Progression of bibasilar atelectasis/pneumonia and small effusions. NG tip in the stomach with the side hole distal esophagus. Recommend advancing NG tube Electronically Signed   By: Marlan Palau M.D.   On: 12/01/2017 09:30   Dg Abd Portable 1v  Result Date: 12/02/2017 CLINICAL DATA:  Feeding tube placement EXAM: PORTABLE ABDOMEN - 1 VIEW COMPARISON:  the previous day's study FINDINGS: Weighted tip feeding tube has been advanced into the gastric body. Normal bowel gas pattern. Visualized bones unremarkable. IMPRESSION: Feeding tube to the gastric body. Electronically Signed   By: Corlis Leak M.D.   On: 12/02/2017 11:05   Ct Temporal Bones Wo Contrast  Result Date: 12/02/2017 CLINICAL DATA:  42 y/o M; right orbit, maxillary, and mastoid fractures. EXAM: CT TEMPORAL BONES WITHOUT CONTRAST TECHNIQUE: Axial and coronal plane CT imaging of the petrous temporal bones was performed with thin-collimation image reconstruction. No intravenous contrast was administered. Multiplanar CT image reconstructions were also generated. COMPARISON:  11/29/2017 CT maxillofacial. FINDINGS: Right ear: Acute longitudinal fracture of the right temporal bone traversing mastoid air cells (series 3, image 60), tegmen mastoideum (series 4, image 159), tegmen tympani (series 4, image 153), posteromedial wall of glenoid fossa (series 4, image 135 and series 3, image 46), lateral wall of carotid canal (series 4, image 146), and contiguous with right zygomatic complex and pterygoid plate fractures.  Normal malleus incus articulation. Incudostapedial joint dislocation with the tip of the long process of the incus superiorly directed through the tegmen tympani fracture into the intracranial compartment (series 7, image 167). The manubrium of the malleolus extends to its expected location at the tympanic membrane (series 7, image 164). The stapes is  probably orthotopic but incompletely visualized (series 7, image 166). Opacification of the right mastoid air cells and the right middle ear cavity with fluid levels. Inner ear structures are intact. Left ear: Normal external auditory canal. Normal tympanic membrane. No blunting of the scutum. The ossicles are intact without gross erosion. Normal course of the facial nerve. Normal semicircular canals and vestibulocochlear apparatus. Trace opacification of left mastoid air cells. Other: Acute right zygomatic complex fracture involving anterior posterior walls of the maxillary sinus, floor right orbit, with propagation into the pterygoid plate. The pterygoid plate fracture is contiguous with the longitudinal fracture of the right temporal bone. Nondisplaced fracture of the right greater sphenoid wing. Blood fluid levels in the sinuses. IMPRESSION: Right ear: 1. Acute longitudinal fracture of the right temporal bone traversing mastoid air cells, tegmen mastoideum, tegmen tympani, posteromedial wall of glenoid fossa, and lateral wall of the carotid canal. The fracture is contiguous with skull base, right zygomatic complex, and right pterygoid plate fractures of the face. 2. Incudostapedial joint dislocation with the tip of the long process of the incus superiorly directed through the tegmen tympani fracture into the intracranial compartment. 3. Stapes is probably orthotopic but incompletely visualized. 4. Malleus appears orthotopic. 5. Inner ear structures are intact. Left ear: Trace mastoid opacification. No fracture or ossicular dislocation identified. Electronically Signed   By: Mitzi Hansen M.D.   On: 12/02/2017 05:18    Anti-infectives: Anti-infectives (From admission, onward)   Start     Dose/Rate Route Frequency Ordered Stop   12/01/17 1032  tobramycin (NEBCIN) powder  Status:  Discontinued       As needed 12/01/17 1032 12/01/17 1105   12/01/17 1031  vancomycin (VANCOCIN) powder  Status:   Discontinued       As needed 12/01/17 1031 12/01/17 1105   11/29/17 2000  ceFAZolin (ANCEF) IVPB 2g/100 mL premix     2 g 200 mL/hr over 30 Minutes Intravenous Every 8 hours 11/29/17 1617 12/01/17 1326   11/29/17 1334  vancomycin (VANCOCIN) powder  Status:  Discontinued       As needed 11/29/17 1335 11/29/17 1547   11/29/17 1327  tobramycin (NEBCIN) powder  Status:  Discontinued       As needed 11/29/17 1334 11/29/17 1547      Assessment/Plan: s/p Procedure(s): OPEN REDUCTION INTERNAL FIXATION (ORIF) MANDIBULAR FRACTURE (N/A) TRACHEOSTOMY (N/A) IRRIGATION AND DEBRIDEMENT RIGHT LOWER EXTREMITY AND VAC CHANGE (Right) He definitely has facial paralysis on the right. This is the first day to fully access this. He had temporal bone CT scan with long. fx with sever injury to the ossicular chain. Dr Dorma Russell has agreed to see patient regarding the ear injury. The cuff of the trach can be deflated. Will change to a cuffless on Monday.   LOS: 3 days    Suzanna Obey 12/02/2017

## 2017-12-02 NOTE — Procedures (Signed)
Cortrak  Person Inserting Tube:  Bralon Antkowiak, RD Tube Type:  Cortrak - 43 inches Tube Location:  Left nare Initial Placement:  Stomach Secured by: Tape Technique Used to Measure Tube Placement:  Documented cm marking at nare/ corner of mouth Cortrak Secured At:  56 cm Procedure Comments:     X-ray is required, abdominal x-ray has been ordered by the Cortrak team. Please confirm tube placement before using the Cortrak tube.   If the tube becomes dislodged please keep the tube and contact the Cortrak team at [email or web address] (password [email or web address]) for replacement.  If after hours and replacement cannot be delayed, place a NG tube and confirm placement with an abdominal x-ray.    Vanessa Kick RD, LDN Clinical Nutrition Pager # [phone]

## 2017-12-02 NOTE — Progress Notes (Signed)
150 cc IV Fentanyl wasted in narcotic waste container as witnessed by Everette Rank, RN.

## 2017-12-02 NOTE — Progress Notes (Signed)
Chaplain saw pt's wife in waiting area and provided continued support.  Chaplain and pt's wife met first when pt arrived at Emergency Dept. Escorted him to surgical waiting. Pt's wife was feeling grateful for the wonderful care pt was receiving and encouraging husband to be patient in his progress. 19 year wedding anniversary upcoming is a goal for him. Wife said he had FaceTimed with his children. Will continue to offer support to pt and family. Tamsen Snider [phone] pager

## 2017-12-02 NOTE — Progress Notes (Signed)
Met at bedside with pt's wife, father, and [facility] Case Manager Amy Pearson.  Introduced myself and explained Inpatient Case Manager role in relationship with WC Case Freight forwarder.  Currently PT/OT evals are pending; anticipate possible need for inpatient rehab prior to returning home, and this was discussed with pt/family.  Family agreeable to sharing of information with [facility] Case Manager.  Clinical information faxed to Ms. Pearson at [phone].   Cell:  [phone] Office:  [phone]  Reinaldo Raddle, RN, BSN  Trauma/Neuro ICU Case Manager [phone]

## 2017-12-02 NOTE — Progress Notes (Signed)
Pt transported from 4N30 to CT and back with no complications.  
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
## 2017-12-03 ENCOUNTER — Inpatient Hospital Stay (HOSPITAL_COMMUNITY): Payer: Worker's Compensation

## 2017-12-03 LAB — BPAM RBC
BLOOD PRODUCT EXPIRATION DATE: 201911022359
Blood Product Expiration Date: 201911032359
Blood Product Expiration Date: 201911102359
Blood Product Expiration Date: 201911112359
ISSUE DATE / TIME: 201910151510
ISSUE DATE / TIME: 201910151510
ISSUE DATE / TIME: 201910152112
ISSUE DATE / TIME: 201910160057
UNIT TYPE AND RH: 1700
Unit Type and Rh: 1700
Unit Type and Rh: 9500
Unit Type and Rh: 9500

## 2017-12-03 LAB — TYPE AND SCREEN
ABO/RH(D): B NEG
ANTIBODY SCREEN: NEGATIVE
UNIT DIVISION: 0
UNIT DIVISION: 0
Unit division: 0
Unit division: 0

## 2017-12-03 LAB — URINALYSIS, COMPLETE (UACMP) WITH MICROSCOPIC
BILIRUBIN URINE: NEGATIVE
Glucose, UA: NEGATIVE mg/dL
HGB URINE DIPSTICK: NEGATIVE
KETONES UR: NEGATIVE mg/dL
Leukocytes, UA: NEGATIVE
Nitrite: NEGATIVE
PROTEIN: NEGATIVE mg/dL
Specific Gravity, Urine: 1.019 (ref 1.005–1.030)
pH: 7 (ref 5.0–8.0)

## 2017-12-03 LAB — BASIC METABOLIC PANEL
ANION GAP: 8 (ref 5–15)
BUN: 15 mg/dL (ref 6–20)
CALCIUM: 7.6 mg/dL — AB (ref 8.9–10.3)
CO2: 25 mmol/L (ref 22–32)
Chloride: 104 mmol/L (ref 98–111)
Creatinine, Ser: 0.72 mg/dL (ref 0.61–1.24)
GFR calc Af Amer: >60 mL/min (ref >60)
GFR calc non Af Amer: >60 mL/min (ref >60)
GLUCOSE: 128 mg/dL — AB (ref 70–99)
Potassium: 3.8 mmol/L (ref 3.5–5.1)
SODIUM: 137 mmol/L (ref 135–145)

## 2017-12-03 LAB — GLUCOSE, CAPILLARY
Glucose-Capillary: 96 mg/dL (ref 70–99)
Glucose-Capillary: 109 mg/dL — ABNORMAL HIGH (ref 70–99)
Glucose-Capillary: 103 mg/dL — ABNORMAL HIGH (ref 70–99)
Glucose-Capillary: 120 mg/dL — ABNORMAL HIGH (ref 70–99)
Glucose-Capillary: 107 mg/dL — ABNORMAL HIGH (ref 70–99)

## 2017-12-03 LAB — CBC
HCT: 29.6 % — ABNORMAL LOW (ref 39.0–52.0)
HEMOGLOBIN: 9.1 g/dL — AB (ref 13.0–17.0)
MCH: 28.4 pg (ref 26.0–34.0)
MCHC: 30.7 g/dL (ref 30.0–36.0)
MCV: 92.5 fL (ref 80.0–100.0)
PLATELETS: 259 10*3/uL (ref 150–400)
RBC: 3.2 MIL/uL — ABNORMAL LOW (ref 4.22–5.81)
RDW: 13.3 % (ref 11.5–15.5)
WBC: 12.1 10*3/uL — AB (ref 4.0–10.5)
nRBC: 0.7 % — ABNORMAL HIGH (ref 0.0–0.2)

## 2017-12-03 LAB — CK: CK TOTAL: 1963 U/L — AB (ref 49–397)

## 2017-12-03 MED ORDER — SODIUM CHLORIDE 0.9 % IV SOLN
2.0000 g | Freq: Two times a day (BID) | INTRAVENOUS | Status: DC
  Administered 2017-12-03 – 2017-12-04 (×4): 2 g via INTRAVENOUS
  Filled 2017-12-03 (×6): qty 2

## 2017-12-03 MED ORDER — VANCOMYCIN HCL IN DEXTROSE 1-5 GM/200ML-% IV SOLN
1000.0000 mg | Freq: Three times a day (TID) | INTRAVENOUS | Status: DC
  Administered 2017-12-03 – 2017-12-05 (×6): 1000 mg via INTRAVENOUS
  Filled 2017-12-03 (×8): qty 200

## 2017-12-03 MED ORDER — LORAZEPAM BOLUS VIA INFUSION: 0.5000 mg | Freq: Three times a day (TID) | INTRAVENOUS | Status: DC | PRN

## 2017-12-03 MED ORDER — LORAZEPAM 2 MG/ML IJ SOLN
0.5000 mg | Freq: Three times a day (TID) | INTRAMUSCULAR | Status: DC | PRN
  Administered 2017-12-03 – 2017-12-10 (×10): 1 mg via INTRAVENOUS
  Filled 2017-12-03 (×10): qty 1

## 2017-12-03 NOTE — Progress Notes (Signed)
RT at bedside, pt's SpO2 dropped in to the upper 80's on 60% ATC. Fio2 increased to 98% with no much improvement. Pt has equal BBS. Pt stated he is in pain and tired.  Pt placed back on full vent support to allow him to rest for the night. Current FiO2 on vent is 70%, SpO2 96%. Will wean fio2 as tolerated.

## 2017-12-03 NOTE — Progress Notes (Signed)
ENT Consult Progress Note  Subjective: Patient awake alert.  Currently on the ventilator.  Wife at bedside.  Pain controlled.  Right eye is taped.  Occlusion is good.   Objective: Vitals:   12/03/17 0732 12/03/17 0800  BP: 110/62 112/66  Pulse: (!) 110 87  Resp: 18 16  Temp:  (!) 100.6 F (38.1 C)  SpO2: 96% 97%    Physical Exam: Right facial nerve is weak with incomplete right eye closure  6 DCT trach in place, cuff inflated, on the vent.  Trach collar in tracheal stay sutures in place. Mandible is wired, MMF.  Vestibular incision intact. Right external auditory canal is currently dry.  No obvious leakage of CSF. Right periorbital ecchymosis   Data Review/Consults/Discussions: Reviewed Dr. Dorma Russell note- planning for surgery on Monday, 10/21.    Assessment: Willie Blevins 42 y.o. male who is s/p s/p Procedure(s): OPEN REDUCTION INTERNAL FIXATION (ORIF) MANDIBULAR FRACTURE (N/A) TRACHEOSTOMY (N/A) IRRIGATION AND DEBRIDEMENT RIGHT LOWER EXTREMITY AND VAC CHANGE (Right)   Plan:  Peridex mouthwash TID  Artificial tears to right eye every couple of hours while awake  Eye ointment and eye tape before naps and at bedtime. Eye tape should be diagonal. Demonstrated to wife how to perform.   When off the ventilator of the trach cuff can be deflated.  Dr. Jearld Fenton is planning for trach change on Monday or Tuesday.   Thank you for involving [facility] in the care of this patient. Should you need further assistance, please call our office at [phone].    Misty Stanley, MD
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
## 2017-12-04 LAB — GLUCOSE, CAPILLARY
GLUCOSE-CAPILLARY: 121 mg/dL — AB (ref 70–99)
Glucose-Capillary: 107 mg/dL — ABNORMAL HIGH (ref 70–99)
Glucose-Capillary: 110 mg/dL — ABNORMAL HIGH (ref 70–99)
Glucose-Capillary: 113 mg/dL — ABNORMAL HIGH (ref 70–99)
Glucose-Capillary: 128 mg/dL — ABNORMAL HIGH (ref 70–99)
Glucose-Capillary: 130 mg/dL — ABNORMAL HIGH (ref 70–99)
Glucose-Capillary: 90 mg/dL (ref 70–99)

## 2017-12-04 LAB — URINE CULTURE: Culture: NO GROWTH

## 2017-12-04 NOTE — Progress Notes (Signed)
Rehab Admissions Coordinator Note:  Patient was screened by Clois Dupes for appropriateness for an Inpatient Acute Rehab Consult per PT rec.   At this time, I will follow his progress medically for timing of rehab consult when appropriate.  Clois Dupes 12/04/2017, 10:42 AM  I can be reached at [phone].
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
## 2017-12-05 ENCOUNTER — Inpatient Hospital Stay (HOSPITAL_COMMUNITY): Payer: Worker's Compensation | Admitting: Certified Registered Nurse Anesthetist

## 2017-12-05 ENCOUNTER — Encounter (HOSPITAL_COMMUNITY): Admission: EM | Disposition: A | Discharge status: Rehabilitation Facility | Payer: Self-pay | Source: Home / Self Care

## 2017-12-05 HISTORY — PX: TYMPANOMASTOIDECTOMY: SHX34

## 2017-12-05 LAB — CBC
HCT: 30.7 % — ABNORMAL LOW (ref 39.0–52.0)
Hemoglobin: 10.2 g/dL — ABNORMAL LOW (ref 13.0–17.0)
MCH: 29.7 pg (ref 26.0–34.0)
MCHC: 33.2 g/dL (ref 30.0–36.0)
MCV: 89.2 fL (ref 80.0–100.0)
NRBC: 0.2 % (ref 0.0–0.2)
PLATELETS: 295 10*3/uL (ref 150–400)
RBC: 3.44 MIL/uL — ABNORMAL LOW (ref 4.22–5.81)
RDW: 13.3 % (ref 11.5–15.5)
WBC: 16.2 10*3/uL — AB (ref 4.0–10.5)

## 2017-12-05 LAB — BASIC METABOLIC PANEL
Anion gap: 9 (ref 5–15)
BUN: 12 mg/dL (ref 6–20)
CALCIUM: 7.8 mg/dL — AB (ref 8.9–10.3)
CO2: 24 mmol/L (ref 22–32)
CREATININE: 0.55 mg/dL — AB (ref 0.61–1.24)
Chloride: 94 mmol/L — ABNORMAL LOW (ref 98–111)
GFR calc non Af Amer: >60 mL/min (ref >60)
Glucose, Bld: 110 mg/dL — ABNORMAL HIGH (ref 70–99)
Potassium: 3.9 mmol/L (ref 3.5–5.1)
SODIUM: 127 mmol/L — AB (ref 135–145)

## 2017-12-05 LAB — GLUCOSE, CAPILLARY
GLUCOSE-CAPILLARY: 111 mg/dL — AB (ref 70–99)
GLUCOSE-CAPILLARY: 136 mg/dL — AB (ref 70–99)
Glucose-Capillary: 102 mg/dL — ABNORMAL HIGH (ref 70–99)
Glucose-Capillary: 123 mg/dL — ABNORMAL HIGH (ref 70–99)
Glucose-Capillary: 126 mg/dL — ABNORMAL HIGH (ref 70–99)

## 2017-12-05 LAB — CULTURE, RESPIRATORY

## 2017-12-05 LAB — TRIGLYCERIDES: TRIGLYCERIDES: 146 mg/dL (ref <150)

## 2017-12-05 LAB — CULTURE, RESPIRATORY W GRAM STAIN

## 2017-12-05 LAB — VANCOMYCIN, TROUGH: Vancomycin Tr: 7 ug/mL — ABNORMAL LOW (ref 15–20)

## 2017-12-05 LAB — SURGICAL PCR SCREEN
MRSA, PCR: NEGATIVE
STAPHYLOCOCCUS AUREUS: NEGATIVE

## 2017-12-05 SURGERY — TYMPANOPLASTY, WITH MASTOIDECTOMY
Anesthesia: General | Laterality: Right

## 2017-12-05 MED ORDER — VANCOMYCIN HCL IN DEXTROSE 1-5 GM/200ML-% IV SOLN
1000.0000 mg | INTRAVENOUS | Status: AC
  Administered 2017-12-05: 1000 mg via INTRAVENOUS
  Filled 2017-12-05: qty 200

## 2017-12-05 MED ORDER — MIDAZOLAM HCL 2 MG/2ML IJ SOLN
INTRAMUSCULAR | Status: AC
  Filled 2017-12-05: qty 2

## 2017-12-05 MED ORDER — EPINEPHRINE HCL (NASAL) 0.1 % NA SOLN
NASAL | Status: DC | PRN
  Administered 2017-12-05: 30 mL via NASAL

## 2017-12-05 MED ORDER — ROCURONIUM BROMIDE 10 MG/ML (PF) SYRINGE
PREFILLED_SYRINGE | INTRAVENOUS | Status: DC | PRN
  Administered 2017-12-05: 50 mg via INTRAVENOUS

## 2017-12-05 MED ORDER — LIDOCAINE 2% (20 MG/ML) 5 ML SYRINGE
INTRAMUSCULAR | Status: AC
  Filled 2017-12-05: qty 5

## 2017-12-05 MED ORDER — PROPOFOL 10 MG/ML IV BOLUS
INTRAVENOUS | Status: AC
  Filled 2017-12-05: qty 20

## 2017-12-05 MED ORDER — SODIUM CHLORIDE 0.9 % IV SOLN
INTRAVENOUS | Status: DC | PRN
  Administered 2017-12-05 (×2): via INTRAVENOUS

## 2017-12-05 MED ORDER — CIPROFLOXACIN-DEXAMETHASONE 0.3-0.1 % OT SUSP
OTIC | Status: DC | PRN
  Administered 2017-12-05: 4 [drp] via OTIC

## 2017-12-05 MED ORDER — DEXAMETHASONE SODIUM PHOSPHATE 10 MG/ML IJ SOLN
INTRAMUSCULAR | Status: DC | PRN
  Administered 2017-12-05: 10 mg via INTRAVENOUS

## 2017-12-05 MED ORDER — DOUBLE ANTIBIOTIC 500-10000 UNIT/GM EX OINT
TOPICAL_OINTMENT | CUTANEOUS | Status: AC
  Filled 2017-12-05: qty 1

## 2017-12-05 MED ORDER — FENTANYL CITRATE (PF) 250 MCG/5ML IJ SOLN
INTRAMUSCULAR | Status: AC
  Filled 2017-12-05: qty 5

## 2017-12-05 MED ORDER — DEXAMETHASONE SODIUM PHOSPHATE 10 MG/ML IJ SOLN
INTRAMUSCULAR | Status: AC
  Filled 2017-12-05: qty 1

## 2017-12-05 MED ORDER — PROPOFOL 1000 MG/100ML IV EMUL
5.0000 ug/kg/min | INTRAVENOUS | Status: DC
  Administered 2017-12-05: 42.337 ug/kg/min via INTRAVENOUS
  Administered 2017-12-05: 20 ug/kg/min via INTRAVENOUS
  Administered 2017-12-06: 35.281 ug/kg/min via INTRAVENOUS
  Administered 2017-12-06 (×2): 49.393 ug/kg/min via INTRAVENOUS
  Filled 2017-12-05 (×5): qty 100

## 2017-12-05 MED ORDER — GLYCOPYRROLATE PF 0.2 MG/ML IJ SOSY
PREFILLED_SYRINGE | INTRAMUSCULAR | Status: AC
  Filled 2017-12-05: qty 1

## 2017-12-05 MED ORDER — HYDROMORPHONE HCL 1 MG/ML IJ SOLN
INTRAMUSCULAR | Status: AC
  Filled 2017-12-05: qty 0.5

## 2017-12-05 MED ORDER — MIDAZOLAM HCL 2 MG/2ML IJ SOLN
INTRAMUSCULAR | Status: DC | PRN
  Administered 2017-12-05: 2 mg via INTRAVENOUS

## 2017-12-05 MED ORDER — SODIUM CHLORIDE 0.9 % IV SOLN
INTRAVENOUS | Status: AC
  Filled 2017-12-05: qty 500000

## 2017-12-05 MED ORDER — SODIUM CHLORIDE 0.9 % IV SOLN
2.0000 g | Freq: Two times a day (BID) | INTRAVENOUS | Status: DC
  Filled 2017-12-05 (×2): qty 2

## 2017-12-05 MED ORDER — EPINEPHRINE HCL (NASAL) 0.1 % NA SOLN
NASAL | Status: AC
  Filled 2017-12-05: qty 30

## 2017-12-05 MED ORDER — SODIUM CHLORIDE 0.9 % IV SOLN
INTRAVENOUS | Status: DC | PRN
  Administered 2017-12-05: 15 ug/min via INTRAVENOUS

## 2017-12-05 MED ORDER — LACTATED RINGERS IV SOLN
INTRAVENOUS | Status: DC | PRN
  Administered 2017-12-05: 12:00:00 via INTRAVENOUS

## 2017-12-05 MED ORDER — LIDOCAINE-EPINEPHRINE 1 %-1:100000 IJ SOLN
INTRAMUSCULAR | Status: DC | PRN
  Administered 2017-12-05: 20 mL via INTRADERMAL

## 2017-12-05 MED ORDER — PHENYLEPHRINE 40 MCG/ML (10ML) SYRINGE FOR IV PUSH (FOR BLOOD PRESSURE SUPPORT)
PREFILLED_SYRINGE | INTRAVENOUS | Status: AC
  Filled 2017-12-05: qty 30

## 2017-12-05 MED ORDER — SODIUM CHLORIDE 0.9 % IR SOLN
Status: DC | PRN
  Administered 2017-12-05 (×2): 1000 mL

## 2017-12-05 MED ORDER — CIPROFLOXACIN-DEXAMETHASONE 0.3-0.1 % OT SUSP
OTIC | Status: AC
  Filled 2017-12-05: qty 7.5

## 2017-12-05 MED ORDER — PHENYLEPHRINE 40 MCG/ML (10ML) SYRINGE FOR IV PUSH (FOR BLOOD PRESSURE SUPPORT)
PREFILLED_SYRINGE | INTRAVENOUS | Status: AC
  Filled 2017-12-05: qty 10

## 2017-12-05 MED ORDER — METHYLENE BLUE 0.5 % INJ SOLN
INTRAVENOUS | Status: AC
  Filled 2017-12-05: qty 10

## 2017-12-05 MED ORDER — ROCURONIUM BROMIDE 50 MG/5ML IV SOSY
PREFILLED_SYRINGE | INTRAVENOUS | Status: AC
  Filled 2017-12-05: qty 10

## 2017-12-05 MED ORDER — PROPOFOL 10 MG/ML IV BOLUS
INTRAVENOUS | Status: DC | PRN
  Administered 2017-12-05: 40 mg via INTRAVENOUS
  Administered 2017-12-05: 50 mg via INTRAVENOUS

## 2017-12-05 MED ORDER — PROPOFOL 500 MG/50ML IV EMUL
INTRAVENOUS | Status: DC | PRN
  Administered 2017-12-05: 25 ug/kg/min via INTRAVENOUS

## 2017-12-05 MED ORDER — LIDOCAINE HCL 2 % IJ SOLN
INTRAMUSCULAR | Status: AC
  Filled 2017-12-05: qty 20

## 2017-12-05 MED ORDER — ONDANSETRON HCL 4 MG/2ML IJ SOLN
INTRAMUSCULAR | Status: DC | PRN
  Administered 2017-12-05 (×2): 4 mg via INTRAVENOUS

## 2017-12-05 MED ORDER — LIDOCAINE HCL 2 % IJ SOLN
INTRAMUSCULAR | Status: DC | PRN
  Administered 2017-12-05: 20 mL

## 2017-12-05 MED ORDER — METHYLENE BLUE 0.5 % INJ SOLN
INTRAVENOUS | Status: DC | PRN
  Administered 2017-12-05: 1 mL

## 2017-12-05 MED ORDER — EPINEPHRINE PF 1 MG/ML IJ SOLN
INTRAMUSCULAR | Status: DC | PRN
  Administered 2017-12-05: 1 mg

## 2017-12-05 MED ORDER — EPINEPHRINE PF 1 MG/ML IJ SOLN
INTRAMUSCULAR | Status: AC
  Filled 2017-12-05: qty 1

## 2017-12-05 MED ORDER — EPHEDRINE 5 MG/ML INJ
INTRAVENOUS | Status: AC
  Filled 2017-12-05: qty 10

## 2017-12-05 MED ORDER — SODIUM CHLORIDE 0.9 % IV SOLN
INTRAVENOUS | Status: DC | PRN
  Administered 2017-12-05: 500 mL

## 2017-12-05 MED ORDER — HEMOSTATIC AGENTS (NO CHARGE) OPTIME
TOPICAL | Status: DC | PRN
  Administered 2017-12-05 (×2): 1 via TOPICAL

## 2017-12-05 MED ORDER — LIDOCAINE-EPINEPHRINE 1 %-1:100000 IJ SOLN
INTRAMUSCULAR | Status: AC
  Filled 2017-12-05: qty 1

## 2017-12-05 MED ORDER — ROCURONIUM BROMIDE 50 MG/5ML IV SOSY
PREFILLED_SYRINGE | INTRAVENOUS | Status: AC
  Filled 2017-12-05: qty 5

## 2017-12-05 MED ORDER — PROPOFOL 1000 MG/100ML IV EMUL: 0.0000 ug/kg/min | INTRAVENOUS | Status: DC

## 2017-12-05 MED ORDER — VANCOMYCIN HCL 10 G IV SOLR
1250.0000 mg | Freq: Three times a day (TID) | INTRAVENOUS | Status: DC
  Administered 2017-12-05 – 2017-12-06 (×3): 1250 mg via INTRAVENOUS
  Filled 2017-12-05 (×3): qty 1250

## 2017-12-05 MED ORDER — BACITRACIN ZINC 500 UNIT/GM EX OINT
TOPICAL_OINTMENT | CUTANEOUS | Status: DC | PRN
  Administered 2017-12-05: 1 via TOPICAL

## 2017-12-05 MED ORDER — HYDROMORPHONE HCL 1 MG/ML IJ SOLN
INTRAMUSCULAR | Status: DC | PRN
  Administered 2017-12-05: 0.5 mg via INTRAVENOUS

## 2017-12-05 MED ORDER — FENTANYL CITRATE (PF) 100 MCG/2ML IJ SOLN
100.0000 ug | INTRAMUSCULAR | Status: DC | PRN
  Administered 2017-12-06 – 2017-12-07 (×7): 100 ug via INTRAVENOUS
  Filled 2017-12-05 (×7): qty 2

## 2017-12-05 MED ORDER — SODIUM CHLORIDE 0.9 % IV SOLN: 2.0000 g | Freq: Two times a day (BID) | INTRAVENOUS | Status: DC

## 2017-12-05 MED ORDER — HEMOSTATIC AGENTS (NO CHARGE) OPTIME
TOPICAL | Status: DC | PRN
  Administered 2017-12-05: 1 via TOPICAL

## 2017-12-05 MED ORDER — HYDROGEN PEROXIDE 3 % EX SOLN
CUTANEOUS | Status: DC | PRN
  Administered 2017-12-05: 1

## 2017-12-05 MED ORDER — FENTANYL CITRATE (PF) 100 MCG/2ML IJ SOLN: 100.0000 ug | INTRAMUSCULAR | Status: DC | PRN

## 2017-12-05 MED ORDER — ONDANSETRON HCL 4 MG/2ML IJ SOLN
INTRAMUSCULAR | Status: AC
  Filled 2017-12-05: qty 4

## 2017-12-05 MED ORDER — SUGAMMADEX SODIUM 200 MG/2ML IV SOLN
INTRAVENOUS | Status: DC | PRN
  Administered 2017-12-05: 200 mg via INTRAVENOUS

## 2017-12-05 MED ORDER — SODIUM CHLORIDE 0.9 % IR SOLN
Status: DC | PRN
  Administered 2017-12-05: 1000 mL

## 2017-12-05 MED ORDER — STERILE WATER FOR IRRIGATION IR SOLN
Status: DC | PRN
  Administered 2017-12-05: 1000 mL

## 2017-12-05 MED ORDER — FENTANYL CITRATE (PF) 100 MCG/2ML IJ SOLN
INTRAMUSCULAR | Status: DC | PRN
  Administered 2017-12-05 (×10): 50 ug via INTRAVENOUS

## 2017-12-05 SURGICAL SUPPLY — 86 items
ATTRACTOMAT 16X20 MAGNETIC DRP (DRAPES) ×2 IMPLANT
BAG DECANTER FOR FLEXI CONT (MISCELLANEOUS) ×5 IMPLANT
BLADE NDL 3 SS STRL (BLADE) ×1 IMPLANT
BLADE NEEDLE 3 SS STRL (BLADE) ×2 IMPLANT
BLADE NEEDLE 3MM SS STRL (BLADE) ×1
BUR ROUND FLUTED 5 RND (BURR) ×1 IMPLANT
BUR ROUND FLUTED 5MM RND (BURR) ×1
BUR SABER DIAMOND 3.0 (BURR) ×2 IMPLANT
BUR SABER RD CUTTING 3.0 (BURR) ×1 IMPLANT
BUR SABER RD CUTTING 3.0MM (BURR) ×1
BUR SABER TAPERED DIAMOND 1 (BURR) ×2 IMPLANT
BUR SABER TAPERED DIAMOND 2 (BURR) ×2 IMPLANT
BUR SABER TAPERED RD 1.0 (BURR) ×1 IMPLANT
BUR SABER TAPERED RD 1.0MM (BURR) ×1
BUR STRYKER TAPERED RND 2.0M (BURR) ×2 IMPLANT
CLOSURE WOUND 1/2 X4 (GAUZE/BANDAGES/DRESSINGS) ×1
CONT SPEC 4OZ CLIKSEAL STRL BL (MISCELLANEOUS) ×2 IMPLANT
CORD BIPOLAR FORCEPS 12FT (ELECTRODE) ×2 IMPLANT
COTTONBALL LRG STERILE PKG (GAUZE/BANDAGES/DRESSINGS) ×3 IMPLANT
COVER SURGICAL LIGHT HANDLE (MISCELLANEOUS) ×3 IMPLANT
DECANTER SPIKE VIAL GLASS SM (MISCELLANEOUS) ×2 IMPLANT
DEPRESSOR TONGUE BLADE STERILE (MISCELLANEOUS) ×2 IMPLANT
DRAIN PENROSE 1/4X12 LTX STRL (WOUND CARE) ×2 IMPLANT
DRAPE INCISE IOBAN 66X45 STRL (DRAPES) ×2 IMPLANT
DRAPE MICROSCOPE LEICA 46X105 (MISCELLANEOUS) ×2 IMPLANT
DRAPE MICROSCOPE LEICA 54X105 (DRAPE) ×3 IMPLANT
DRAPE OEC MINIVIEW 54X84 (DRAPES) ×2 IMPLANT
DRAPE ORTHO SPLIT 77X108 STRL (DRAPES) ×2
DRAPE SURG 17X23 STRL (DRAPES) ×2 IMPLANT
DRAPE SURG ORHT 6 SPLT 77X108 (DRAPES) IMPLANT
DRSG GLASSCOCK MASTOID ADT (GAUZE/BANDAGES/DRESSINGS) ×2 IMPLANT
ELECT COATED BLADE 2.86 ST (ELECTRODE) ×3 IMPLANT
ELECT PAIRED SUBDERMAL (MISCELLANEOUS) ×3
ELECTRODE PAIRED SUBDERMAL (MISCELLANEOUS) IMPLANT
FILTER STRAW FLUID ASPIR (MISCELLANEOUS) ×2 IMPLANT
GAUZE 4X4 16PLY RFD (DISPOSABLE) ×2 IMPLANT
GAUZE PACKING IODOFORM 1/4X15 (GAUZE/BANDAGES/DRESSINGS) ×2 IMPLANT
GAUZE PACKING IODOFORM 1X5 (MISCELLANEOUS) ×2 IMPLANT
GLOVE BIOGEL PI IND STRL 6 (GLOVE) IMPLANT
GLOVE BIOGEL PI IND STRL 7.0 (GLOVE) IMPLANT
GLOVE BIOGEL PI IND STRL 8 (GLOVE) IMPLANT
GLOVE BIOGEL PI INDICATOR 6 (GLOVE) ×2
GLOVE BIOGEL PI INDICATOR 7.0 (GLOVE) ×2
GLOVE BIOGEL PI INDICATOR 8 (GLOVE) ×2
GLOVE SURG SS PI 7.0 STRL IVOR (GLOVE) ×2 IMPLANT
GOWN STRL REUS W/ TWL LRG LVL3 (GOWN DISPOSABLE) ×2 IMPLANT
GOWN STRL REUS W/ TWL XL LVL3 (GOWN DISPOSABLE) IMPLANT
GOWN STRL REUS W/TWL LRG LVL3 (GOWN DISPOSABLE) ×10
GOWN STRL REUS W/TWL XL LVL3 (GOWN DISPOSABLE) ×4
HEMOSTAT SNOW SURGICEL 2X4 (HEMOSTASIS) ×2 IMPLANT
KIT BASIN OR (CUSTOM PROCEDURE TRAY) ×3 IMPLANT
MANIFOLD NEPTUNE WASTE (CANNULA) ×2 IMPLANT
MARKER SKIN DUAL TIP RULER LAB (MISCELLANEOUS) ×2 IMPLANT
NDL 18GX1X1/2 (RX/OR ONLY) (NEEDLE) IMPLANT
NDL 25GX 5/8IN NON SAFETY (NEEDLE) IMPLANT
NDL FILTER BLUNT 18X1 1/2 (NEEDLE) IMPLANT
NDL HYPO 25GX1X1/2 BEV (NEEDLE) ×1 IMPLANT
NEEDLE 18GX1X1/2 (RX/OR ONLY) (NEEDLE) ×3 IMPLANT
NEEDLE 25GX 5/8IN NON SAFETY (NEEDLE) ×3 IMPLANT
NEEDLE FILTER BLUNT 18X 1/2SAF (NEEDLE) ×2
NEEDLE FILTER BLUNT 18X1 1/2 (NEEDLE) ×1 IMPLANT
NEEDLE HYPO 25GX1X1/2 BEV (NEEDLE) ×6 IMPLANT
OTO Wick ×4 IMPLANT
PATTIES SURGICAL .25X.25 (GAUZE/BANDAGES/DRESSINGS) ×2 IMPLANT
PENCIL BUTTON HOLSTER BLD 10FT (ELECTRODE) ×3 IMPLANT
PROBE NERVBE PRASS .33 (MISCELLANEOUS) ×2 IMPLANT
PUNCH BIOPSY DERMAL 6MM STRL (MISCELLANEOUS) ×2 IMPLANT
SET CYSTO W/LG BORE CLAMP LF (SET/KITS/TRAYS/PACK) ×2 IMPLANT
SOLUTION BETADINE 4OZ (MISCELLANEOUS) ×2 IMPLANT
SPONGE SURGIFOAM ABS GEL 12-7 (HEMOSTASIS) ×4 IMPLANT
STRIP CLOSURE SKIN 1/2X4 (GAUZE/BANDAGES/DRESSINGS) ×2 IMPLANT
SUT BONE WAX W31G (SUTURE) ×3 IMPLANT
SUT CHROMIC 3 0 PS 2 (SUTURE) ×4 IMPLANT
SUT ETHILON 5 0 CL P 3 (SUTURE) ×2 IMPLANT
SUT ETHILON 5 0 P 3 18 (SUTURE) ×2
SUT ETHILON 5 0 PS 2 18 (SUTURE) ×2 IMPLANT
SUT NYLON ETHILON 5-0 P-3 1X18 (SUTURE) IMPLANT
SYR 3ML LL SCALE MARK (SYRINGE) ×3 IMPLANT
SYR TB 1ML LUER SLIP (SYRINGE) ×6 IMPLANT
TOWEL GREEN STERILE (TOWEL DISPOSABLE) ×2 IMPLANT
TRAY ENT MC OR (CUSTOM PROCEDURE TRAY) ×3 IMPLANT
TUBE CONNECTING 12'X1/4 (SUCTIONS) ×1
TUBE CONNECTING 12X1/4 (SUCTIONS) ×1 IMPLANT
TUBING EXTENTION W/L.L. (IV SETS) ×3 IMPLANT
TUBING IRRIGATION (MISCELLANEOUS) ×3 IMPLANT
WIPE INSTRUMENT VISIWIPE 73X73 (MISCELLANEOUS) ×3 IMPLANT

## 2017-12-05 NOTE — Progress Notes (Signed)
4 Days Post-Op   Subjective/Chief Complaint: On vent for two days. Coughing a lot   Objective: Vital signs in last 24 hours: Temp:  [99.2 F (37.3 C)-100.7 F (38.2 C)] 100.5 F (38.1 C) (10/21 0400) Pulse Rate:  [80-122] 122 (10/21 0600) Resp:  [15-34] 34 (10/21 0600) BP: (123-146)/(67-99) 146/99 (10/21 0600) SpO2:  [98 %-100 %] 100 % (10/21 0600) FiO2 (%):  [50 %] 50 % (10/21 0555) Last BM Date: (PTA)  Intake/Output from previous day: 10/20 0701 - 10/21 0700 In: 5952.4 [I.V.:3769.8; NG/GT:1400; IV Piggyback:782.6] Out: 3150 [Urine:3150] Intake/Output this shift: No intake/output data recorded.  trach in place and open. wires tight. eye taped shut  Lab Results:  Recent Labs    12/03/17 0501  WBC 12.1*  HGB 9.1*  HCT 29.6*  PLT 259   BMET Recent Labs    12/03/17 0501  NA 137  K 3.8  CL 104  CO2 25  GLUCOSE 128*  BUN 15  CREATININE 0.72  CALCIUM 7.6*   PT/INR No results for input(s): LABPROT, INR in the last 72 hours. ABG No results for input(s): PHART, HCO3 in the last 72 hours.  Invalid input(s): PCO2, PO2  Studies/Results: Dg Chest Port 1 View  Result Date: 12/03/2017 CLINICAL DATA:  Fever. EXAM: PORTABLE CHEST 1 VIEW COMPARISON:  12/01/2017 FINDINGS: Endotracheal tube has been removed and the patient now has a tracheostomy tube. Nasogastric tube has been removed. Feeding tube extends into the abdomen. Again noted is bullous emphysema in the upper lungs. Persistent densities in the lower chest with slightly improved aeration. Probable left pleural fluid. Heart size is grossly normal for size. Right jugular central line in the upper SVC region and unchanged. IMPRESSION: 1. Interval placement of tracheostomy tube. Placement of feeding tube. 2. Persistent bibasilar chest disease with slightly improved aeration. Probable left pleural fluid. 3. Bullous emphysematous changes in the upper lungs. Electronically Signed   By: Richarda Overlie M.D.   On: 12/03/2017 13:14     Anti-infectives: Anti-infectives (From admission, onward)   Start     Dose/Rate Route Frequency Ordered Stop   12/03/17 1200  vancomycin (VANCOCIN) IVPB 1000 mg/200 mL premix     1,000 mg 200 mL/hr over 60 Minutes Intravenous Every 8 hours 12/03/17 1050     12/03/17 1200  ceFEPIme (MAXIPIME) 2 g in sodium chloride 0.9 % 100 mL IVPB     2 g 200 mL/hr over 30 Minutes Intravenous Every 12 hours 12/03/17 1050     12/01/17 1032  tobramycin (NEBCIN) powder  Status:  Discontinued       As needed 12/01/17 1032 12/01/17 1105   12/01/17 1031  vancomycin (VANCOCIN) powder  Status:  Discontinued       As needed 12/01/17 1031 12/01/17 1105   11/29/17 2000  ceFAZolin (ANCEF) IVPB 2g/100 mL premix     2 g 200 mL/hr over 30 Minutes Intravenous Every 8 hours 11/29/17 1617 12/01/17 1326   11/29/17 1334  vancomycin (VANCOCIN) powder  Status:  Discontinued       As needed 11/29/17 1335 11/29/17 1547   11/29/17 1327  tobramycin (NEBCIN) powder  Status:  Discontinued       As needed 11/29/17 1334 11/29/17 1547      Assessment/Plan: s/p Procedure(s): OPEN REDUCTION INTERNAL FIXATION (ORIF) MANDIBULAR FRACTURE (N/A) TRACHEOSTOMY (N/A) IRRIGATION AND DEBRIDEMENT RIGHT LOWER EXTREMITY AND VAC CHANGE (Right) he is going to surgery today with Dr Dorma Russell. Tape over eye vertically and replaced and showed family to  place it on upper lid horzontally. He is still on vent.   LOS: 6 days    Willie Blevins 12/05/2017

## 2017-12-05 NOTE — Anesthesia Postprocedure Evaluation (Signed)
Anesthesia Post Note  Patient: Willie Blevins  Procedure(s) Performed: right canal up tympanomastoid, right mastoidectomy, exploration right facial nerve, Type 1 tympanoplasty, CSF leak repair. (Right )     Patient location during evaluation: NICU Anesthesia Type: General Level of consciousness: sedated and patient remains intubated per anesthesia plan Pain management: pain level controlled Vital Signs Assessment: post-procedure vital signs reviewed and stable Respiratory status: patient remains intubated per anesthesia plan and patient on ventilator - see flowsheet for VS Cardiovascular status: stable and blood pressure returned to baseline Postop Assessment: no apparent nausea or vomiting Anesthetic complications: no    Last Vitals:  Vitals:   12/05/17 2010 12/05/17 2100  BP:  115/78  Pulse: 86 89  Resp: 16 16  Temp:    SpO2: 97% 94%    Last Pain:  Vitals:   12/05/17 2000  TempSrc: Axillary  PainSc:                  Willie Blevins
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
## 2017-12-05 NOTE — Procedures (Signed)
Bedside Audiometric Evaluation  Name:  Willie Blevins Willie Blevins DOB:   01/12/1976 MRN:    [id number]  Reason for Referral: Pre-op audiogram: surgery today at 11:30 with Dr. 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  Pain assessed prior to hearing test by PT.  Audiological Assessment:  Dr. Dorma Russell' stated his primary interest was bone conduction results of the right ear.  Audiometric Results in dB:               Test reliability fair - pain medication made Mr. Willie Blevins sleepy.  Had to wake him up often to continue testing  500 Hz 1000 Hz  2000   Hz 4000 Hz 8000 Hz  Right ear Air conduction DNT DNT DNT DNT DNT  Right ear *Bone conduction 40 45 60 60 -  Left ear Air conduction CNT 25 45 55 65  *Bone conduction was masked with 95-100 dB HL narrowband noise at each frequency  Impression: Today's results show the presence of right cochlear function following Willie Blevins head injury.  Bone conduction results at 60dB showed good reliability with quick responses; however cross-over to the left ear cannot be ruled out.  Mr. 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 Due to Mr. Willie Blevins sleepiness, thresholds in the left ear may be better than today's results indicated. Overall test reliability was fair due to patient's pain medications making him sleepy.  I had to re-instruct and wake him up often.    Patient/Family Education:  The patient agreed to testing per MD order.  The test results and recommendations were explained to Dr. Dorma Russell via phone.  Recommendations:  Follow up at Dr. Dorma Russell for further recommendations  If you have any questions, please call [phone].  Sherri A. Earlene Plater, Au.D., [facility] Doctor of Audiology  12/05/2017 2:56 PM

## 2017-12-05 NOTE — Brief Op Note (Signed)
12/05/2017  4:55 PM  PATIENT:  Willie Blevins  42 y.o. male  PRE-OPERATIVE DIAGNOSIS:  RIGHT LONGITUDINAL TEMPORAL BONE FRACTURE WITH COMPLETE RIGHT FACIAL PARALYSIS, FRACTURE/DISLOCATION OF THE RIGHT INCUS, POSSIBLE CSF LEAK FROM TEGMEN TYMPANI, 40% ANTERIOR INFERIOR TYMPANIC MEMBRANE PERFORATION  POST-OPERATIVE DIAGNOSIS:  RIGHT LONGITUDINAL TEMPORAL BONE FRACTURE WITH COMPLETE RIGHT FACIAL PARALYSIS, AVULSION OF RIGHT FACIAL NERVE FROM THE INTERNAL AUDITORY CANAL,  FRACTURE/DISLOCATION OF THE RIGHT INCUS, CSF LEAK FROM TEGMEN TYMPANI, 40% ANTERIOR INFERIOR TYMPANIC MEMBRANE PERFORATION  PROCEDURE:  RIGHT CANAL UP TYMPANOMASTOIDECTOMY, EXPLORATION OF THE RIGHT FACIAL NERVE, PARTIAL RIGHT FACIAL NERVE DECOMPRESSION, REMOVAL OF FRACTURE/DISLOCATED INCUS FROM FLOOR OF THE MIDDLE FOSSA, REPAIR OF CSF LEAK FROM TEGMEN TYMPANI, TYPE I MEDIAL FASCIA GRAFT TYMPANOPLASTY  SURGEON:  Surgeon(s) and Role:    Ermalinda Barrios, MD - Primary  PHYSICIAN ASSISTANT:   ASSISTANTS: none   ANESTHESIA:   general via tracheostomy tube  EBL:  < 20 ml  BLOOD ADMINISTERED:none  DRAINS: Penrose drain in the right mastoid cavity   LOCAL MEDICATIONS USED:  XYLOCAINE 9ml  SPECIMEN:  Source of Specimen:  fractured right incus (2 pieces: long process & body) sent for gross only   DISPOSITION OF SPECIMEN:  PATHOLOGY for gross only examination & documentation  COUNTS:  YES  TOURNIQUET:  * No tourniquets in log *  DICTATION: .Dragon Dictation  PLAN OF CARE: Admit to inpatient - 4N Neuro ICU   PATIENT DISPOSITION:  Transferred from OR to 4N Neuro ICU   Delay start of Pharmacological VTE agent (>24hrs) due to surgical blood loss or risk of bleeding: yes

## 2017-12-05 NOTE — Progress Notes (Signed)
0600.  Patient's coretrek NGT hanging out.  Patient's mittens previously removed by family.  Coretrek removed completely and placed in plastic bag.  Patient suctioned, tolerated poorly.  Pt's clear in upper lobes, diminished in lower lobes, similar to previous assessment.

## 2017-12-05 NOTE — Transfer of Care (Signed)
Immediate Anesthesia Transfer of Care Note  Patient: Willie Blevins [name]  Procedure(s) Performed: right canal up tympanomastoid, right mastoidectomy, exploration right facial nerve, Type 1 tympanoplasty, CSF leak repair. (Right )  Patient Location: PACU  Anesthesia Type:General  Level of Consciousness: Patient remains intubated per anesthesia plan  Airway & Oxygen Therapy: Patient remains intubated per anesthesia plan and Patient placed on Ventilator (see vital sign flow sheet for setting)  Post-op Assessment: Report given to RN and Post -op Vital signs reviewed and stable  Post vital signs: Reviewed and stable  Last Vitals:  Vitals Value Taken Time  BP 115/57 12/05/2017  4:54 PM  Temp    Pulse 102 12/05/2017  4:58 PM  Resp 15 12/05/2017  4:58 PM  SpO2 95 % 12/05/2017  4:58 PM  Vitals shown include unvalidated device data.  Last Pain:  Vitals:   12/05/17 0800  TempSrc: Axillary  PainSc:          Complications: No apparent anesthesia complications
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
## 2017-12-05 NOTE — Interval H&P Note (Signed)
History and Physical Interval Note:  12/05/2017 11:13 AM  Willie Blevins  has presented today for surgery, with the diagnosis of right logitudinal temporal bone fracture with probable ossicular discontinuity and complete, traumatic right facial nerve paralysis.  The various methods of treatment have been discussed with the patient and his wife. After consideration of risks, benefits and other options for treatment, the patient has consented to a right transmastoid facial nerve decompress, possible tympanoplasty, possible ossiculoplasty, possible repair of CSF otorrhea as a surgical intervention.  The patient's history has been reviewed, patient examined, no change in status, and stable for surgery.  I have reviewed the patient's chart and labs.  Questions were answered to the patient's and wife's satisfaction.  Preoperative bedside audiometric evaluation was performed today by Lu Duffel, AuD of [facility]. Bone conduction, masked and unmasked, was performed AD from 500-4000Hz  with responses at 40-60 dB using 100 dB of masking AS. He could be crossing over to the left ear. 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 Discrimination ability could not be tested at the bedside. Results were given to the patient's wife at the bedside by myself. The patient and his wife understand that there are no guarantees that he will regain right facial motion and that return of facial function may take up to 12 months.   Ermalinda Barrios
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
## 2017-12-05 NOTE — Anesthesia Preprocedure Evaluation (Addendum)
Anesthesia Evaluation  Patient identified by MRN, date of birth, ID band Patient awake    Reviewed: Allergy & Precautions, NPO status , Patient's Chart, lab work & pertinent test results  Airway Mallampati: Trach       Dental  (+) Chipped   Pulmonary     + decreased breath sounds      Cardiovascular  Rhythm:Regular Rate:Normal     Neuro/Psych    GI/Hepatic   Endo/Other    Renal/GU      Musculoskeletal   Abdominal   Peds  Hematology   Anesthesia Other Findings   Reproductive/Obstetrics                            Anesthesia Physical Anesthesia Plan  ASA: III  Anesthesia Plan: General   Post-op Pain Management:    Induction: Intravenous  PONV Risk Score and Plan: Ondansetron and Dexamethasone  Airway Management Planned: Tracheostomy  Additional Equipment:   Intra-op Plan:   Post-operative Plan: Post-operative intubation/ventilation  Informed Consent: I have reviewed the patients History and Physical, chart, labs and discussed the procedure including the risks, benefits and alternatives for the proposed anesthesia with the patient or authorized representative who has indicated his/her understanding and acceptance.     Plan Discussed with: CRNA and Anesthesiologist  Anesthesia Plan Comments:         Anesthesia Quick Evaluation

## 2017-12-06 ENCOUNTER — Encounter (HOSPITAL_COMMUNITY): Payer: Self-pay | Admitting: Physical Medicine and Rehabilitation

## 2017-12-06 DIAGNOSIS — S069X9A Unspecified intracranial injury with loss of consciousness of unspecified duration, initial encounter: Secondary | ICD-10-CM

## 2017-12-06 DIAGNOSIS — G8918 Other acute postprocedural pain: Secondary | ICD-10-CM

## 2017-12-06 DIAGNOSIS — T1490XA Injury, unspecified, initial encounter: Secondary | ICD-10-CM

## 2017-12-06 DIAGNOSIS — S8782XA Crushing injury of left lower leg, initial encounter: Secondary | ICD-10-CM

## 2017-12-06 DIAGNOSIS — E871 Hypo-osmolality and hyponatremia: Secondary | ICD-10-CM

## 2017-12-06 DIAGNOSIS — T148XXA Other injury of unspecified body region, initial encounter: Secondary | ICD-10-CM

## 2017-12-06 DIAGNOSIS — J439 Emphysema, unspecified: Secondary | ICD-10-CM

## 2017-12-06 DIAGNOSIS — S0292XA Unspecified fracture of facial bones, initial encounter for closed fracture: Secondary | ICD-10-CM

## 2017-12-06 DIAGNOSIS — R739 Hyperglycemia, unspecified: Secondary | ICD-10-CM

## 2017-12-06 DIAGNOSIS — S36113A Laceration of liver, unspecified degree, initial encounter: Secondary | ICD-10-CM

## 2017-12-06 LAB — CBC WITH DIFFERENTIAL/PLATELET
Abs Immature Granulocytes: 0.22 10*3/uL — ABNORMAL HIGH (ref 0.00–0.07)
Basophils Absolute: 0 10*3/uL (ref 0.0–0.1)
Basophils Relative: 0 %
EOS ABS: 0 10*3/uL (ref 0.0–0.5)
EOS PCT: 0 %
HEMATOCRIT: 28.9 % — AB (ref 39.0–52.0)
HEMOGLOBIN: 9.5 g/dL — AB (ref 13.0–17.0)
IMMATURE GRANULOCYTES: 2 %
LYMPHS ABS: 0.8 10*3/uL (ref 0.7–4.0)
Lymphocytes Relative: 8 %
MCH: 29.1 pg (ref 26.0–34.0)
MCHC: 32.9 g/dL (ref 30.0–36.0)
MCV: 88.7 fL (ref 80.0–100.0)
MONO ABS: 0.8 10*3/uL (ref 0.1–1.0)
MONOS PCT: 8 %
Neutro Abs: 9 10*3/uL — ABNORMAL HIGH (ref 1.7–7.7)
Neutrophils Relative %: 82 %
Platelets: 295 10*3/uL (ref 150–400)
RBC: 3.26 MIL/uL — ABNORMAL LOW (ref 4.22–5.81)
RDW: 13.3 % (ref 11.5–15.5)
WBC: 10.9 10*3/uL — ABNORMAL HIGH (ref 4.0–10.5)
nRBC: 0.3 % — ABNORMAL HIGH (ref 0.0–0.2)

## 2017-12-06 LAB — GLUCOSE, CAPILLARY
GLUCOSE-CAPILLARY: 98 mg/dL (ref 70–99)
Glucose-Capillary: 108 mg/dL — ABNORMAL HIGH (ref 70–99)
Glucose-Capillary: 116 mg/dL — ABNORMAL HIGH (ref 70–99)
Glucose-Capillary: 126 mg/dL — ABNORMAL HIGH (ref 70–99)
Glucose-Capillary: 93 mg/dL (ref 70–99)
Glucose-Capillary: 94 mg/dL (ref 70–99)

## 2017-12-06 LAB — BASIC METABOLIC PANEL
ANION GAP: 9 (ref 5–15)
BUN: 11 mg/dL (ref 6–20)
CALCIUM: 7.9 mg/dL — AB (ref 8.9–10.3)
CO2: 22 mmol/L (ref 22–32)
Chloride: 97 mmol/L — ABNORMAL LOW (ref 98–111)
Creatinine, Ser: 0.61 mg/dL (ref 0.61–1.24)
GFR calc non Af Amer: >60 mL/min (ref >60)
Glucose, Bld: 123 mg/dL — ABNORMAL HIGH (ref 70–99)
Potassium: 4.5 mmol/L (ref 3.5–5.1)
SODIUM: 128 mmol/L — AB (ref 135–145)

## 2017-12-06 MED ORDER — BETHANECHOL CHLORIDE 25 MG PO TABS
25.0000 mg | ORAL_TABLET | Freq: Three times a day (TID) | ORAL | Status: DC
  Administered 2017-12-07 – 2017-12-13 (×12): 25 mg
  Filled 2017-12-06: qty 3
  Filled 2017-12-06: qty 1
  Filled 2017-12-06 (×3): qty 3
  Filled 2017-12-06: qty 1
  Filled 2017-12-06: qty 3
  Filled 2017-12-06: qty 1
  Filled 2017-12-06 (×2): qty 3
  Filled 2017-12-06: qty 1
  Filled 2017-12-06: qty 3

## 2017-12-06 MED ORDER — QUETIAPINE FUMARATE 25 MG PO TABS
50.0000 mg | ORAL_TABLET | Freq: Two times a day (BID) | ORAL | Status: DC
  Administered 2017-12-07 – 2017-12-08 (×4): 50 mg
  Filled 2017-12-06 (×4): qty 2

## 2017-12-06 MED ORDER — SODIUM CHLORIDE 0.9 % IV SOLN
2.0000 g | INTRAVENOUS | Status: DC
  Administered 2017-12-06 – 2017-12-12 (×7): 2 g via INTRAVENOUS
  Filled 2017-12-06 (×8): qty 20

## 2017-12-06 MED ORDER — SODIUM CHLORIDE 0.9 % IV SOLN
INTRAVENOUS | Status: DC
  Administered 2017-12-06 – 2017-12-08 (×4): via INTRAVENOUS
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
## 2017-12-06 NOTE — Progress Notes (Signed)
S: The patient was awake and sitting up in bed surrounded by his wife and friends.  He did not report any otalgia.  O: Packed his right face remains completely paralyzed as expected.  The postauricular incision was stable.  No signs of hematoma.  The right postauricular drain was removed without difficulty.  No signs of infection.  Ear canal packing remained stable.  A:  1. Stable postoperative course following a right transmastoid facial nerve exploration and partial decompression. 2. Right postauricular drain removed without difficulty.  We will remove right ear canal packing in approximately 1 week.  P: 1. It is very important to to new artificial tears in the right eye and Lacri-Lube ointment in the right eye nightly diagonal lid taping to protect the patient's right cornea. 2. Eventually,  the patient may require a right lateral tarsorrhaphy and gold weight implant of his right upper eyelid.  May eventually be a candidate for a right glossal to facial nerve graft a cross face graft. 3. Eventually, patient will require a staged right tympanoplasty with ossiculoplasty to reconnect his stapes with his tympanic membrane or malleus handle. 4. Once discharged, the patient's hearing will be rechecked by my audiologists.

## 2017-12-06 NOTE — Progress Notes (Signed)
Rehab admissions - I am following along for potential acute inpatient rehab admission.  Not medically ready at this point and not able to participate fully with therapies yet.  I will see patient in am.  Please call me for questions.  [phone]

## 2017-12-06 NOTE — Consult Note (Signed)
Physical Medicine and Rehabilitation Consult   Reason for Consult: Polytrauma Referring Physician: Dr.    HPI: Willie Blevins is a 42 y.o. male with history of emphysema who was admitted on 11/29/2017 after being struck by a tree at work. History taken from chart review and wife. 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  CT head reviewed, unremarkable for acute intracranial process. He was intubated in ED and taken to the OR for repair of lacerations as well as IM nailing left femoral shaft and I&D open fibular fracture with placement of wound VAC.  Dr. Donell Beers consulted for input on facial fractures and felt that patient would likely require an MMT as well as tracheostomy.  He was taken back to the OR on 10/17 for ORIF mandibular fracture with tracheostomy by Dr. Jearld Fenton and dressing change with I&D with excision of muscle and fat right lower extremity by Dr. Carola Frost. Post op with increase in oral secretions with fevers and treated with IV antibiotics for aspiration PNA.   He was taken to the OR on 10/21 for right canal tympanomastoidectomy with exploration and  partial decompression of right facial nerve, removal of fracture/dislocated incus from middle fossa and repair of CSF leak by Dr. Dorma Russell. He is to be PWB LLE and WBAT on RLE with Cam boot and h recommendations to continue range of motion bilateral lower extremities.  Rhabdomyolysis due to crush injuries treated with hydration and currently on tube feeds for nutritional support.  He had difficulty with vent wean Friday due  to mucous plugging. Propofol d/c this am and started on weaning today.  PT evaluation initiated yesterday and CIR recommended due to significant deficits.    Review of Systems  Unable to perform ROS: Medical condition     Past Medical History:  Diagnosis Date  . Emphysema lung (HCC)   . Open right fibular fracture 12/02/2017    Past Surgical History:  Procedure Laterality Date  . APPLICATION OF WOUND VAC Right 11/29/2017   Procedure: APPLICATION OF WOUND VAC;  Surgeon: Myrene Galas, MD;  Location: [facility] OR;  Service: Orthopedics;  Laterality: Right;  . FACIAL LACERATION REPAIR Left 11/29/2017   Procedure: ear LACERATION REPAIR;  Surgeon: Violeta Gelinas, MD;  Location: [facility] OR;  Service: General;  Laterality: Left;  . I&D EXTREMITY Right 11/29/2017   Procedure: IRRIGATION AND DEBRIDEMENT EXTREMITY;  Surgeon: Myrene Galas, MD;  Location: [facility] OR;  Service: Orthopedics;  Laterality: Right;  . I&D EXTREMITY Right 12/01/2017   Procedure: IRRIGATION AND DEBRIDEMENT RIGHT LOWER EXTREMITY AND VAC CHANGE;  Surgeon: Myrene Galas, MD;  Location: [facility] OR;  Service: Orthopedics;  Laterality: Right;  . INTRAMEDULLARY (IM) NAIL INTERTROCHANTERIC Left 11/29/2017   Procedure: INTRAMEDULLARY (IM) NAIL INTERTROCHANTRIC;  Surgeon: Myrene Galas, MD;  Location: [facility] OR;  Service: Orthopedics;  Laterality: Left;  . ORIF MANDIBULAR FRACTURE N/A 12/01/2017   Procedure: OPEN REDUCTION INTERNAL FIXATION (ORIF) MANDIBULAR FRACTURE;  Surgeon: Suzanna Obey, MD;  Location: [facility] OR;  Service: ENT;  Laterality: N/A;  . TRACHEOSTOMY TUBE PLACEMENT N/A 12/01/2017   Procedure: TRACHEOSTOMY;  Surgeon: Suzanna Obey, MD;  Location: [facility] OR;  Service: ENT;  Laterality: N/A;     Family History  Problem Relation Age of Onset  . Diabetes Mother   . Bell's palsy Mother   . Diabetes Maternal Uncle   . Diabetes Paternal Uncle     Social History:  Patient vented and no family in room. Per records review--has 54 pack year smoking  history. Quit Jan 2019. History of ETOH use. No drug history on file.     Allergies  Allergen Reactions  . Penicillins Hives    Has patient had a PCN reaction causing immediate rash, facial/tongue/throat swelling, SOB or lightheadedness with hypotension: Yes Has patient had a PCN reaction causing severe rash involving mucus membranes or skin necrosis: No Has patient had a PCN reaction that required hospitalization: No Has patient had a PCN reaction occurring within the last 10 years: No If all of the above answers are "NO", then may proceed with Cephalosporin use.     No medications prior to admission.    Home: Home Living Family/patient expects to be discharged to:: Inpatient rehab Living Arrangements: Spouse/significant other, Children Available Help at Discharge: Family Type of Home: House Home Access: Stairs to enter Secretary/administrator of Steps: 3 Entrance Stairs-Rails: None Home Layout: One level Bathroom Shower/Tub: Armed forces operational officer Accessibility: No  Functional History: Prior Function Level of Independence: Independent Functional Status:  Mobility: Bed Mobility Overal bed mobility: Needs Assistance Bed Mobility: Rolling Rolling: +2 for physical assistance, Max assist General bed mobility comments: audiologist came to evaluate pt's hearing prior to surgery this morning limiting ability to get upt to EOB Transfers General transfer comment: unable this date      ADL:    Cognition: Cognition Overall Cognitive Status: Within Functional Limits for tasks assessed Orientation Level: Intubated/Tracheostomy - Unable to assess Cognition Arousal/Alertness: Awake/alert Behavior During Therapy: Anxious Overall Cognitive Status: Within Functional Limits for tasks assessed Area of Impairment: Orientation, Attention, Memory, Following commands, Safety/judgement, Awareness, Problem solving Current Attention Level:  Selective Following Commands: Follows one step commands consistently, Follows one step commands with increased time, Follows multi-step commands with increased time Awareness: Emergent Problem Solving: Requires verbal cues, Requires tactile cues General Comments: pt extremely anxious regarding movement due to pain. pt agreeable with max encouragement however pt wrote on paper several time "just put me to sleep" wife present to help motivate   Blood pressure 115/86, pulse (!) 104, temperature 97.8 F (36.6 C), temperature source Axillary, resp. rate (!) 24, height 6\' 2"  (1.88 m), weight 113.7 kg, SpO2 92 %. Physical Exam  Nursing note and vitals reviewed. Constitutional: He appears well-developed and well-nourished.  HENT:  Facial edema and abrasions  Eyes: Right eye exhibits no discharge. Left eye exhibits no discharge.  Facial edema with periorbital ecchymosis. Eyes closed  Neck:  Trach in place  Cardiovascular: Normal rate and regular rhythm.  Respiratory: Effort normal.  Upper airway sounds  GI: Soft. Bowel sounds are normal.  Musculoskeletal:  LE edema  Neurological: He is alert.  Nonverbal Significant apraxia, limiting MMT, however, RUE/RLE 1+/5 proximal to distal LUE: 1/5 proximal to distal LLE: 0/5 proximal to distal  Skin:  See above BLE with dressing c/d/i  Psychiatric:  Unable to assess due to mentation    Results for orders placed or performed during the hospital encounter of 11/29/17 (from the past 24 hour(s))  Basic metabolic panel     Status: Abnormal   Collection Time: 12/05/17  9:04 AM  Result Value Ref Range   Sodium 127 (L) 135 -  145 mmol/L   Potassium 3.9 3.5 - 5.1 mmol/L   Chloride 94 (L) 98 - 111 mmol/L   CO2 24 22 - 32 mmol/L   Glucose, Bld 110 (H) 70 - 99 mg/dL   BUN 12 6 - 20 mg/dL   Creatinine, Ser 1.61 (L) 0.61 - 1.24 mg/dL   Calcium 7.8 (L) 8.9 - 10.3 mg/dL   GFR calc non Af Amer >60 >60 mL/min   GFR calc Af Amer >60 >60 mL/min   Anion gap  9 5 - 15  CBC     Status: Abnormal   Collection Time: 12/05/17  9:04 AM  Result Value Ref Range   WBC 16.2 (H) 4.0 - 10.5 K/uL   RBC 3.44 (L) 4.22 - 5.81 MIL/uL   Hemoglobin 10.2 (L) 13.0 - 17.0 g/dL   HCT 09.6 (L) 04.5 - 40.9 %   MCV 89.2 80.0 - 100.0 fL   MCH 29.7 26.0 - 34.0 pg   MCHC 33.2 30.0 - 36.0 g/dL   RDW 81.1 91.4 - 78.2 %   Platelets 295 150 - 400 K/uL   nRBC 0.2 0.0 - 0.2 %  Surgical pcr screen     Status: None   Collection Time: 12/05/17  9:41 AM  Result Value Ref Range   MRSA, PCR NEGATIVE NEGATIVE   Staphylococcus aureus NEGATIVE NEGATIVE  Vancomycin, trough     Status: Abnormal   Collection Time: 12/05/17 11:05 AM  Result Value Ref Range   Vancomycin Tr 7 (L) 15 - 20 ug/mL  Glucose, capillary     Status: Abnormal   Collection Time: 12/05/17  5:04 PM  Result Value Ref Range   Glucose-Capillary 123 (H) 70 - 99 mg/dL   Comment 1 Notify RN    Comment 2 Document in Chart   Triglycerides     Status: None   Collection Time: 12/05/17  6:20 PM  Result Value Ref Range   Triglycerides 146 <150 mg/dL  Glucose, capillary     Status: Abnormal   Collection Time: 12/05/17  8:01 PM  Result Value Ref Range   Glucose-Capillary 126 (H) 70 - 99 mg/dL  Glucose, capillary     Status: Abnormal   Collection Time: 12/05/17 11:24 PM  Result Value Ref Range   Glucose-Capillary 136 (H) 70 - 99 mg/dL  Glucose, capillary     Status: Abnormal   Collection Time: 12/06/17  3:49 AM  Result Value Ref Range   Glucose-Capillary 126 (H) 70 - 99 mg/dL  CBC with Differential/Platelet     Status: Abnormal   Collection Time: 12/06/17  4:21 AM  Result Value Ref Range   WBC 10.9 (H) 4.0 - 10.5 K/uL   RBC 3.26 (L) 4.22 - 5.81 MIL/uL   Hemoglobin 9.5 (L) 13.0 - 17.0 g/dL   HCT 95.6 (L) 21.3 - 08.6 %   MCV 88.7 80.0 - 100.0 fL   MCH 29.1 26.0 - 34.0 pg   MCHC 32.9 30.0 - 36.0 g/dL   RDW 57.8 46.9 - 62.9 %   Platelets 295 150 - 400 K/uL   nRBC 0.3 (H) 0.0 - 0.2 %   Neutrophils Relative % 82  %   Neutro Abs 9.0 (H) 1.7 - 7.7 K/uL   Lymphocytes Relative 8 %   Lymphs Abs 0.8 0.7 - 4.0 K/uL   Monocytes Relative 8 %   Monocytes Absolute 0.8 0.1 - 1.0 K/uL   Eosinophils Relative 0 %   Eosinophils Absolute 0.0 0.0 - 0.5 K/uL  Basophils Relative 0 %   Basophils Absolute 0.0 0.0 - 0.1 K/uL   Immature Granulocytes 2 %   Abs Immature Granulocytes 0.22 (H) 0.00 - 0.07 K/uL  Basic metabolic panel     Status: Abnormal   Collection Time: 12/06/17  4:21 AM  Result Value Ref Range   Sodium 128 (L) 135 - 145 mmol/L   Potassium 4.5 3.5 - 5.1 mmol/L   Chloride 97 (L) 98 - 111 mmol/L   CO2 22 22 - 32 mmol/L   Glucose, Bld 123 (H) 70 - 99 mg/dL   BUN 11 6 - 20 mg/dL   Creatinine, Ser 1.61 0.61 - 1.24 mg/dL   Calcium 7.9 (L) 8.9 - 10.3 mg/dL   GFR calc non Af Amer >60 >60 mL/min   GFR calc Af Amer >60 >60 mL/min   Anion gap 9 5 - 15  Glucose, capillary     Status: Abnormal   Collection Time: 12/06/17  8:08 AM  Result Value Ref Range   Glucose-Capillary 116 (H) 70 - 99 mg/dL   Comment 1 Notify RN    Comment 2 Document in Chart    No results found.  Assessment/Plan: Diagnosis: TBI with polytrauma Labs and images (see above) independently reviewed.  Records reviewed and summated above.  [place] score:  ?IV  Speech to evaluate for Post traumatic amnesia and interval GOAT scores to assess progress.  NeuroPsych evaluation for behavorial assessment.  Provide environmental management by reducing the level of stimulation, tolerating restlessness when possible, protecting patient from harming self or others and reducing patient's cognitive confusion.  Address behavioral concerns include providing structured environments and daily routines.  Cognitive therapy to direct modular abilities in order to maintain goals  including problem solving, self regulation/monitoring, self management, attention, and memory.  Fall precautions; pt at risk for second impact syndrome  Prevention of  secondary injury: monitor for hypotension, hypoxia, seizures or signs of increased ICP  Prophylactic AED:   Consider pharmacological intervention if necessary with neurostimulants,  Such as amantadine, methylphenidate, modafinil, etc.  Consider Propranolol for agitation and storming  Avoid medications that could impair cognitive abilities, such as anticholinergics, antihistaminic, benzodiazapines, narcotics, etc when possible  1. Does the need for close, 24 hr/day medical supervision in concert with the patient's rehab needs make it unreasonable for this patient to be served in a less intensive setting? Yes  2. Co-Morbidities requiring supervision/potential complications: emphysema (monitor RR and O2 sats with increased exertion, wean trach as tolerated), hyperglycemia (Monitor in accordance with exercise and adjust meds as necessary), ID (wean IV as appropriate, including IV Vanc), post-op pain (Biofeedback training with therapies to help reduce reliance on opiate pain medications, particularly IV fentanyl and dilaudid monitor pain control during therapies, and sedation at rest and titrate to maximum efficacy to ensure participation and gains in therapies), wean sedative meds as tolerated, hyponatremia (serial labs, treat as necessary) 3. Due to bladder management, bowel management, safety, skin/wound care, disease management, medication administration, pain management and patient education, does the patient require 24 hr/day rehab nursing? Yes 4. Does the patient require coordinated care of a physician, rehab nurse, PT (1-2 hrs/day, 5 days/week), OT (1-2 hrs/day, 5 days/week) and SLP (1-2 hrs/day, 5 days/week) to address physical and functional deficits in the context of the above medical diagnosis(es)? Yes Addressing deficits in the following areas: balance, endurance, locomotion, strength, transferring, bowel/bladder control, bathing, dressing, feeding, grooming, toileting, cognition, speech, language,  swallowing and psychosocial support 5. Can the patient  actively participate in an intensive therapy program of at least 3 hrs of therapy per day at least 5 days per week? In the future 6. The potential for patient to make measurable gains while on inpatient rehab is excellent 7. Anticipated functional outcomes upon discharge from inpatient rehab are min assist  with PT, min assist with OT, min assist with SLP. 8. Estimated rehab length of stay to reach the above functional goals is: 30-34 days. 9. Anticipated D/C setting: Home 10. Anticipated post D/C treatments: HH therapy and Home excercise program 11. Overall Rehab/Functional Prognosis: good  RECOMMENDATIONS: This patient's condition is appropriate for continued rehabilitative care in the following setting: CIR when medically stable and able to tolerate 3 hours of therapy/day. Patient has agreed to participate in recommended program. Potentially Note that insurance prior authorization may be required for reimbursement for recommended care.  Comment: Rehab Admissions Coordinator to follow up.   I have personally performed a face to face diagnostic evaluation, including, but not limited to relevant history and physical exam findings, of this patient and developed relevant assessment and plan.  Additionally, I have reviewed and concur with the physician assistant's documentation above.   Maryla Morrow, MD, ABPMR Jacquelynn Cree, PA-C 12/06/2017

## 2017-12-06 NOTE — Progress Notes (Addendum)
Placed pt on SBT trial Cpap/PSV 5/5, 50%. RT notified Dr Janee Morn in regards to SBT trial Cpap/psv 5/5, 50%. Per MD ok to wean as tol on these vent settings RT will continue to follow.

## 2017-12-06 NOTE — Progress Notes (Signed)
Patient on trach collar trial with RN at bedside. Patient is tolerating well. Will continue to monitor.
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
## 2017-12-06 NOTE — Progress Notes (Signed)
Faxed updated clinical information to Ileana Roup, pt's worker's compensation Case Production designer, theatre/television/film.    Quintella Baton, RN, BSN  Trauma/Neuro ICU Case Manager [phone]

## 2017-12-07 ENCOUNTER — Inpatient Hospital Stay (HOSPITAL_COMMUNITY): Payer: Worker's Compensation

## 2017-12-07 LAB — BASIC METABOLIC PANEL
Anion gap: 6 (ref 5–15)
BUN: 12 mg/dL (ref 6–20)
CALCIUM: 7.8 mg/dL — AB (ref 8.9–10.3)
CO2: 26 mmol/L (ref 22–32)
Chloride: 104 mmol/L (ref 98–111)
Creatinine, Ser: 0.64 mg/dL (ref 0.61–1.24)
GFR calc non Af Amer: >60 mL/min (ref >60)
Glucose, Bld: 87 mg/dL (ref 70–99)
POTASSIUM: 4.8 mmol/L (ref 3.5–5.1)
SODIUM: 136 mmol/L (ref 135–145)

## 2017-12-07 LAB — CBC
HCT: 29.7 % — ABNORMAL LOW (ref 39.0–52.0)
Hemoglobin: 9.2 g/dL — ABNORMAL LOW (ref 13.0–17.0)
MCH: 28.8 pg (ref 26.0–34.0)
MCHC: 31 g/dL (ref 30.0–36.0)
MCV: 92.8 fL (ref 80.0–100.0)
NRBC: 0.6 % — AB (ref 0.0–0.2)
PLATELETS: 229 10*3/uL (ref 150–400)
RBC: 3.2 MIL/uL — ABNORMAL LOW (ref 4.22–5.81)
RDW: 14.5 % (ref 11.5–15.5)
WBC: 12.3 10*3/uL — ABNORMAL HIGH (ref 4.0–10.5)

## 2017-12-07 LAB — GLUCOSE, CAPILLARY
Glucose-Capillary: 90 mg/dL (ref 70–99)
Glucose-Capillary: 93 mg/dL (ref 70–99)
Glucose-Capillary: 101 mg/dL — ABNORMAL HIGH (ref 70–99)
Glucose-Capillary: 117 mg/dL — ABNORMAL HIGH (ref 70–99)
Glucose-Capillary: 94 mg/dL (ref 70–99)

## 2017-12-07 MED ORDER — OXYCODONE HCL 5 MG/5ML PO SOLN
5.0000 mg | ORAL | Status: DC | PRN
  Administered 2017-12-07 – 2017-12-13 (×12): 10 mg
  Filled 2017-12-07 (×13): qty 10

## 2017-12-07 MED ORDER — METHOCARBAMOL 500 MG PO TABS
1000.0000 mg | ORAL_TABLET | Freq: Three times a day (TID) | ORAL | Status: DC
  Administered 2017-12-07 – 2017-12-13 (×14): 1000 mg
  Filled 2017-12-07 (×15): qty 2

## 2017-12-07 MED ORDER — ENOXAPARIN SODIUM 40 MG/0.4ML ~~LOC~~ SOLN
40.0000 mg | SUBCUTANEOUS | Status: DC
  Administered 2017-12-07 – 2017-12-13 (×7): 40 mg via SUBCUTANEOUS
  Filled 2017-12-07 (×7): qty 0.4

## 2017-12-07 MED ORDER — ACETAMINOPHEN 160 MG/5ML PO SOLN
1000.0000 mg | Freq: Three times a day (TID) | ORAL | Status: DC
  Administered 2017-12-07 – 2017-12-13 (×10): 1000 mg
  Filled 2017-12-07 (×12): qty 40.6
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
## 2017-12-07 NOTE — Anesthesia Postprocedure Evaluation (Signed)
Anesthesia Post Note  Patient: Philemon R [name]  Procedure(s) Performed: OPEN REDUCTION INTERNAL FIXATION (ORIF) MANDIBULAR FRACTURE (N/A Face) TRACHEOSTOMY (N/A Neck) IRRIGATION AND DEBRIDEMENT RIGHT LOWER EXTREMITY AND VAC CHANGE (Right )     Patient location during evaluation: ICU Anesthesia Type: General Level of consciousness: patient remains intubated per anesthesia plan Pain management: pain level controlled Vital Signs Assessment: post-procedure vital signs reviewed and stable Respiratory status: patient on ventilator - see flowsheet for VS Cardiovascular status: stable Postop Assessment: no apparent nausea or vomiting Anesthetic complications: no    Last Vitals:  Vitals:   12/07/17 0800 12/07/17 0830  BP:  133/82  Pulse:  (!) 109  Resp:  (!) 24  Temp: 37.1 C   SpO2:      Last Pain:  Vitals:   12/07/17 1024  TempSrc:   PainSc: 3    Pain Goal:                 Caren Macadam

## 2017-12-07 NOTE — Progress Notes (Signed)
IP rehab admissions - I met briefly with patient's wife at the bedside.  I explained my role and I gave her rehab brochures.  I will follow along for progress.  Call me for questions.  [phone]
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
## 2017-12-07 NOTE — Procedures (Signed)
Cortrak  Person Inserting Tube:  Mahala Menghini, RD Tube Type:  Cortrak - 43 inches Tube Location:  Left nare Initial Placement:  Stomach Secured by: Tape Technique Used to Measure Tube Placement:  Documented cm marking at nare/ corner of mouth Cortrak Secured At:  57 cm    Cortrak Tube Team Note:  Consult received to replace a Cortrak feeding tube. Utilized existing tube from previous Cortrak placement.  X-ray is required, abdominal x-ray has been ordered by the Cortrak team. Please confirm tube placement before using the Cortrak tube.  If the tube becomes dislodged please keep the tube and contact the Cortrak team at [email or web address] (password [email or web address]) for replacement.  If after hours and replacement cannot be delayed, place a NG tube and confirm placement with an abdominal x-ray.    Earma Reading, MS, RD, LDN Inpatient Clinical Dietitian Pager: [phone] Weekend/After Hours: [phone]

## 2017-12-07 NOTE — Plan of Care (Signed)
  Problem: Education: Goal: Knowledge of General Education information will improve Description Including pain rating scale, medication(s)/side effects and non-pharmacologic comfort measures Outcome: Progressing   Problem: Clinical Measurements: Goal: Ability to maintain clinical measurements within normal limits will improve Outcome: Progressing Goal: Diagnostic test results will improve Outcome: Progressing Goal: Cardiovascular complication will be avoided Outcome: Progressing   Problem: Activity: Goal: Risk for activity intolerance will decrease Outcome: Progressing   Problem: Nutrition: Goal: Adequate nutrition will be maintained Outcome: Progressing   Problem: Skin Integrity: Goal: Risk for impaired skin integrity will decrease Outcome: Progressing   Problem: Respiratory: Goal: Ability to maintain a clear airway and adequate ventilation will improve Outcome: Progressing Note:  Trach cuff deflated today
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
## 2017-12-07 NOTE — Progress Notes (Signed)
Trauma Service Note  Subjective: Patient up in chair, no distress.  Sleeping at this time, but apparently has been requiring more medications.  Objective: Vital signs in last 24 hours: Temp:  [98.1 F (36.7 C)-99.8 F (37.7 C)] 98.8 F (37.1 C) (10/23 0800) Pulse Rate:  [91-136] 109 (10/23 0830) Resp:  [14-32] 24 (10/23 0830) BP: (114-151)/(64-96) 133/82 (10/23 0830) SpO2:  [95 %-100 %] 100 % (10/23 0700) FiO2 (%):  [28 %-40 %] 28 % (10/23 0830) Weight:  [113.9 kg] 113.9 kg (10/23 0440) Last BM Date: 12/05/17  Intake/Output from previous day: 10/22 0701 - 10/23 0700 In: 1722 [I.V.:1572; IV Piggyback:150] Out: 5485 [Urine:5485] Intake/Output this shift: No intake/output data recorded.  General: No distress.  More pain  Lungs: Clear  Abd: Soft, benign  Extremities: No changes  Neuro: Intact  Lab Results: CBC  Recent Labs    12/06/17 0421 12/07/17 0441  WBC 10.9* 12.3*  HGB 9.5* 9.2*  HCT 28.9* 29.7*  PLT 295 229   BMET Recent Labs    12/06/17 0421 12/07/17 0814  NA 128* 136  K 4.5 4.8  CL 97* 104  CO2 22 26  GLUCOSE 123* 87  BUN 11 12  CREATININE 0.61 0.64  CALCIUM 7.9* 7.8*   PT/INR No results for input(s): LABPROT, INR in the last 72 hours. ABG No results for input(s): PHART, HCO3 in the last 72 hours.  Invalid input(s): PCO2, PO2  Studies/Results: Dg Abd Portable 1v  Result Date: 12/07/2017 CLINICAL DATA:  Feeding tube placement. EXAM: PORTABLE ABDOMEN - 1 VIEW COMPARISON:  12/02/2017. FINDINGS: Feeding tube again noted with tip projected over the upper stomach. Heart size normal. Bibasilar atelectasis and infiltrates. Small left pleural effusion. IMPRESSION: Feeding tube again noted with tip projected over the upper stomach. 2. Bibasilar atelectasis and infiltrates. Small left pleural effusion. Electronically Signed   By: Maisie Fus  Register   On: 12/07/2017 10:57    Anti-infectives: Anti-infectives (From admission, onward)   Start      Dose/Rate Route Frequency Ordered Stop   12/06/17 2000  ceFEPIme (MAXIPIME) 2 g in sodium chloride 0.9 % 100 mL IVPB  Status:  Discontinued     2 g 200 mL/hr over 30 Minutes Intravenous Every 12 hours 12/05/17 1841 12/06/17 1010   12/06/17 1100  cefTRIAXone (ROCEPHIN) 2 g in sodium chloride 0.9 % 100 mL IVPB     2 g 200 mL/hr over 30 Minutes Intravenous Every 24 hours 12/06/17 1010     12/05/17 1731  ceFEPIme (MAXIPIME) 2 g in sodium chloride 0.9 % 100 mL IVPB  Status:  Discontinued     2 g 200 mL/hr over 30 Minutes Intravenous Every 12 hours 12/05/17 1732 12/05/17 1841   12/05/17 1730  vancomycin (VANCOCIN) 1,250 mg in sodium chloride 0.9 % 250 mL IVPB  Status:  Discontinued     1,250 mg 166.7 mL/hr over 90 Minutes Intravenous Every 8 hours 12/05/17 1706 12/06/17 0954   12/05/17 1555  polymyxin B 500,000 Units, bacitracin 50,000 Units in sodium chloride 0.9 % 500 mL irrigation  Status:  Discontinued       As needed 12/05/17 1555 12/05/17 1647   12/05/17 1330  vancomycin (VANCOCIN) IVPB 1000 mg/200 mL premix     1,000 mg 200 mL/hr over 60 Minutes Intravenous To Surgery 12/05/17 1318 12/05/17 1420   12/03/17 1200  vancomycin (VANCOCIN) IVPB 1000 mg/200 mL premix  Status:  Discontinued     1,000 mg 200 mL/hr over 60 Minutes Intravenous Every  8 hours 12/03/17 1050 12/05/17 1706   12/03/17 1200  ceFEPIme (MAXIPIME) 2 g in sodium chloride 0.9 % 100 mL IVPB  Status:  Discontinued     2 g 200 mL/hr over 30 Minutes Intravenous Every 12 hours 12/03/17 1050 12/05/17 1732   12/01/17 1032  tobramycin (NEBCIN) powder  Status:  Discontinued       As needed 12/01/17 1032 12/01/17 1105   12/01/17 1031  vancomycin (VANCOCIN) powder  Status:  Discontinued       As needed 12/01/17 1031 12/01/17 1105   11/29/17 2000  ceFAZolin (ANCEF) IVPB 2g/100 mL premix     2 g 200 mL/hr over 30 Minutes Intravenous Every 8 hours 11/29/17 1617 12/01/17 1326   11/29/17 1334  vancomycin (VANCOCIN) powder  Status:   Discontinued       As needed 11/29/17 1335 11/29/17 1547   11/29/17 1327  tobramycin (NEBCIN) powder  Status:  Discontinued       As needed 11/29/17 1334 11/29/17 1547      Assessment/Plan: s/p Procedure(s): right canal up tympanomastoid, right mastoidectomy, exploration right facial nerve, Type 1 tympanoplasty, CSF leak repair. ST evaluation and swallowing studies  Keep off ventilator Augment pain coverage.  LOS: 8 days   Marta Lamas. Gae Bon, MD, FACS [phone] Trauma Surgeon 12/07/2017

## 2017-12-08 LAB — GLUCOSE, CAPILLARY
GLUCOSE-CAPILLARY: 123 mg/dL — AB (ref 70–99)
GLUCOSE-CAPILLARY: 109 mg/dL — AB (ref 70–99)
GLUCOSE-CAPILLARY: 118 mg/dL — AB (ref 70–99)
GLUCOSE-CAPILLARY: 115 mg/dL — AB (ref 70–99)
Glucose-Capillary: 110 mg/dL — ABNORMAL HIGH (ref 70–99)
Glucose-Capillary: 128 mg/dL — ABNORMAL HIGH (ref 70–99)
Glucose-Capillary: 93 mg/dL (ref 70–99)

## 2017-12-08 LAB — CULTURE, BLOOD (ROUTINE X 2)
CULTURE: NO GROWTH
Culture: NO GROWTH
SPECIAL REQUESTS: ADEQUATE
Special Requests: ADEQUATE

## 2017-12-08 LAB — CBC
HCT: 29.8 % — ABNORMAL LOW (ref 39.0–52.0)
Hemoglobin: 9.4 g/dL — ABNORMAL LOW (ref 13.0–17.0)
MCH: 28.8 pg (ref 26.0–34.0)
MCHC: 31.5 g/dL (ref 30.0–36.0)
MCV: 91.4 fL (ref 80.0–100.0)
PLATELETS: 424 10*3/uL — AB (ref 150–400)
RBC: 3.26 MIL/uL — AB (ref 4.22–5.81)
RDW: 14.2 % (ref 11.5–15.5)
WBC: 15.8 10*3/uL — ABNORMAL HIGH (ref 4.0–10.5)
nRBC: 1.1 % — ABNORMAL HIGH (ref 0.0–0.2)

## 2017-12-08 MED ORDER — GABAPENTIN 250 MG/5ML PO SOLN
400.0000 mg | Freq: Three times a day (TID) | ORAL | Status: DC
  Administered 2017-12-08 – 2017-12-13 (×8): 400 mg
  Filled 2017-12-08 (×20): qty 8

## 2017-12-08 MED ORDER — PANTOPRAZOLE SODIUM 40 MG PO PACK
40.0000 mg | PACK | Freq: Every day | ORAL | Status: DC
  Administered 2017-12-09 – 2017-12-13 (×3): 40 mg
  Filled 2017-12-08 (×4): qty 20

## 2017-12-08 NOTE — Progress Notes (Signed)
Patient ID: Willie Blevins, male   DOB: 03-10-1975, 42 y.o.   MRN: [id number] I met with his parents at the bedside and updated them on his current status and the plan of care. I answered their questions.  Georganna Skeans, MD, MPH, FACS Trauma: [phone] General Surgery: [phone]
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
## 2017-12-09 ENCOUNTER — Inpatient Hospital Stay (HOSPITAL_COMMUNITY): Payer: Worker's Compensation

## 2017-12-09 LAB — GLUCOSE, CAPILLARY
GLUCOSE-CAPILLARY: 110 mg/dL — AB (ref 70–99)
Glucose-Capillary: 108 mg/dL — ABNORMAL HIGH (ref 70–99)
Glucose-Capillary: 89 mg/dL (ref 70–99)
Glucose-Capillary: 90 mg/dL (ref 70–99)
Glucose-Capillary: 93 mg/dL (ref 70–99)

## 2017-12-09 MED ORDER — KETOROLAC TROMETHAMINE 15 MG/ML IJ SOLN
15.0000 mg | Freq: Four times a day (QID) | INTRAMUSCULAR | Status: AC
  Administered 2017-12-09 – 2017-12-11 (×8): 15 mg via INTRAVENOUS
  Filled 2017-12-09 (×8): qty 1

## 2017-12-09 MED ORDER — KETOROLAC TROMETHAMINE 30 MG/ML IJ SOLN
30.0000 mg | Freq: Once | INTRAMUSCULAR | Status: AC
  Administered 2017-12-09: 30 mg via INTRAVENOUS
  Filled 2017-12-09: qty 1

## 2017-12-09 MED ORDER — CHLORHEXIDINE GLUCONATE 0.12 % MT SOLN
OROMUCOSAL | Status: AC
  Filled 2017-12-09: qty 15

## 2017-12-09 NOTE — Progress Notes (Signed)
Trauma Service Note  Subjective: Patient able to talk with  Me with PM valve in place.  Complaining of weakness of he left leg.  Lots of swelling in the LLE  Objective: Vital signs in last 24 hours: Temp:  [99.2 F (37.3 C)-101 F (38.3 C)] 99.5 F (37.5 C) (10/25 0359) Pulse Rate:  [87-126] 90 (10/25 0800) Resp:  [19-28] 20 (10/25 0743) BP: (94-141)/(57-85) 123/75 (10/25 0800) SpO2:  [96 %-100 %] 100 % (10/25 0800) FiO2 (%):  [28 %] 28 % (10/25 0743) Last BM Date: 12/08/17  Intake/Output from previous day: 10/24 0701 - 10/25 0700 In: 1273.7 [I.V.:403.7; NG/GT:770; IV Piggyback:100] Out: 1825 [Urine:1825] Intake/Output this shift: Total I/O In: 159.8 [I.V.:19.8; NG/GT:140] Out: -   General: Pain with coughing.  Lungs: Clear to auscultation  Abd: Benign  Extremities: LLE swelling with ,  But able to weight.  Neuro: Intact  Lab Results: CBC  Recent Labs    12/07/17 0441 12/08/17 0447  WBC 12.3* 15.8*  HGB 9.2* 9.4*  HCT 29.7* 29.8*  PLT 229 424*   BMET Recent Labs    12/07/17 0814  NA 136  K 4.8  CL 104  CO2 26  GLUCOSE 87  BUN 12  CREATININE 0.64  CALCIUM 7.8*   PT/INR No results for input(s): LABPROT, INR in the last 72 hours. ABG No results for input(s): PHART, HCO3 in the last 72 hours.  Invalid input(s): PCO2, PO2  Studies/Results: Dg Abd Portable 1v  Result Date: 12/07/2017 CLINICAL DATA:  Feeding tube placement. EXAM: PORTABLE ABDOMEN - 1 VIEW COMPARISON:  12/02/2017. FINDINGS: Feeding tube again noted with tip projected over the upper stomach. Heart size normal. Bibasilar atelectasis and infiltrates. Small left pleural effusion. IMPRESSION: Feeding tube again noted with tip projected over the upper stomach. 2. Bibasilar atelectasis and infiltrates. Small left pleural effusion. Electronically Signed   By: Maisie Fus  Register   On: 12/07/2017 10:57    Anti-infectives: Anti-infectives (From admission, onward)   Start     Dose/Rate Route  Frequency Ordered Stop   12/06/17 2000  ceFEPIme (MAXIPIME) 2 g in sodium chloride 0.9 % 100 mL IVPB  Status:  Discontinued     2 g 200 mL/hr over 30 Minutes Intravenous Every 12 hours 12/05/17 1841 12/06/17 1010   12/06/17 1100  cefTRIAXone (ROCEPHIN) 2 g in sodium chloride 0.9 % 100 mL IVPB     2 g 200 mL/hr over 30 Minutes Intravenous Every 24 hours 12/06/17 1010     12/05/17 1731  ceFEPIme (MAXIPIME) 2 g in sodium chloride 0.9 % 100 mL IVPB  Status:  Discontinued     2 g 200 mL/hr over 30 Minutes Intravenous Every 12 hours 12/05/17 1732 12/05/17 1841   12/05/17 1730  vancomycin (VANCOCIN) 1,250 mg in sodium chloride 0.9 % 250 mL IVPB  Status:  Discontinued     1,250 mg 166.7 mL/hr over 90 Minutes Intravenous Every 8 hours 12/05/17 1706 12/06/17 0954   12/05/17 1555  polymyxin B 500,000 Units, bacitracin 50,000 Units in sodium chloride 0.9 % 500 mL irrigation  Status:  Discontinued       As needed 12/05/17 1555 12/05/17 1647   12/05/17 1330  vancomycin (VANCOCIN) IVPB 1000 mg/200 mL premix     1,000 mg 200 mL/hr over 60 Minutes Intravenous To Surgery 12/05/17 1318 12/05/17 1420   12/03/17 1200  vancomycin (VANCOCIN) IVPB 1000 mg/200 mL premix  Status:  Discontinued     1,000 mg 200 mL/hr over 60 Minutes  Intravenous Every 8 hours 12/03/17 1050 12/05/17 1706   12/03/17 1200  ceFEPIme (MAXIPIME) 2 g in sodium chloride 0.9 % 100 mL IVPB  Status:  Discontinued     2 g 200 mL/hr over 30 Minutes Intravenous Every 12 hours 12/03/17 1050 12/05/17 1732   12/01/17 1032  tobramycin (NEBCIN) powder  Status:  Discontinued       As needed 12/01/17 1032 12/01/17 1105   12/01/17 1031  vancomycin (VANCOCIN) powder  Status:  Discontinued       As needed 12/01/17 1031 12/01/17 1105   11/29/17 2000  ceFAZolin (ANCEF) IVPB 2g/100 mL premix     2 g 200 mL/hr over 30 Minutes Intravenous Every 8 hours 11/29/17 1617 12/01/17 1326   11/29/17 1334  vancomycin (VANCOCIN) powder  Status:  Discontinued       As  needed 11/29/17 1335 11/29/17 1547   11/29/17 1327  tobramycin (NEBCIN) powder  Status:  Discontinued       As needed 11/29/17 1334 11/29/17 1547      Assessment/Plan: s/p Procedure(s): right canal up tympanomastoid, right mastoidectomy, exploration right facial nerve, Type 1 tympanoplasty, CSF leak repair. Advance diet Full liquids  LOS: 10 days   Marta Lamas. Gae Bon, MD, FACS [phone] Trauma Surgeon 12/09/2017

## 2017-12-09 NOTE — Progress Notes (Addendum)
Verbal by PA "okay to remove c-spine collar".

## 2017-12-09 NOTE — Plan of Care (Signed)
  Problem: Education: Goal: Knowledge of General Education information will improve Description Including pain rating scale, medication(s)/side effects and non-pharmacologic comfort measures Outcome: Progressing   Problem: Clinical Measurements: Goal: Ability to maintain clinical measurements within normal limits will improve 12/09/2017 1450 by Faylene Million, RN Outcome: Progressing 12/09/2017 0816 by Faylene Million, RN Outcome: Progressing Goal: Respiratory complications will improve 12/09/2017 1450 by Faylene Million, RN Outcome: Progressing 12/09/2017 0816 by Faylene Million, RN Outcome: Progressing   Problem: Activity: Goal: Risk for activity intolerance will decrease Outcome: Progressing   Problem: Nutrition: Goal: Adequate nutrition will be maintained 12/09/2017 1450 by Faylene Million, RN Outcome: Progressing Note:  Patient asked to drink breeze boost at own will to show he will have adequate intake once coretrak is out.  12/09/2017 0816 by Faylene Million, RN Outcome: Progressing
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
## 2017-12-09 NOTE — Plan of Care (Signed)
  Problem: Clinical Measurements: Goal: Ability to maintain clinical measurements within normal limits will improve Outcome: Progressing Goal: Respiratory complications will improve Outcome: Progressing   Problem: Nutrition: Goal: Adequate nutrition will be maintained Outcome: Progressing   
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
## 2017-12-09 NOTE — Progress Notes (Signed)
Inpatient Rehabilitation  Continuing to follow along for timing of medical readiness, therapy tolerance, insurance authorization, and IP Rehab bed availability.  Plan for my co-worker Roderic Palau to follow up Monday, 12/12/17.  Call if questions.   Charlane Ferretti., CCC/SLP Admission Coordinator  [facility]patient Rehabilitation  Cell [phone]

## 2017-12-10 ENCOUNTER — Other Ambulatory Visit: Payer: Self-pay

## 2017-12-10 ENCOUNTER — Encounter (HOSPITAL_COMMUNITY): Payer: Self-pay

## 2017-12-10 LAB — GLUCOSE, CAPILLARY
GLUCOSE-CAPILLARY: 114 mg/dL — AB (ref 70–99)
GLUCOSE-CAPILLARY: 89 mg/dL (ref 70–99)
GLUCOSE-CAPILLARY: 92 mg/dL (ref 70–99)
Glucose-Capillary: 95 mg/dL (ref 70–99)

## 2017-12-10 LAB — BASIC METABOLIC PANEL
ANION GAP: 12 (ref 5–15)
BUN: 22 mg/dL — ABNORMAL HIGH (ref 6–20)
CO2: 22 mmol/L (ref 22–32)
Calcium: 8.3 mg/dL — ABNORMAL LOW (ref 8.9–10.3)
Chloride: 102 mmol/L (ref 98–111)
Creatinine, Ser: 0.65 mg/dL (ref 0.61–1.24)
Glucose, Bld: 83 mg/dL (ref 70–99)
POTASSIUM: 4.9 mmol/L (ref 3.5–5.1)
SODIUM: 136 mmol/L (ref 135–145)

## 2017-12-10 LAB — CBC WITH DIFFERENTIAL/PLATELET
ABS IMMATURE GRANULOCYTES: 0.72 10*3/uL — AB (ref 0.00–0.07)
Basophils Absolute: 0.1 10*3/uL (ref 0.0–0.1)
Basophils Relative: 0 %
Eosinophils Absolute: 0.9 10*3/uL — ABNORMAL HIGH (ref 0.0–0.5)
Eosinophils Relative: 5 %
HEMATOCRIT: 30.6 % — AB (ref 39.0–52.0)
Hemoglobin: 9.4 g/dL — ABNORMAL LOW (ref 13.0–17.0)
IMMATURE GRANULOCYTES: 4 %
LYMPHS ABS: 2.5 10*3/uL (ref 0.7–4.0)
Lymphocytes Relative: 13 %
MCH: 28.1 pg (ref 26.0–34.0)
MCHC: 30.7 g/dL (ref 30.0–36.0)
MCV: 91.3 fL (ref 80.0–100.0)
MONO ABS: 1.5 10*3/uL — AB (ref 0.1–1.0)
MONOS PCT: 8 %
NEUTROS ABS: 14.3 10*3/uL — AB (ref 1.7–7.7)
Neutrophils Relative %: 70 %
PLATELETS: 552 10*3/uL — AB (ref 150–400)
RBC: 3.35 MIL/uL — ABNORMAL LOW (ref 4.22–5.81)
RDW: 14.8 % (ref 11.5–15.5)
WBC: 20 10*3/uL — ABNORMAL HIGH (ref 4.0–10.5)
nRBC: 0.1 % (ref 0.0–0.2)

## 2017-12-10 MED ORDER — POLYVINYL ALCOHOL 1.4 % OP SOLN
2.0000 [drp] | OPHTHALMIC | Status: DC
  Administered 2017-12-10 – 2017-12-13 (×12): 2 [drp] via OPHTHALMIC
  Filled 2017-12-10: qty 15
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
## 2017-12-11 LAB — CBC WITH DIFFERENTIAL/PLATELET
Abs Immature Granulocytes: 0.45 10*3/uL — ABNORMAL HIGH (ref 0.00–0.07)
BASOS ABS: 0 10*3/uL (ref 0.0–0.1)
Basophils Relative: 0 %
EOS PCT: 4 %
Eosinophils Absolute: 0.8 10*3/uL — ABNORMAL HIGH (ref 0.0–0.5)
HCT: 30.3 % — ABNORMAL LOW (ref 39.0–52.0)
HEMOGLOBIN: 9.3 g/dL — AB (ref 13.0–17.0)
Immature Granulocytes: 3 %
LYMPHS PCT: 17 %
Lymphs Abs: 3.1 10*3/uL (ref 0.7–4.0)
MCH: 28 pg (ref 26.0–34.0)
MCHC: 30.7 g/dL (ref 30.0–36.0)
MCV: 91.3 fL (ref 80.0–100.0)
Monocytes Absolute: 1.8 10*3/uL — ABNORMAL HIGH (ref 0.1–1.0)
Monocytes Relative: 10 %
NEUTROS ABS: 12.1 10*3/uL — AB (ref 1.7–7.7)
NRBC: 0.1 % (ref 0.0–0.2)
Neutrophils Relative %: 66 %
Platelets: 620 10*3/uL — ABNORMAL HIGH (ref 150–400)
RBC: 3.32 MIL/uL — AB (ref 4.22–5.81)
RDW: 14.6 % (ref 11.5–15.5)
WBC: 18.2 10*3/uL — AB (ref 4.0–10.5)

## 2017-12-11 LAB — GLUCOSE, CAPILLARY: GLUCOSE-CAPILLARY: 95 mg/dL (ref 70–99)

## 2017-12-11 NOTE — Progress Notes (Signed)
Orthopedic Tech Progress Note Patient Details:  Ida R [name] May 11, 1975 [id number]  Ortho Devices Ortho Device/Splint Location: trapeze bar patient helper   Post Interventions Patient Tolerated: Well Instructions Provided: Care of device   Nikki Dom 12/11/2017, 12:04 PM

## 2017-12-11 NOTE — Progress Notes (Signed)
[facility] Office:  [phone] General Surgery Progress Note   LOS: 12 days  POD -  6 Days Post-Op  Chief Complaint: Facial trauma  Assessment and Plan: 1.  L subtroch femur fx, crush injury left leg with fibula fx- s/p IMN Dr Carola Frost 10/15. Partal WB on left, WBAT Right  He complains more of leg left pain - no pain on the right.  To go to rehab ?? - timing  Will get trapeze bar to pull up 2.  R open fibula fx, crush injury right leg with extensive muscle injury - s/p I&D with wound vac application Dr Carola Frost 10/15, washout, retention suture closure and reapplication of wound vac 10/17  3.  R ear lac- repair Dr. Elwyn Lade 10/15 (absorbable sutures) 4.  L rib #11 fx 5.  Grade 2 splenic lac, Grade 1 liver lac 6.  R adrenal hemorrhage  7.  R maxillary sinus fx/ R lateral wall orbit fx/ R mastoid process fx/ Nasal septum fx - ORIF mandible fx and tracheostomy - 10/17 - Dr Jearld Fenton  Will need follow up for timing of when to remove wires/trach 8.  Skull fracture without intracranial injury 9.  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 Dorma Russell 10/21  10.  Leukocytosis  WBC - 18,200 - 12/11/2017  On Rocephin  Recheck tomorrow 11.  DVT prophylaxis - On lovenox 12.  Cortrak ordered 13.  Wife concerned about PTSD - can get psych involved during the week   Active Problems:   Closed left subtrochanteric femur fracture (HCC)   Crush injury, leg, lower, bilateral    Open right fibular fracture   Closed extensive facial fractures (HCC)   Fracture   Liver injury, laceration   Trauma   Traumatic brain injury with loss of consciousness (HCC)   Pulmonary emphysema (HCC)  Hyperglycemia   Post-op pain   Hyponatremia  Subjective:  Main problems are pain - in face/mouth, left chest, and left leg  Wife at bedside  Objective:   Vitals:   12/11/17 0837 12/11/17 0952  BP:  124/73  Pulse: 89 91  Resp: 16 20  Temp:  98.5 F (36.9 C)  SpO2: 100% 95%     Intake/Output from previous day:  10/26 0701 - 10/27 0700 In: 783.8 [P.O.:600; I.V.:83.8; IV Piggyback:100] Out: -   Intake/Output this shift:  No intake/output data recorded.   Physical Exam:   General: WN WM who is alert and oriented.    HEENT: Jaw wired.  Has trach in place. .   Lungs: Clear   Abdomen: Soft     Lab Results:    Recent Labs    12/10/17 0656 12/11/17 0249  WBC 20.0* 18.2*  HGB 9.4* 9.3*  HCT 30.6* 30.3*  PLT 552* 620*    BMET   Recent Labs    12/10/17 0656  NA 136  K 4.9  CL 102  CO2 22  GLUCOSE 83  BUN 22*  CREATININE 0.65  CALCIUM 8.3*    PT/INR  No results for input(s): LABPROT, INR in the last 72 hours.  ABG  No results for input(s): PHART, HCO3 in the last 72 hours.  Invalid input(s): PCO2, PO2   Studies/Results:  No results found.   Anti-infectives:  Anti-infectives (From admission, onward)   Start     Dose/Rate Route Frequency Ordered Stop   12/06/17 2000  ceFEPIme (MAXIPIME) 2 g in sodium chloride 0.9 % 100 mL IVPB  Status:  Discontinued     2 g 200 mL/hr over 30 Minutes Intravenous Every 12 hours 12/05/17 1841 12/06/17 1010   12/06/17 1100  cefTRIAXone (ROCEPHIN) 2 g in sodium chloride 0.9 % 100 mL IVPB     2 g 200 mL/hr over 30 Minutes Intravenous Every 24 hours 12/06/17 1010     12/05/17 1731  ceFEPIme (MAXIPIME) 2 g in sodium chloride 0.9 % 100 mL IVPB  Status:  Discontinued     2 g 200 mL/hr over 30 Minutes Intravenous Every 12 hours 12/05/17 1732 12/05/17 1841   12/05/17 1730  vancomycin (VANCOCIN) 1,250 mg in sodium chloride 0.9 % 250 mL IVPB  Status:  Discontinued     1,250 mg 166.7 mL/hr over 90 Minutes Intravenous Every 8 hours  12/05/17 1706 12/06/17 0954   12/05/17 1555  polymyxin B 500,000 Units, bacitracin 50,000 Units in sodium chloride 0.9 % 500 mL irrigation  Status:  Discontinued       As needed 12/05/17 1555 12/05/17 1647   12/05/17 1330  vancomycin (VANCOCIN) IVPB 1000 mg/200 mL premix     1,000 mg 200 mL/hr over 60 Minutes Intravenous To Surgery 12/05/17 1318 12/05/17 1420   12/03/17 1200  vancomycin (VANCOCIN) IVPB 1000 mg/200 mL premix  Status:  Discontinued     1,000 mg 200 mL/hr over 60 Minutes Intravenous Every 8 hours 12/03/17 1050 12/05/17 1706   12/03/17 1200  ceFEPIme (MAXIPIME) 2 g in sodium chloride 0.9 % 100 mL IVPB  Status:  Discontinued     2 g 200 mL/hr over 30 Minutes Intravenous Every 12 hours 12/03/17 1050 12/05/17 1732   12/01/17 1032  tobramycin (NEBCIN) powder  Status:  Discontinued       As needed 12/01/17 1032 12/01/17 1105   12/01/17 1031  vancomycin (VANCOCIN) powder  Status:  Discontinued       As needed 12/01/17 1031 12/01/17 1105   11/29/17 2000  ceFAZolin (ANCEF) IVPB 2g/100 mL premix     2 g 200 mL/hr over 30 Minutes Intravenous Every 8 hours 11/29/17 1617 12/01/17 1326   11/29/17 1334  vancomycin (VANCOCIN) powder  Status:  Discontinued       As needed 11/29/17 1335 11/29/17 1547   11/29/17 1327  tobramycin (NEBCIN) powder  Status:  Discontinued       As needed 11/29/17 1334 11/29/17 1547      Ovidio Kin, MD, FACS Pager: [phone] [facility] Office: [phone] 12/11/2017

## 2017-12-11 NOTE — Plan of Care (Signed)
  Problem: Pain Managment: Goal: General experience of comfort will improve Outcome: Progressing   Problem: Clinical Measurements: Goal: Respiratory complications will improve Outcome: Progressing   Problem: Clinical Measurements: Goal: Ability to maintain clinical measurements within normal limits will improve Outcome: Progressing   

## 2017-12-12 DIAGNOSIS — F430 Acute stress reaction: Secondary | ICD-10-CM

## 2017-12-12 DIAGNOSIS — F419 Anxiety disorder, unspecified: Secondary | ICD-10-CM

## 2017-12-12 LAB — CBC WITH DIFFERENTIAL/PLATELET
ABS IMMATURE GRANULOCYTES: 0.35 10*3/uL — AB (ref 0.00–0.07)
Basophils Absolute: 0 10*3/uL (ref 0.0–0.1)
Basophils Relative: 0 %
Eosinophils Absolute: 0.6 10*3/uL — ABNORMAL HIGH (ref 0.0–0.5)
Eosinophils Relative: 4 %
HCT: 31.9 % — ABNORMAL LOW (ref 39.0–52.0)
HEMOGLOBIN: 9.7 g/dL — AB (ref 13.0–17.0)
IMMATURE GRANULOCYTES: 3 %
LYMPHS ABS: 2.5 10*3/uL (ref 0.7–4.0)
Lymphocytes Relative: 18 %
MCH: 27.6 pg (ref 26.0–34.0)
MCHC: 30.4 g/dL (ref 30.0–36.0)
MCV: 90.6 fL (ref 80.0–100.0)
MONOS PCT: 10 %
Monocytes Absolute: 1.4 10*3/uL — ABNORMAL HIGH (ref 0.1–1.0)
NEUTROS ABS: 9.2 10*3/uL — AB (ref 1.7–7.7)
Neutrophils Relative %: 65 %
Platelets: 674 10*3/uL — ABNORMAL HIGH (ref 150–400)
RBC: 3.52 MIL/uL — ABNORMAL LOW (ref 4.22–5.81)
RDW: 14.1 % (ref 11.5–15.5)
WBC: 14 10*3/uL — ABNORMAL HIGH (ref 4.0–10.5)
nRBC: 0 % (ref 0.0–0.2)

## 2017-12-12 MED ORDER — PRAZOSIN HCL 2 MG PO CAPS: 5.0000 mg | ORAL_CAPSULE | Freq: Every day | ORAL | Status: DC

## 2017-12-12 MED ORDER — PRAZOSIN HCL 1 MG PO CAPS
1.0000 mg | ORAL_CAPSULE | Freq: Every day | ORAL | Status: DC
  Administered 2017-12-12: 1 mg via ORAL
  Filled 2017-12-12: qty 1

## 2017-12-12 MED ORDER — HYDROXYZINE HCL 25 MG PO TABS: 25.0000 mg | ORAL_TABLET | Freq: Three times a day (TID) | ORAL | Status: DC | PRN

## 2017-12-12 NOTE — Progress Notes (Signed)
IP rehab admissions - I met with patient and his wife.  Noted plans to downsize trach today.  Patient up in chair and able to communicate with me even though jaws are wired.  Plans are for CIR once patient is medically ready.  I will contact trauma to discuss timing of inpatient rehab admission.  Call me for questions.  [phone]

## 2017-12-12 NOTE — Consult Note (Signed)
[facility] [place] [facility] Consult   Reason for Consult:  "Pt reliving accident and having nightmares/insomnia. Patient and wife requesting psych consult for PTSD." Referring Physician: Brigid Re, PA Patient Identification: Willie Blevins MRN:  [id number] Principal Diagnosis: Acute stress disorder Diagnosis:   Patient Active Problem List   Diagnosis Date Noted  . Closed extensive facial fractures ([place]) [S02.92XA]   . Fracture [X10.8XXA]   . Liver injury, laceration [S36.113A]   . Trauma [T14.90XA]   . 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 Pulmonary emphysema ([place]) [J43.9]   . Hyperglycemia [R73.9]   . Post-op pain [G89.18]   . Hyponatremia [E87.1]   . Crush injury, leg, lower, bilateral  [S87.80XA] 12/02/2017  . Open right fibular fracture [S82.401B] 12/02/2017  . Closed left subtrochanteric femur fracture ([place]) [S72.22XA] 11/29/2017    Total Time spent with patient: 1 hour  Subjective:   Willie Blevins is a 42 y.o. male patient admitted with multiple injuries following work related injury.  HPI:   Per chart review, patient was admitted with multiple injuries including left subtrochanteric femur fracture s/p tracheostomy after patient was crushed by a tree while working. He has been having flashbacks of the accident with insomnia.  Prazosin 5 mg qhs has been ordered to start this evening.   On interview, Willie Blevins reports that he has worked as a Acupuncturist for several years. He has never had major health problems except when he was treated for bullous emphysema a year ago. He reports anxiety related to thinking about the process to physically improve. He reports that it is hard for him to hear his treatment team tell him "you can do it" when undergoing various treatments and therapy. He reports flashbacks of his accident. He reports poor sleep due to nightmares. He reports feelings of sadness. He denies SI, HI or AVH. He denies a history of manic  symptoms (decreased need for sleep, increased energy, pressured speech or euphoria). He denies a prior psychiatric history.   Past Psychiatric History: Denies   Risk to Self:  None. Denies SI.  Risk to Others:  None. Denies HI. Prior Inpatient Therapy:  Denies  Prior Outpatient Therapy:  Denies   Past Medical History:  Past Medical History:  Diagnosis Date  . Bullous emphysema ([place])   . COPD (chronic obstructive pulmonary disease) ([place])   . GERD (gastroesophageal reflux disease)   . Open right fibular fracture 12/02/2017  . Traumatic iritis     Past Surgical History:  Procedure Laterality Date  . APPLICATION OF WOUND VAC Right 11/29/2017   Procedure: APPLICATION OF WOUND VAC;  Surgeon: Altamese Prattsville, MD;  Location: [facility];  Service: Orthopedics;  Laterality: Right;  . FACIAL LACERATION REPAIR Left 11/29/2017   Procedure: ear LACERATION REPAIR;  Surgeon: Georganna Skeans, MD;  Location: [facility];  Service: General;  Laterality: Left;  . I&D EXTREMITY Right 11/29/2017   Procedure: IRRIGATION AND DEBRIDEMENT EXTREMITY;  Surgeon: Altamese Duenweg, MD;  Location: [facility];  Service: Orthopedics;  Laterality: Right;  . I&D EXTREMITY Right 12/01/2017   Procedure: IRRIGATION AND DEBRIDEMENT RIGHT LOWER EXTREMITY AND VAC CHANGE;  Surgeon: Altamese Wellington, MD;  Location: [facility];  Service: Orthopedics;  Laterality: Right;  . INTRAMEDULLARY (IM) NAIL INTERTROCHANTERIC Left 11/29/2017   Procedure: INTRAMEDULLARY (IM) NAIL INTERTROCHANTRIC;  Surgeon: Altamese , MD;  Location: [facility];  Service: Orthopedics;  Laterality: Left;  . ORIF MANDIBULAR FRACTURE N/A 12/01/2017   Procedure: OPEN REDUCTION INTERNAL FIXATION (ORIF) MANDIBULAR  FRACTURE;  Surgeon: Melissa Montane, MD;  Location: [facility];  Service: ENT;  Laterality: N/A;  . TRACHEOSTOMY TUBE PLACEMENT N/A 12/01/2017   Procedure: TRACHEOSTOMY;  Surgeon: Melissa Montane, MD;  Location: [facility] OR;  Service: ENT;  Laterality: N/A;  . TYMPANOMASTOIDECTOMY Right  12/05/2017   Procedure: right canal up tympanomastoid, right mastoidectomy, exploration right facial nerve, Type 1 tympanoplasty, CSF leak repair.;  Surgeon: Vicie Mutters, MD;  Location: [facility];  Service: ENT;  Laterality: Right;   Family History:  Family History  Problem Relation Age of Onset  . Diabetes Mother   . Bell's palsy Mother   . Diabetes Maternal Uncle   . Diabetes Paternal Uncle    Family Psychiatric  History: Paternal uncle-completed suicide.  Social History:  Social History   Substance and Sexual Activity  Alcohol Use Not on file     Social History   Substance and Sexual Activity  Drug Use Not on file    Social History   Socioeconomic History  . Marital status: Married    Spouse name: Not on file  . Number of children: Not on file  . Years of education: Not on file  . Highest education level: Not on file  Occupational History  . Not on file  Social Needs  . Financial resource strain: Not on file  . Food insecurity:    Worry: Not on file    Inability: Not on file  . Transportation needs:    Medical: Not on file    Non-medical: Not on file  Tobacco Use  . Smoking status: Never Smoker  . Smokeless tobacco: Never Used  Substance and Sexual Activity  . Alcohol use: Not on file  . Drug use: Not on file  . Sexual activity: Not on file  Lifestyle  . Physical activity:    Days per week: Not on file    Minutes per session: Not on file  . Stress: Not on file  Relationships  . Social connections:    Talks on phone: Not on file    Gets together: Not on file    Attends religious service: Not on file    Active member of club or organization: Not on file    Attends meetings of clubs or organizations: Not on file    Relationship status: Not on file  Other Topics Concern  . Not on file  Social History Narrative  . Not on file   Additional Social History: He lives at home with his wife and 34 y/o, 72 y/o and 19 y/o child. He works as a Acupuncturist. He denies  illicit drug or alcohol use.     Allergies:   Allergies  Allergen Reactions  . Penicillins Hives    Has patient had a PCN reaction causing immediate rash, facial/tongue/throat swelling, SOB or lightheadedness with hypotension: Yes Has patient had a PCN reaction causing severe rash involving mucus membranes or skin necrosis: No Has patient had a PCN reaction that required hospitalization: No Has patient had a PCN reaction occurring within the last 10 years: No If all of the above answers are "NO", then may proceed with Cephalosporin use.     Labs:  Results for orders placed or performed during the hospital encounter of 11/29/17 (from the past 48 hour(s))  Glucose, capillary     Status: None   Collection Time: 12/10/17 12:27 PM  Result Value Ref Range   Glucose-Capillary 89 70 - 99 mg/dL  Glucose, capillary     Status: None  Collection Time: 12/10/17  4:22 PM  Result Value Ref Range   Glucose-Capillary 95 70 - 99 mg/dL  Glucose, capillary     Status: None   Collection Time: 12/10/17  7:40 PM  Result Value Ref Range   Glucose-Capillary 92 70 - 99 mg/dL  CBC with Differential/Platelet     Status: Abnormal   Collection Time: 12/11/17  2:49 AM  Result Value Ref Range   WBC 18.2 (H) 4.0 - 10.5 K/uL   RBC 3.32 (L) 4.22 - 5.81 MIL/uL   Hemoglobin 9.3 (L) 13.0 - 17.0 g/dL   HCT 30.3 (L) 39.0 - 52.0 %   MCV 91.3 80.0 - 100.0 fL   MCH 28.0 26.0 - 34.0 pg   MCHC 30.7 30.0 - 36.0 g/dL   RDW 14.6 11.5 - 15.5 %   Platelets 620 (H) 150 - 400 K/uL   nRBC 0.1 0.0 - 0.2 %   Neutrophils Relative % 66 %   Neutro Abs 12.1 (H) 1.7 - 7.7 K/uL   Lymphocytes Relative 17 %   Lymphs Abs 3.1 0.7 - 4.0 K/uL   Monocytes Relative 10 %   Monocytes Absolute 1.8 (H) 0.1 - 1.0 K/uL   Eosinophils Relative 4 %   Eosinophils Absolute 0.8 (H) 0.0 - 0.5 K/uL   Basophils Relative 0 %   Basophils Absolute 0.0 0.0 - 0.1 K/uL   Immature Granulocytes 3 %   Abs Immature Granulocytes 0.45 (H) 0.00 - 0.07 K/uL     Comment: Performed at [facility], [address] [place], [place] [address]  Glucose, capillary     Status: None   Collection Time: 12/11/17  7:30 AM  Result Value Ref Range   Glucose-Capillary 95 70 - 99 mg/dL  CBC with Differential/Platelet     Status: Abnormal   Collection Time: 12/12/17  2:24 AM  Result Value Ref Range   WBC 14.0 (H) 4.0 - 10.5 K/uL   RBC 3.52 (L) 4.22 - 5.81 MIL/uL   Hemoglobin 9.7 (L) 13.0 - 17.0 g/dL   HCT 31.9 (L) 39.0 - 52.0 %   MCV 90.6 80.0 - 100.0 fL   MCH 27.6 26.0 - 34.0 pg   MCHC 30.4 30.0 - 36.0 g/dL   RDW 14.1 11.5 - 15.5 %   Platelets 674 (H) 150 - 400 K/uL   nRBC 0.0 0.0 - 0.2 %   Neutrophils Relative % 65 %   Neutro Abs 9.2 (H) 1.7 - 7.7 K/uL   Lymphocytes Relative 18 %   Lymphs Abs 2.5 0.7 - 4.0 K/uL   Monocytes Relative 10 %   Monocytes Absolute 1.4 (H) 0.1 - 1.0 K/uL   Eosinophils Relative 4 %   Eosinophils Absolute 0.6 (H) 0.0 - 0.5 K/uL   Basophils Relative 0 %   Basophils Absolute 0.0 0.0 - 0.1 K/uL   Immature Granulocytes 3 %   Abs Immature Granulocytes 0.35 (H) 0.00 - 0.07 K/uL    Comment: Performed at [facility], [place] [address] [place], [place] [address]    Current Facility-Administered Medications  Medication Dose Route Frequency Provider Last Rate Last Dose  . 0.9 %  sodium chloride infusion   Intravenous Continuous Judeth Horn, MD 10 mL/hr at 12/10/17 0600    . acetaminophen (TYLENOL) solution 1,000 mg  1,000 mg Per Tube Sylvan Cheese, MD   1,000 mg at 12/10/17 2307  . artificial tears (LACRILUBE) ophthalmic ointment   Right Eye QHS,MR X 1 Judeth Horn, MD      .  ascorbic acid (VITAMIN C) 500 MG/5ML syrup 1,000 mg  1,000 mg Per Tube Daily Judeth Horn, MD   1,000 mg at 12/08/17 0934  . bethanechol (URECHOLINE) tablet 25 mg  25 mg Per Tube TID Judeth Horn, MD   25 mg at 12/09/17 1556  . bisacodyl (DULCOLAX) suppository 10 mg  10 mg Rectal Daily PRN Judeth Horn, MD      . cefTRIAXone (ROCEPHIN) 2 g in  sodium chloride 0.9 % 100 mL IVPB  2 g Intravenous Q24H Judeth Horn, MD 200 mL/hr at 12/11/17 1342 2 g at 12/11/17 1342  . chlorhexidine gluconate (MEDLINE KIT) (PERIDEX) 0.12 % solution 15 mL  15 mL Mouth Rinse BID Judeth Horn, MD   15 mL at 12/12/17 9417  . ciprofloxacin-dexamethasone (CIPRODEX) 0.3-0.1 % OTIC (EAR) suspension 3 drop  3 drop Right EAR TID Judeth Horn, MD   3 drop at 12/12/17 0830  . enoxaparin (LOVENOX) injection 40 mg  40 mg Subcutaneous Q24H Judeth Horn, MD   40 mg at 12/11/17 1036  . gabapentin (NEURONTIN) 250 MG/5ML solution 400 mg  400 mg Per Tube Sylvan Cheese, MD   400 mg at 12/10/17 2320  . HYDROmorphone (DILAUDID) injection 0.5-1 mg  0.5-1 mg Intravenous Q2H PRN Judeth Horn, MD   1 mg at 12/12/17 0825  . LORazepam (ATIVAN) injection 0.5-1 mg  0.5-1 mg Intravenous Q8H PRN Judeth Horn, MD   1 mg at 12/10/17 2304  . MEDLINE mouth rinse  15 mL Mouth Rinse Q4H Judeth Horn, MD   15 mL at 12/12/17 [phone]  . methocarbamol (ROBAXIN) tablet 1,000 mg  1,000 mg Per Tube TID Judeth Horn, MD   1,000 mg at 12/10/17 2306  . metoprolol tartrate (LOPRESSOR) injection 5 mg  5 mg Intravenous Q6H PRN Judeth Horn, MD      . ondansetron (ZOFRAN-ODT) disintegrating tablet 4 mg  4 mg Oral Q6H PRN Judeth Horn, MD       Or  . ondansetron [facility]) injection 4 mg  4 mg Intravenous Q6H PRN Judeth Horn, MD      . oxyCODONE (ROXICODONE) 5 MG/5ML solution 5-10 mg  5-10 mg Per Tube Q4H PRN Judeth Horn, MD   10 mg at 12/09/17 1556  . pantoprazole sodium (PROTONIX) 40 mg/20 mL oral suspension 40 mg  40 mg Per Tube Daily Judeth Horn, MD   40 mg at 12/09/17 1143  . polyvinyl alcohol (LIQUIFILM TEARS) 1.4 % ophthalmic solution 2 drop  2 drop Right Eye Q4H Judeth Horn, MD   2 drop at 12/12/17 0830  . prazosin (MINIPRESS) capsule 5 mg  5 mg Oral QHS [place], Floyce Stakes, PA-C        Musculoskeletal: Strength & Muscle Tone: within normal limits Gait & Station: unable to stand Patient leans:  N/A  Psychiatric Specialty Exam: Physical Exam  Nursing note and vitals reviewed. Constitutional: He is oriented to person, place, and time. He appears well-developed and well-nourished.  HENT:  Head: Normocephalic and atraumatic.  Neck: Normal range of motion.  Respiratory: Effort normal.  Musculoskeletal: Normal range of motion.  Neurological: He is alert and oriented to person, place, and time.  Psychiatric: His speech is normal and behavior is normal. Judgment and thought content normal. His mood appears anxious. Cognition and memory are normal.    Review of Systems  Psychiatric/Behavioral: Positive for depression.  All other systems reviewed and are negative.   Blood pressure 123/72, pulse 85, temperature 99 F (37.2 C), temperature source Axillary,  resp. rate 18, height '6\' 2"'  (1.88 m), weight 97.7 kg, SpO2 96 %.Body mass index is 27.65 kg/m.  General Appearance: Fairly Groomed, middle aged, Caucasian male, wearing a hospital gown with a right eye hematoma, feeding tube and tracheostomy who is lying in bed. NAD.   Eye Contact:  Good  Speech:  Clear and Coherent and Normal Rate  Volume:  Normal  Mood:  Anxious and Depressed  Affect:  Constricted  Thought Process:  Goal Directed, Linear and Descriptions of Associations: Intact  Orientation:  Full (Time, Place, and Person)  Thought Content:  Logical  Suicidal Thoughts:  No  Homicidal Thoughts:  No  Memory:  Immediate;   Good Recent;   Good Remote;   Good  Judgement:  Fair  Insight:  Fair  Psychomotor Activity:  Normal  Concentration:  Concentration: Good and Attention Span: Good  Recall:  Good  Fund of Knowledge:  Good  Language:  Good  Akathisia:  No  Handed:  Right  AIMS (if indicated):   N/A  Assets:  Communication Skills Desire for Improvement Financial Resources/Insurance Housing Intimacy Social Support  ADL's:  Impaired  Cognition:  WNL  Sleep:   N/A   Assessment:  Willie Blevins is a 42 y.o. male who  was admitted with multiple injuries including left subtrochanteric femur fracture s/p tracheostomy after patient was crushed by a tree while working. He endorses flashbacks of his trauma and anxiety symptoms. He also endorses depressed mood. He denies SI, HI or AVH. Agree with Prazosin for symptoms but would recommend starting at a lower dose to reduce risk of orthostatic hypotension. Can consider Lexapro if anxiety symptoms are not improved with Prazosin. He may benefit from therapy to process his emotions related to recent trauma.   Treatment Plan Summary: -Start Prazosin 1 mg qhs for nightmares related to trauma.  -Consider Atarax 25-50 mg TID PRN for anxiety. -Consider Lexapro 10 mg daily if Prazosin or Atarax is ineffective for anxiety. -Please have SW provide resources for psychiatrists and therapists in case needed in the future. -Would check EKG to closely monitor QTc when starting or increasing QTc prolonging agents.  -Psychiatry will sign off on patient at this time. Please consult psychiatry again as needed.      Disposition: No evidence of imminent risk to self or others at present.   Patient does not meet criteria for psychiatric inpatient admission.  Faythe Dingwall, DO 12/12/2017 11:02 AM

## 2017-12-12 NOTE — Progress Notes (Signed)
7 Days Post-Op   Subjective/Chief Complaint: Doing well. Able to wear [place]. Wires tight   Objective: Vital signs in last 24 hours: Temp:  [98.2 F (36.8 C)-98.5 F (36.9 C)] 98.2 F (36.8 C) (10/28 0406) Pulse Rate:  [82-109] 89 (10/28 0800) Resp:  [16-20] 18 (10/28 0800) BP: (116-124)/(67-73) 116/72 (10/28 0406) SpO2:  [93 %-100 %] 100 % (10/28 0800) FiO2 (%):  [28 %] 28 % (10/28 0800) Last BM Date: 12/11/17  Intake/Output from previous day: 10/27 0701 - 10/28 0700 In: 240 [P.O.:240] Out: 2400 [Urine:2400] Intake/Output this shift: Total I/O In: -  Out: 125 [Urine:125]  wires tight and wound healing well. trach with PM. breathing well  Lab Results:  Recent Labs    12/11/17 0249 12/12/17 0224  WBC 18.2* 14.0*  HGB 9.3* 9.7*  HCT 30.3* 31.9*  PLT 620* 674*   BMET Recent Labs    12/10/17 0656  NA 136  K 4.9  CL 102  CO2 22  GLUCOSE 83  BUN 22*  CREATININE 0.65  CALCIUM 8.3*   PT/INR No results for input(s): LABPROT, INR in the last 72 hours. ABG No results for input(s): PHART, HCO3 in the last 72 hours.  Invalid input(s): PCO2, PO2  Studies/Results: No results found.  Anti-infectives: Anti-infectives (From admission, onward)   Start     Dose/Rate Route Frequency Ordered Stop   12/06/17 2000  ceFEPIme (MAXIPIME) 2 g in sodium chloride 0.9 % 100 mL IVPB  Status:  Discontinued     2 g 200 mL/hr over 30 Minutes Intravenous Every 12 hours 12/05/17 1841 12/06/17 1010   12/06/17 1100  cefTRIAXone (ROCEPHIN) 2 g in sodium chloride 0.9 % 100 mL IVPB     2 g 200 mL/hr over 30 Minutes Intravenous Every 24 hours 12/06/17 1010     12/05/17 1731  ceFEPIme (MAXIPIME) 2 g in sodium chloride 0.9 % 100 mL IVPB  Status:  Discontinued     2 g 200 mL/hr over 30 Minutes Intravenous Every 12 hours 12/05/17 1732 12/05/17 1841   12/05/17 1730  vancomycin (VANCOCIN) 1,250 mg in sodium chloride 0.9 % 250 mL IVPB  Status:  Discontinued     1,250 mg 166.7 mL/hr over  90 Minutes Intravenous Every 8 hours 12/05/17 1706 12/06/17 0954   12/05/17 1555  polymyxin B 500,000 Units, bacitracin 50,000 Units in sodium chloride 0.9 % 500 mL irrigation  Status:  Discontinued       As needed 12/05/17 1555 12/05/17 1647   12/05/17 1330  vancomycin (VANCOCIN) IVPB 1000 mg/200 mL premix     1,000 mg 200 mL/hr over 60 Minutes Intravenous To Surgery 12/05/17 1318 12/05/17 1420   12/03/17 1200  vancomycin (VANCOCIN) IVPB 1000 mg/200 mL premix  Status:  Discontinued     1,000 mg 200 mL/hr over 60 Minutes Intravenous Every 8 hours 12/03/17 1050 12/05/17 1706   12/03/17 1200  ceFEPIme (MAXIPIME) 2 g in sodium chloride 0.9 % 100 mL IVPB  Status:  Discontinued     2 g 200 mL/hr over 30 Minutes Intravenous Every 12 hours 12/03/17 1050 12/05/17 1732   12/01/17 1032  tobramycin (NEBCIN) powder  Status:  Discontinued       As needed 12/01/17 1032 12/01/17 1105   12/01/17 1031  vancomycin (VANCOCIN) powder  Status:  Discontinued       As needed 12/01/17 1031 12/01/17 1105   11/29/17 2000  ceFAZolin (ANCEF) IVPB 2g/100 mL premix     2 g 200 mL/hr  over 30 Minutes Intravenous Every 8 hours 11/29/17 1617 12/01/17 1326   11/29/17 1334  vancomycin (VANCOCIN) powder  Status:  Discontinued       As needed 11/29/17 1335 11/29/17 1547   11/29/17 1327  tobramycin (NEBCIN) powder  Status:  Discontinued       As needed 11/29/17 1334 11/29/17 1547      Assessment/Plan: s/p Procedure(s): right canal up tympanomastoid, right mastoidectomy, exploration right facial nerve, Type 1 tympanoplasty, CSF leak repair. (Right) He has some spur on the inside of the mandible that he says is wearing on his tongue. We can cut wires on Thursday and place him in rubber bands. The trach can be changed to #6 uncuffed and plugged. If good airway will  remove trach.   LOS: 13 days    Suzanna Obey 12/12/2017

## 2017-12-12 NOTE — Progress Notes (Signed)
Trach change done at this time. Changed from 6.0 cuffed Shiley to 6.0 CFS shiley. Patient tolerated well, despite being very nervous. 2 sutures easily removed. Site appears very red and crusted with secretions. Wiped after trach removal before insertion of new trach. New trach went in, no issues. Positive ETCO2 color change. SAT 98% on Room Air with capped trach (per MD). Extra supplies at bedside. RT to monitor as needed

## 2017-12-12 NOTE — Progress Notes (Signed)
RT attempted to come and change trach at this time, but patient was working with PT/OT. Asked RN to call when he is back in the bed. All equipment to change trach is at bedside
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
## 2017-12-12 NOTE — Progress Notes (Signed)
Spoke with Dr. Jearld Fenton at bedside about downsizing to 6.0 CFS shilety and capping for the rest of the day. He will place order to change, and I will order trach. Will be changed shortly
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
## 2017-12-12 NOTE — Procedures (Signed)
Cortrak  Person Inserting Tube:  Betsey Holiday, RD Tube Type:  Cortrak - 43 inches Tube Location:  Left nare Initial Placement:  Postpyloric Secured by: Tape Technique Used to Measure Tube Placement:  Documented cm marking at nare/ corner of mouth Cortrak Secured At:  80 cm    Betsey Holiday MS, RD, LDN Pager #- [phone] Office#- [phone] After Hours Pager: [phone]

## 2017-12-13 ENCOUNTER — Encounter (HOSPITAL_COMMUNITY): Payer: Self-pay | Admitting: *Deleted

## 2017-12-13 ENCOUNTER — Encounter (HOSPITAL_COMMUNITY): Payer: Self-pay | Admitting: Physical Medicine and Rehabilitation

## 2017-12-13 ENCOUNTER — Inpatient Hospital Stay (HOSPITAL_COMMUNITY)
Admission: RE | Admit: 2017-12-13 | Discharge: 2017-12-24 | DRG: 559 | Disposition: A | Discharge status: Home / Self Care | Payer: Worker's Compensation | Source: Intra-hospital | Attending: Physical Medicine & Rehabilitation | Admitting: Physical Medicine & Rehabilitation

## 2017-12-13 ENCOUNTER — Other Ambulatory Visit: Payer: Self-pay

## 2017-12-13 DIAGNOSIS — J449 Chronic obstructive pulmonary disease, unspecified: Secondary | ICD-10-CM | POA: Diagnosis present

## 2017-12-13 DIAGNOSIS — S02609D Fracture of mandible, unspecified, subsequent encounter for fracture with routine healing: Secondary | ICD-10-CM

## 2017-12-13 DIAGNOSIS — S0291XA Unspecified fracture of skull, initial encounter for closed fracture: Secondary | ICD-10-CM | POA: Insufficient documentation

## 2017-12-13 DIAGNOSIS — F430 Acute stress reaction: Secondary | ICD-10-CM | POA: Diagnosis present

## 2017-12-13 DIAGNOSIS — D62 Acute posthemorrhagic anemia: Secondary | ICD-10-CM | POA: Diagnosis present

## 2017-12-13 DIAGNOSIS — S0291XS Unspecified fracture of skull, sequela: Secondary | ICD-10-CM | POA: Diagnosis not present

## 2017-12-13 DIAGNOSIS — S0219XD Other fracture of base of skull, subsequent encounter for fracture with routine healing: Principal | ICD-10-CM

## 2017-12-13 DIAGNOSIS — S06899D Other specified intracranial injury with loss of consciousness of unspecified duration, subsequent encounter: Secondary | ICD-10-CM

## 2017-12-13 DIAGNOSIS — S0291XD Unspecified fracture of skull, subsequent encounter for fracture with routine healing: Secondary | ICD-10-CM | POA: Diagnosis not present

## 2017-12-13 DIAGNOSIS — M7989 Other specified soft tissue disorders: Secondary | ICD-10-CM | POA: Diagnosis not present

## 2017-12-13 DIAGNOSIS — J690 Pneumonitis due to inhalation of food and vomit: Secondary | ICD-10-CM | POA: Diagnosis present

## 2017-12-13 DIAGNOSIS — S069X9A Unspecified intracranial injury with loss of consciousness of unspecified duration, initial encounter: Secondary | ICD-10-CM | POA: Diagnosis present

## 2017-12-13 DIAGNOSIS — M6282 Rhabdomyolysis: Secondary | ICD-10-CM | POA: Diagnosis present

## 2017-12-13 DIAGNOSIS — S82401E Unspecified fracture of shaft of right fibula, subsequent encounter for open fracture type I or II with routine healing: Secondary | ICD-10-CM

## 2017-12-13 DIAGNOSIS — F4310 Post-traumatic stress disorder, unspecified: Secondary | ICD-10-CM | POA: Diagnosis present

## 2017-12-13 DIAGNOSIS — S06339S Contusion and laceration of cerebrum, unspecified, with loss of consciousness of unspecified duration, sequela: Secondary | ICD-10-CM | POA: Diagnosis not present

## 2017-12-13 DIAGNOSIS — K219 Gastro-esophageal reflux disease without esophagitis: Secondary | ICD-10-CM | POA: Diagnosis present

## 2017-12-13 DIAGNOSIS — T796XXA Traumatic ischemia of muscle, initial encounter: Secondary | ICD-10-CM

## 2017-12-13 DIAGNOSIS — R945 Abnormal results of liver function studies: Secondary | ICD-10-CM

## 2017-12-13 DIAGNOSIS — S02609A Fracture of mandible, unspecified, initial encounter for closed fracture: Secondary | ICD-10-CM

## 2017-12-13 DIAGNOSIS — W230XXD Caught, crushed, jammed, or pinched between moving objects, subsequent encounter: Secondary | ICD-10-CM | POA: Diagnosis present

## 2017-12-13 DIAGNOSIS — S0451XA Injury of facial nerve, right side, initial encounter: Secondary | ICD-10-CM

## 2017-12-13 DIAGNOSIS — Z930 Tracheostomy status: Secondary | ICD-10-CM | POA: Diagnosis not present

## 2017-12-13 DIAGNOSIS — S8780XA Crushing injury of unspecified lower leg, initial encounter: Secondary | ICD-10-CM | POA: Diagnosis present

## 2017-12-13 DIAGNOSIS — S7222XD Displaced subtrochanteric fracture of left femur, subsequent encounter for closed fracture with routine healing: Secondary | ICD-10-CM

## 2017-12-13 DIAGNOSIS — S06330D Contusion and laceration of cerebrum, unspecified, without loss of consciousness, subsequent encounter: Secondary | ICD-10-CM | POA: Diagnosis not present

## 2017-12-13 DIAGNOSIS — S06339A Contusion and laceration of cerebrum, unspecified, with loss of consciousness of unspecified duration, initial encounter: Secondary | ICD-10-CM

## 2017-12-13 DIAGNOSIS — S7222XA Displaced subtrochanteric fracture of left femur, initial encounter for closed fracture: Secondary | ICD-10-CM | POA: Diagnosis present

## 2017-12-13 DIAGNOSIS — G510 Bell's palsy: Secondary | ICD-10-CM | POA: Diagnosis present

## 2017-12-13 DIAGNOSIS — R52 Pain, unspecified: Secondary | ICD-10-CM | POA: Diagnosis not present

## 2017-12-13 DIAGNOSIS — R7989 Other specified abnormal findings of blood chemistry: Secondary | ICD-10-CM

## 2017-12-13 LAB — CBC
HEMATOCRIT: 32.5 % — AB (ref 39.0–52.0)
HEMOGLOBIN: 10.1 g/dL — AB (ref 13.0–17.0)
MCH: 27.8 pg (ref 26.0–34.0)
MCHC: 31.1 g/dL (ref 30.0–36.0)
MCV: 89.5 fL (ref 80.0–100.0)
Platelets: 696 10*3/uL — ABNORMAL HIGH (ref 150–400)
RBC: 3.63 MIL/uL — ABNORMAL LOW (ref 4.22–5.81)
RDW: 14 % (ref 11.5–15.5)
WBC: 14.3 10*3/uL — ABNORMAL HIGH (ref 4.0–10.5)
nRBC: 0 % (ref 0.0–0.2)

## 2017-12-13 MED ORDER — ONDANSETRON HCL 4 MG/2ML IJ SOLN
4.0000 mg | Freq: Four times a day (QID) | INTRAMUSCULAR | Status: DC | PRN
  Administered 2017-12-14 – 2017-12-18 (×3): 4 mg via INTRAVENOUS
  Filled 2017-12-13 (×4): qty 2

## 2017-12-13 MED ORDER — BISACODYL 10 MG RE SUPP: 10.0000 mg | Freq: Every day | RECTAL | Status: DC | PRN

## 2017-12-13 MED ORDER — CHLORHEXIDINE GLUCONATE 0.12% ORAL RINSE (MEDLINE KIT)
15.0000 mL | Freq: Two times a day (BID) | OROMUCOSAL | Status: DC
  Administered 2017-12-13 – 2017-12-16 (×6): 15 mL via OROMUCOSAL

## 2017-12-13 MED ORDER — FLEET ENEMA 7-19 GM/118ML RE ENEM: 1.0000 | ENEMA | Freq: Once | RECTAL | Status: DC | PRN

## 2017-12-13 MED ORDER — POLYETHYLENE GLYCOL 3350 17 G PO PACK: 17.0000 g | PACK | Freq: Every day | ORAL | Status: DC | PRN

## 2017-12-13 MED ORDER — ORAL CARE MOUTH RINSE
15.0000 mL | OROMUCOSAL | Status: DC
  Administered 2017-12-13 – 2017-12-23 (×37): 15 mL via OROMUCOSAL

## 2017-12-13 MED ORDER — HYDROXYZINE HCL 25 MG PO TABS
25.0000 mg | ORAL_TABLET | Freq: Three times a day (TID) | ORAL | Status: DC | PRN
  Administered 2017-12-15 (×2): 50 mg via ORAL
  Filled 2017-12-13 (×2): qty 2

## 2017-12-13 MED ORDER — PROCHLORPERAZINE 25 MG RE SUPP: 12.5000 mg | Freq: Four times a day (QID) | RECTAL | Status: DC | PRN

## 2017-12-13 MED ORDER — GABAPENTIN 250 MG/5ML PO SOLN
400.0000 mg | Freq: Three times a day (TID) | ORAL | Status: DC
  Administered 2017-12-13 – 2017-12-16 (×7): 400 mg
  Filled 2017-12-13 (×13): qty 8

## 2017-12-13 MED ORDER — ACETAMINOPHEN 325 MG PO TABS
325.0000 mg | ORAL_TABLET | ORAL | Status: DC | PRN
  Administered 2017-12-17 (×2): 650 mg via ORAL
  Filled 2017-12-13 (×3): qty 2

## 2017-12-13 MED ORDER — BETHANECHOL CHLORIDE 25 MG PO TABS
25.0000 mg | ORAL_TABLET | Freq: Three times a day (TID) | ORAL | Status: DC
  Administered 2017-12-13 – 2017-12-16 (×9): 25 mg
  Filled 2017-12-13 (×9): qty 1

## 2017-12-13 MED ORDER — ARTIFICIAL TEARS OPHTHALMIC OINT
TOPICAL_OINTMENT | Freq: Every evening | OPHTHALMIC | Status: DC | PRN
  Administered 2017-12-13: 23:00:00 via OPHTHALMIC
  Administered 2017-12-14: 1 via OPHTHALMIC
  Administered 2017-12-14 – 2017-12-16 (×4): via OPHTHALMIC
  Administered 2017-12-16: 1 via OPHTHALMIC
  Administered 2017-12-17 – 2017-12-23 (×12): via OPHTHALMIC
  Filled 2017-12-13: qty 3.5

## 2017-12-13 MED ORDER — ENSURE ENLIVE PO LIQD
237.0000 mL | Freq: Three times a day (TID) | ORAL | Status: DC
  Administered 2017-12-13 – 2017-12-15 (×5): 237 mL via ORAL

## 2017-12-13 MED ORDER — ONDANSETRON 4 MG PO TBDP
4.0000 mg | ORAL_TABLET | Freq: Four times a day (QID) | ORAL | Status: DC | PRN
  Administered 2017-12-14: 4 mg via ORAL
  Filled 2017-12-13 (×2): qty 1

## 2017-12-13 MED ORDER — CIPROFLOXACIN-DEXAMETHASONE 0.3-0.1 % OT SUSP
3.0000 [drp] | Freq: Three times a day (TID) | OTIC | Status: AC
  Administered 2017-12-13 – 2017-12-23 (×30): 3 [drp] via OTIC
  Filled 2017-12-13 (×2): qty 7.5

## 2017-12-13 MED ORDER — TRAZODONE HCL 50 MG PO TABS
25.0000 mg | ORAL_TABLET | Freq: Every evening | ORAL | Status: DC | PRN
  Administered 2017-12-13 – 2017-12-18 (×4): 50 mg via ORAL
  Filled 2017-12-13 (×4): qty 1

## 2017-12-13 MED ORDER — PIVOT 1.5 CAL PO LIQD
1000.0000 mL | ORAL | Status: DC
  Administered 2017-12-13 – 2017-12-14 (×2): 1000 mL
  Filled 2017-12-13 (×4): qty 1000

## 2017-12-13 MED ORDER — ENSURE ENLIVE PO LIQD: 237.0000 mL | Freq: Three times a day (TID) | ORAL | Status: DC

## 2017-12-13 MED ORDER — ALUM & MAG HYDROXIDE-SIMETH 200-200-20 MG/5ML PO SUSP: 30.0000 mL | ORAL | Status: DC | PRN

## 2017-12-13 MED ORDER — PRAZOSIN HCL 1 MG PO CAPS
1.0000 mg | ORAL_CAPSULE | Freq: Every day | ORAL | Status: DC
  Administered 2017-12-13 – 2017-12-24 (×11): 1 mg via ORAL
  Filled 2017-12-13 (×12): qty 1

## 2017-12-13 MED ORDER — POLYVINYL ALCOHOL 1.4 % OP SOLN
2.0000 [drp] | OPHTHALMIC | Status: DC
  Administered 2017-12-13 – 2017-12-24 (×61): 2 [drp] via OPHTHALMIC
  Filled 2017-12-13: qty 15

## 2017-12-13 MED ORDER — ENOXAPARIN SODIUM 40 MG/0.4ML ~~LOC~~ SOLN
40.0000 mg | SUBCUTANEOUS | Status: DC
  Administered 2017-12-14 – 2017-12-23 (×9): 40 mg via SUBCUTANEOUS
  Filled 2017-12-13 (×9): qty 0.4

## 2017-12-13 MED ORDER — PANTOPRAZOLE SODIUM 40 MG PO PACK
40.0000 mg | PACK | Freq: Every day | ORAL | Status: DC
  Administered 2017-12-14 – 2017-12-16 (×3): 40 mg
  Filled 2017-12-13 (×2): qty 20

## 2017-12-13 MED ORDER — VITAMIN C 500 MG/5ML PO SYRP
1000.0000 mg | ORAL_SOLUTION | Freq: Every day | ORAL | Status: DC
  Administered 2017-12-14 – 2017-12-21 (×8): 1000 mg
  Filled 2017-12-13 (×9): qty 10

## 2017-12-13 MED ORDER — METHOCARBAMOL 500 MG PO TABS
1000.0000 mg | ORAL_TABLET | Freq: Three times a day (TID) | ORAL | Status: DC
  Administered 2017-12-13 – 2017-12-24 (×32): 1000 mg
  Filled 2017-12-13 (×33): qty 2

## 2017-12-13 MED ORDER — GUAIFENESIN-DM 100-10 MG/5ML PO SYRP: 5.0000 mL | ORAL_SOLUTION | Freq: Four times a day (QID) | ORAL | Status: DC | PRN

## 2017-12-13 MED ORDER — ACETAMINOPHEN 160 MG/5ML PO SOLN
1000.0000 mg | Freq: Three times a day (TID) | ORAL | Status: DC
  Administered 2017-12-13 – 2017-12-19 (×17): 1000 mg
  Filled 2017-12-13 (×18): qty 40.6

## 2017-12-13 MED ORDER — LIDOCAINE HCL URETHRAL/MUCOSAL 2 % EX GEL
CUTANEOUS | Status: DC | PRN
  Filled 2017-12-13: qty 50

## 2017-12-13 MED ORDER — DIPHENHYDRAMINE HCL 12.5 MG/5ML PO ELIX: 12.5000 mg | ORAL_SOLUTION | Freq: Four times a day (QID) | ORAL | Status: DC | PRN

## 2017-12-13 MED ORDER — FREE WATER
200.0000 mL | Freq: Three times a day (TID) | Status: DC
  Administered 2017-12-13 – 2017-12-19 (×17): 200 mL

## 2017-12-13 MED ORDER — PROCHLORPERAZINE EDISYLATE 10 MG/2ML IJ SOLN: 5.0000 mg | Freq: Four times a day (QID) | INTRAMUSCULAR | Status: DC | PRN

## 2017-12-13 MED ORDER — SODIUM CHLORIDE 0.9 % IV SOLN
2.0000 g | INTRAVENOUS | Status: AC
  Administered 2017-12-14 – 2017-12-24 (×11): 2 g via INTRAVENOUS
  Filled 2017-12-13 (×13): qty 20

## 2017-12-13 MED ORDER — OXYCODONE HCL 5 MG/5ML PO SOLN
5.0000 mg | ORAL | Status: DC | PRN
  Administered 2017-12-13 – 2017-12-19 (×25): 10 mg
  Administered 2017-12-19: 5 mg
  Filled 2017-12-13 (×4): qty 10
  Filled 2017-12-13: qty 5
  Filled 2017-12-13 (×17): qty 10
  Filled 2017-12-13: qty 5
  Filled 2017-12-13 (×5): qty 10

## 2017-12-13 MED ORDER — PROCHLORPERAZINE MALEATE 5 MG PO TABS: 5.0000 mg | ORAL_TABLET | Freq: Four times a day (QID) | ORAL | Status: DC | PRN

## 2017-12-13 MED ORDER — SODIUM CHLORIDE 0.9 % IV SOLN
2.0000 g | INTRAVENOUS | Status: DC
  Filled 2017-12-13: qty 20

## 2017-12-13 MED ORDER — LORAZEPAM 2 MG/ML IJ SOLN
0.5000 mg | Freq: Three times a day (TID) | INTRAMUSCULAR | Status: DC | PRN
  Administered 2017-12-14 – 2017-12-18 (×2): 1 mg via INTRAVENOUS
  Filled 2017-12-13 (×4): qty 1
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
## 2017-12-13 NOTE — Progress Notes (Signed)
S: Moved to 6N-12, improving, sitting up in bed, wife at bedside, remains of rocephin 2 grams Q24 hrs.  O: Awake and alert, talking in IMF. Trach in place,      Complete right facial paralysis as expected given the avulsion injury of the interlabyrinthine segment of VIIth. Nerve.      Lateral ear canal packing removed. Medial gelfoam packing left in place.      R postauricular sutures and anterior tragal sutures removed. Drained some purulence from the inferior 1/3 of the post-         auricular incision, cleaned with peroxide, antibiotic ointment to be applied (probable small suture abscess).   A: 1. Small right postauricular suture abscess drained. Needs to continue on Rocephin IV 2 grams Q24 hrs. Right ear otherwise stable. Medial packing will be left in place due to tympanoplasty graft. 2. Complete right facial paralysis. Right eyelids taped. 3. Moving to rehab today, 4W-12. 4. Will eventually require a right lateral tarsorraphy and possibly a right 12-7 or cross face graft, if the patient desires. 5. Will eventually benefit from either a hearing aid AD, BAHA hearing implant, or right tympanoplasty with ossiculoplasty if he has useful residual hearing AD.  P: 1. Continue Rocephin 2 grams IV Q24. 2. Will follow. 3. Will require post-hospitalization audiometric testing in my office.

## 2017-12-13 NOTE — H&P (Signed)
Physical Medicine and Rehabilitation Admission H&P    Chief Complaint  Patient presents with  . Trauma    HPI: Willie Blevins is a 42 year old male with history of emphysema who was admitted on 11/29/2017 after being struck by a tree at work.  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  CT head was unremarkable for acute intracranial process.  He was intubated in the ED and taken to the OR for repair of lacerations as well as IM nailing left femoral shaft and IND open fibula fracture with placement of wound VAC by Dr. Carola Frost.  Dr. Donell Beers consulted for input and felt that patient would require MMT as well as tracheostomy.  He was taken back to the OR on 10/17 for ORIF mandibular fracture with tracheostomy by Dr. Donell Beers and dressing change with I&D and excision of muscle and fat RLE.  He is to be PWB LLE and WBAT on RLE with Cam boot.  Aggressive range of motion BLE with recommendations of 30-day DVT prophylaxis with use of Lovenox.  Rhabdomyolysis due to crush injuries treated with IVF for hydration.  Postop with increase in oral secretions with fevers and treated with IV antibiotics for aspiration pneumonia.  He was taken back to the OR on 10/21 for right canal tympanomastoidectomy with exploration and partial decompression of right facial nerve, removal of fracture/dislocated incus from middle fossa and repair of CSF leak by Dr. Dorma Russell.  He was slow to wean off the ventilator due to mucous plugging as well as need of propofol for agitation.    He was extubated to ATC on 10/22 and is  tolerating ATC.  He was downsized to CFS #6 yesterday and is tolerating button plugging X 24 hours.  Psychiatry consulted for input due to issues with anxiety and flashbacks as well as nightmares.  Prazosin added for nightmares with recommendations to use Atarax as needed or addition of Lexapro if latter ineffective.  Postauricular sutures removed today and he was noted to have suture abscess therefore Rocephin recommended by Dr. Dorma Russell.  Plans to have mandibular mandibular wires cut on Thursday per Dr. Jearld Fenton.  Patient on full liquid diet however easy fatigability with intake therefore cortak replaced today.  Patient with delayed processing with intermittent bouts of agitation, requires verbal tactile cues and able to follow one-step commands consistently, has significant deficits in mobility and ability to carry out ADLs. CIR recommended due to polytrauma with significant deficits   Review of Systems  Constitutional: Negative for chills and fever.  HENT: Negative for hearing loss and tinnitus.   Eyes: Positive for blurred vision (right. ).  Respiratory: Negative for cough and sputum production.   Cardiovascular: Positive for chest pain (left lower chest wall pain due to rib fractures) and leg swelling.  Gastrointestinal: Positive for diarrhea. Negative for constipation, heartburn and nausea.  Genitourinary: Positive for dysuria. Negative for frequency and urgency.  Musculoskeletal: Positive for myalgias.  Skin: Negative for itching and rash.  Neurological: Positive for sensory change (LLE and right face), speech change and weakness.  Psychiatric/Behavioral: The patient has insomnia (but no nightmares last night).  Past Medical History:  Diagnosis Date  . Bullous emphysema (HCC)   . COPD (chronic obstructive pulmonary disease) (HCC)   . GERD (gastroesophageal reflux disease)   . Open right fibular fracture 12/02/2017  . Strabismus    with lazy eye (left)  . Traumatic iritis    Past  Surgical History:  Procedure Laterality Date  . APPLICATION OF WOUND VAC Right 11/29/2017   Procedure: APPLICATION OF WOUND VAC;  Surgeon: Myrene Galas, MD;  Location: [facility] OR;  Service: Orthopedics;  Laterality: Right;  . FACIAL LACERATION REPAIR Left 11/29/2017   Procedure: ear LACERATION REPAIR;  Surgeon: Violeta Gelinas, MD;  Location: [facility] OR;  Service: General;  Laterality: Left;  . I&D EXTREMITY Right 11/29/2017   Procedure: IRRIGATION AND DEBRIDEMENT EXTREMITY;  Surgeon: Myrene Galas, MD;  Location: [facility] OR;  Service: Orthopedics;  Laterality: Right;  . I&D EXTREMITY Right 12/01/2017   Procedure: IRRIGATION AND DEBRIDEMENT RIGHT LOWER EXTREMITY AND VAC CHANGE;  Surgeon: Myrene Galas, MD;  Location: [facility] OR;  Service: Orthopedics;  Laterality: Right;  . INTRAMEDULLARY (IM) NAIL INTERTROCHANTERIC Left 11/29/2017   Procedure: INTRAMEDULLARY (IM) NAIL INTERTROCHANTRIC;  Surgeon: Myrene Galas, MD;  Location: [facility] OR;  Service: Orthopedics;  Laterality: Left;  . ORIF MANDIBULAR FRACTURE N/A 12/01/2017   Procedure: OPEN REDUCTION INTERNAL FIXATION (ORIF) MANDIBULAR FRACTURE;  Surgeon: Suzanna Obey, MD;  Location: [facility] OR;  Service: ENT;  Laterality: N/A;  . TRACHEOSTOMY TUBE PLACEMENT N/A 12/01/2017   Procedure: TRACHEOSTOMY;  Surgeon: Suzanna Obey, MD;  Location: [facility] OR;  Service: ENT;  Laterality: N/A;  . TYMPANOMASTOIDECTOMY Right 12/05/2017   Procedure: right canal up tympanomastoid, right mastoidectomy, exploration right facial nerve, Type 1 tympanoplasty, CSF leak repair.;  Surgeon: Ermalinda Barrios, MD;  Location: [facility] OR;  Service: ENT;  Laterality: Right;   Family History  Problem Relation Age of Onset  . Diabetes Mother   . Bell's palsy Mother   . Diabetes Maternal Uncle   . Diabetes Paternal Uncle    Social History:  reports that he has never smoked. He has never used smokeless tobacco. His alcohol and drug histories are not on file. Allergies:  Allergies  Allergen Reactions  .  Penicillins Hives    Has patient had a PCN reaction causing immediate rash, facial/tongue/throat swelling, SOB or lightheadedness with hypotension: Yes Has patient had a PCN reaction causing severe rash involving mucus membranes or skin necrosis: No Has patient had a PCN reaction that required hospitalization: No Has patient had a PCN reaction occurring within the last 10 years: No If all of the above answers are "NO", then may proceed with Cephalosporin use.    No medications prior to admission.    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  Functional Status:  Mobility: Bed Mobility Overal bed mobility: Needs Assistance Bed Mobility: Supine to Sit Rolling: Mod assist, +2 for physical assistance Supine to sit: Mod assist, HOB elevated, +2 for safety/equipment Sit to supine: +2 for physical assistance, Max assist General bed mobility comments: pt  overall able to support trunk and transition into upright sitting; assist for LLE over EOB and to lower to floor using pillow for increased LE support Transfers Overall transfer level: Needs assistance Equipment used: None Transfers: Lateral/Scoot Transfers  Lateral/Scoot Transfers: Min assist, +2 physical assistance, +2 safety/equipment General transfer comment: pt with good use of UEs to assist with lateral scoot to drop arm  recliner; use of bed pad to assist with transfer/guiding hips Ambulation/Gait General Gait Details: unable this date    ADL: ADL Overall ADL's : Needs assistance/impaired Lower Body Dressing: Maximal assistance, Bed level Lower Body Dressing Details (indicate cue type and reason): pt's spouse assisting to don socks at bed level this session Functional mobility during ADLs: Minimal assistance, +2 for physical assistance, +2 for safety/equipment(for lateral/scoot transfer) General ADL Comments: pt with improvements in mobility/transfers this session; began education on plans to begin practicing standing as prep for further transfers/mobility within WB precautions/limitations; further educated on CIR process as pt hoping for CIR stay after discharge  Cognition: Cognition Overall Cognitive Status: Difficult to assess Arousal/Alertness: Awake/alert Orientation Level: Oriented X4 Attention: Alternating Alternating Attention: Impaired Alternating Attention Impairment: Verbal complex, Functional complex Memory: Impaired Memory Impairment: Retrieval deficit Awareness: Appears intact Problem Solving: Appears intact Executive Function: Reasoning Reasoning: Appears intact Cognition Arousal/Alertness: Awake/alert Behavior During Therapy: Agitated, WFL for tasks assessed/performed(intermittent agitation due to pain (initially)) Overall Cognitive Status: Difficult to assess Area of Impairment: Following commands, Problem solving Current Attention Level: Selective Following Commands: Follows one step commands consistently Awareness: Emergent Problem Solving: Slow processing, Requires verbal cues, Requires tactile cues General Comments: pt intermittently agitated/easily frustrated initially, mostly due to pain, pt later apologetic in session for frustrations Difficult to assess due to: Tracheostomy   Blood pressure 119/71, pulse 97, temperature 98.8 F (37.1 C), temperature source Oral, resp.  rate 16, height 6\' 2"  (1.88 m), weight 97.7 kg, SpO2 99 %. Physical Exam  Nursing note and vitals reviewed. Constitutional: He is oriented to person, place, and time. No distress.  HENT:  Jaws wired, bruises on face. Right eye taped  Eyes:  Right eye taped shut. Bilateral infraorbital ecchymosis resolving.   Neck:  Plugged trach in place. Moisture under trach dressing with occasional sounds of air loss.   Respiratory: No stridor. He exhibits tenderness (left lower rib area. ).  GI: He exhibits no distension. There is no tenderness.  Musculoskeletal:  1+ edema left thigh down to 2+ left foot. Multiple sutures LLE with scabbed abrasions on left lateral shin. Min edema right foot with dry dressing right shin.    Neurological: He is alert and oriented to person, place, and time.  Right facial weakness with slurred speech due to wires. Able to follow basic commands without difficulty. Right peripheral 7 with inability to close right eye lids and right facial droop. Fairly aware. Recalls accident. Follows basic commands. No language issues. Moves UE least 4/5. Lifts RLE easily against gravity. Can wiggle toes of both feet. Unable to lift left leg. Normal sensation  Skin: Skin is warm and dry. He is not diaphoretic.  Psychiatric: He has a normal mood and affect. His behavior is normal.    Results for orders placed or performed during the hospital encounter of 11/29/17 (from the past 48 hour(s))  CBC with Differential/Platelet     Status: Abnormal   Collection Time: 12/12/17  2:24 AM  Result Value Ref Range   WBC 14.0 (H) 4.0 - 10.5 K/uL   RBC 3.52 (L) 4.22 - 5.81  MIL/uL   Hemoglobin 9.7 (L) 13.0 - 17.0 g/dL   HCT 16.1 (L) 09.6 - 04.5 %   MCV 90.6 80.0 - 100.0 fL   MCH 27.6 26.0 - 34.0 pg   MCHC 30.4 30.0 - 36.0 g/dL   RDW 40.9 81.1 - 91.4 %   Platelets 674 (H) 150 - 400 K/uL   nRBC 0.0 0.0 - 0.2 %   Neutrophils Relative % 65 %   Neutro Abs 9.2 (H) 1.7 - 7.7 K/uL   Lymphocytes Relative 18  %   Lymphs Abs 2.5 0.7 - 4.0 K/uL   Monocytes Relative 10 %   Monocytes Absolute 1.4 (H) 0.1 - 1.0 K/uL   Eosinophils Relative 4 %   Eosinophils Absolute 0.6 (H) 0.0 - 0.5 K/uL   Basophils Relative 0 %   Basophils Absolute 0.0 0.0 - 0.1 K/uL   Immature Granulocytes 3 %   Abs Immature Granulocytes 0.35 (H) 0.00 - 0.07 K/uL    Comment: Performed at [facility], [address], [place], [place] [address]  CBC     Status: Abnormal   Collection Time: 12/13/17  1:57 AM  Result Value Ref Range   WBC 14.3 (H) 4.0 - 10.5 K/uL   RBC 3.63 (L) 4.22 - 5.81 MIL/uL   Hemoglobin 10.1 (L) 13.0 - 17.0 g/dL   HCT 62.1 (L) 30.8 - 65.7 %   MCV 89.5 80.0 - 100.0 fL   MCH 27.8 26.0 - 34.0 pg   MCHC 31.1 30.0 - 36.0 g/dL   RDW 84.6 96.2 - 95.2 %   Platelets 696 (H) 150 - 400 K/uL   nRBC 0.0 0.0 - 0.2 %    Comment: Performed at [facility], [address], [place], [place] [address]   No results found.     Medical Problem List and Plan: 1.  Functional deficits secondary to TBI/facial trauma with 7th nerve palsy/polytrauma  -admit to inpatient rehab 2.  DVT Prophylaxis/Anticoagulation: Pharmaceutical: Lovenox 3. Pain Management: Continue scheduled tylenol, gabapentin and robaxin. Oxycodone prn for severe pain.    4. Mood: LCSW to follow for evaluation and support.  5. Neuropsych: This patient is not fully capable of making decisions on his own behalf. 6. Skin/Wound Care: Monitor wounds for healing. Continue nutritional supplements to promote healing.  7. Fluids/Electrolytes/Nutrition: Monitor I/O. Check lytes in am. Calorie count. Start tube feeds.  8.  Acute hypoxic respiratory failure: Tolerating T collar.  Downsized to CFS# 6 on 10/28  -trach is capped 9. 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  Will need audiology evaluation past discharge 10.  Right postauricular  suture abscess: Continue Rocephin IV 2 g daily per ENT input 11.  Bilateral LLE crush injuries with left septic femur fracture and right open fibula fracture with extensive muscle injury: PWB on RLE and WBAT LLE.  12.  Right facial and mandible fracture status post ORIF mandible 10/17:  13.  Acute stress disorder with anxiety: Started on prazosin for nightmares.  Psychiatry recommends adding Lexapro if prazosin or Atarax ineffective for management of anxiety 14. Aspiration PNA: Leucocytosis resolving. Continue Rocephin added 10/22--D# 15. ABLA: Continue to monitor for recovery.   16. Rhabdomyolysis: CK improving 1815-->4575--> 1,963. Will add water flushes. Encourage fuid intake.  17. Abnormal LFTs: Recheck in am.   Post Admission Physician Evaluation: 1. Functional deficits secondary  to TBI with polytrauma. 2. Patient  is admitted to receive collaborative, interdisciplinary care between the physiatrist, rehab nursing staff, and therapy team. 3. Patient's level of medical complexity and substantial therapy needs in context of that medical necessity cannot be provided at a lesser intensity of care such as a SNF. 4. Patient has experienced substantial functional loss from his/her baseline which was documented above under the "Functional History" and "Functional Status" headings.  Judging by the patient's diagnosis, physical exam, and functional history, the patient has potential for functional progress which will result in measurable gains while on inpatient rehab.  These gains will be of substantial and practical use upon discharge  in facilitating mobility and self-care at the household level. 5. Physiatrist will provide 24 hour management of medical needs as well as oversight of the therapy plan/treatment and provide guidance as appropriate regarding the interaction of the two. 6. The Preadmission Screening has been reviewed and patient status is unchanged unless otherwise stated above. 7. 24 hour  rehab nursing will assist with bladder management, bowel management, safety, skin/wound care, disease management, medication administration, pain management and patient education  and help integrate therapy concepts, techniques,education, etc. 8. PT will assess and treat for/with: Lower extremity strength, range of motion, stamina, balance, functional mobility, safety, adaptive techniques and equipment, NMR, ortho precautions, pain control.   Goals are: supervision to min assist. 9. OT will assess and treat for/with: ADL's, functional mobility, safety, upper extremity strength, adaptive techniques and equipment, NMR, pain mgt, family ed.   Goals are: supervision to min assist. Therapy may proceed with showering this patient. 10. SLP will assess and treat for/with: cognition, communication, swallowing.  Goals are: min assist. 11. Case Management and Social Worker will assess and treat for psychological issues and discharge planning. 12. Team conference will be held weekly to assess progress toward goals and to determine barriers to discharge. 13. Patient will receive at least 3 hours of therapy per day at least 5 days per week. 14. ELOS: 18-24 days days       15. Prognosis:  excellent   I have personally performed a face to face diagnostic evaluation of this patient and formulated the key components of the plan.  Additionally, I have personally reviewed laboratory data, imaging studies, as well as relevant notes and concur with the physician assistant's documentation above.  Ranelle Oyster, MD, [place] [facility]    Jacquelynn Cree, PA-C 12/13/2017

## 2017-12-13 NOTE — Progress Notes (Signed)
IP rehab admissions - I have approval for acute inpatient rehab admission from workers comp Sports coach.  Bed available and will admit to CIR today.  Call me for questions.  [phone]

## 2017-12-13 NOTE — Progress Notes (Signed)
Retta Diones, RN  Rehab Admission Coordinator  Physical Medicine and Rehabilitation  PMR Pre-admission  Signed  Date of Service:  12/13/2017 9:11 AM       Related encounter: ED to Hosp-Admission (Discharged) from 11/29/2017 in [facility]         Show:Clear all '[x]' Manual'[x]' Template'[x]' Copied  Added by: '[x]' Matilde Markie, Evalee Mutton, RN  '[]' Hover for details PMR Admission Coordinator Pre-Admission Assessment  Patient: Willie Blevins is an 42 y.o., male MRN: [id number] DOB: 01/22/1976 Height: '6\' 2"'  (188 cm) Weight: 97.7 kg                                                                                                                                                  Insurance Information HMO:    PPO:       PCP:       IPA:       80/20:       OTHER:   PRIMARY:  Generic workers comp      Policy#: [id number]      Subscriber: patient CM Name: Margaretmary Bayley      Phone#: [phone]     Fax#: [phone] Pre-Cert#: [id number]      Employer:  [employer] Benefits:  Phone #:       Name:   Irene Shipper. Date: 11/29/17 date of injury     Deduct:        Out of Pocket Max:        Life Max:   CIR: Approved       SNF:   Outpatient:       Co-Pay:   Home Health:        Co-Pay:   DME:       Co-Pay:   Providers:    Medicaid Application Date:        Case Manager:   Disability Application Date:        Case Worker:    Emergency Contact Information         Contact Information    Name Relation Home Work Mobile   Asquith,MICHELLE Spouse [phone]       Current Medical History  Patient Admitting Diagnosis:  TBI with polytrauma  History of Present Illness: A 42 y.o.malewith history of emphysema who was admitted on 11/29/2017 after being struck by a tree at work.History taken from chart review and wife.Patient hypotensive at admission and work-up revealedTBI with central skull base fracture displaced at right foramen rotundum and right  temporal bone fracture with malleoincudal subluxation,right facial nerve injury,right maxillary sinus,lateral orbital wall, right mastoid process and nasal septum fractures,right scalp and ear lacerations,splenic and liver lacerations, crush injuries BLE withopen right fibula fractureandleft subtrochanteric femur fracture.CT head reviewed, unremarkable for acute intracranial process.He was intubated in ED and taken to the OR for  repair of lacerations as well as IM nailing left femoral shaft and I&D open fibular fracture with placement of wound VAC.Dr. Nigel Sloop for input on facial fractures and felt that patient would likely require anMMTas well as tracheostomy. He was taken back to the OR on 10/17 for ORIF mandibular fracture with tracheostomy by Dr. Janace Hoard and dressing change with I&D with excision of muscle and fat right lower extremity by Dr. Marcelino Scot. Post op with increase in oral secretions with fevers and treated with IV antibiotics for aspiration PNA.  He was taken to the OR on 10/21 for right canaltympanomastoidectomy with explorationandpartial decompression of right facial nerve, removal of fracture/dislocated incus from middle fossa and repair of CSF leak by Dr. Thornell Mule. He is to be PWB LLE and WBAT on RLE withCam bootandh recommendations to continue range of motion bilateral lower extremities. Rhabdomyolysis due to crush injuries treated with hydration and currently on tube feeds for nutritional support.He had difficulty with vent wean Friday due to mucous plugging. Propofol d/c this am and started on weaning today.PT evaluation initiated yesterday and CIR recommended due to significant deficits.  #6 cuffed shiley trach was downsized to #6 cuffless shiley on 12/12/17.  Diet has progressed to full liquids.  Up on chair 12/12/17 and tolerating activity well.  Will admit to inpatient rehab program today.   Past Medical History      Past Medical History:  Diagnosis  Date  . Bullous emphysema ([place])   . COPD (chronic obstructive pulmonary disease) ([place])   . GERD (gastroesophageal reflux disease)   . Open right fibular fracture 12/02/2017  . Traumatic iritis     Family History  family history includes Bell's palsy in his mother; Diabetes in his maternal uncle, mother, and paternal uncle.  Prior Rehab/Hospitalizations: Had a bulla burst in his lung about a year ago and it was treated.  Has the patient had major surgery during 100 days prior to admission? No  Current Medications   Current Facility-Administered Medications:  .  0.9 %  sodium chloride infusion, , Intravenous, Continuous, Judeth Horn, MD, Last Rate: 10 mL/hr at 12/10/17 0600 .  acetaminophen (TYLENOL) solution 1,000 mg, 1,000 mg, Per Tube, Q8H, Judeth Horn, MD, 1,000 mg at 12/13/17 6712 .  artificial tears (LACRILUBE) ophthalmic ointment, , Right Eye, QHS,MR X 1, Judeth Horn, MD .  ascorbic acid (VITAMIN C) 500 MG/5ML syrup 1,000 mg, 1,000 mg, Per Tube, Daily, Judeth Horn, MD, 1,000 mg at 12/12/17 1147 .  bethanechol (URECHOLINE) tablet 25 mg, 25 mg, Per Tube, TID, Judeth Horn, MD, 25 mg at 12/12/17 2331 .  bisacodyl (DULCOLAX) suppository 10 mg, 10 mg, Rectal, Daily PRN, Judeth Horn, MD .  cefTRIAXone (ROCEPHIN) 2 g in sodium chloride 0.9 % 100 mL IVPB, 2 g, Intravenous, Q24H, Judeth Horn, MD, Last Rate: 200 mL/hr at 12/12/17 1157, 2 g at 12/12/17 1157 .  chlorhexidine gluconate (MEDLINE KIT) (PERIDEX) 0.12 % solution 15 mL, 15 mL, Mouth Rinse, BID, Judeth Horn, MD, 15 mL at 12/12/17 2051 .  ciprofloxacin-dexamethasone (CIPRODEX) 0.3-0.1 % OTIC (EAR) suspension 3 drop, 3 drop, Right EAR, TID, Judeth Horn, MD, 3 drop at 12/12/17 2052 .  enoxaparin (LOVENOX) injection 40 mg, 40 mg, Subcutaneous, Q24H, Judeth Horn, MD, 40 mg at 12/12/17 1147 .  gabapentin (NEURONTIN) 250 MG/5ML solution 400 mg, 400 mg, Per Tube, Q8H, Judeth Horn, MD, 400 mg at 12/13/17 [phone] .   HYDROmorphone (DILAUDID) injection 0.5-1 mg, 0.5-1 mg, Intravenous, Q2H PRN, Judeth Horn, MD, 1 mg at  12/13/17 0223 .  hydrOXYzine (ATARAX/VISTARIL) tablet 25-50 mg, 25-50 mg, Oral, TID PRN, Judeth Horn, MD .  LORazepam (ATIVAN) injection 0.5-1 mg, 0.5-1 mg, Intravenous, Q8H PRN, Judeth Horn, MD, 1 mg at 12/10/17 2304 .  MEDLINE mouth rinse, 15 mL, Mouth Rinse, Q4H, Judeth Horn, MD, 15 mL at 12/12/17 2331 .  methocarbamol (ROBAXIN) tablet 1,000 mg, 1,000 mg, Per Tube, TID, Judeth Horn, MD, 1,000 mg at 12/12/17 2331 .  metoprolol tartrate (LOPRESSOR) injection 5 mg, 5 mg, Intravenous, Q6H PRN, Judeth Horn, MD .  ondansetron (ZOFRAN-ODT) disintegrating tablet 4 mg, 4 mg, Oral, Q6H PRN **OR** ondansetron (ZOFRAN) injection 4 mg, 4 mg, Intravenous, Q6H PRN, Judeth Horn, MD .  oxyCODONE (ROXICODONE) 5 MG/5ML solution 5-10 mg, 5-10 mg, Per Tube, Q4H PRN, Judeth Horn, MD, 10 mg at 12/13/17 0622 .  pantoprazole sodium (PROTONIX) 40 mg/20 mL oral suspension 40 mg, 40 mg, Per Tube, Daily, Judeth Horn, MD, 40 mg at 12/12/17 1148 .  polyvinyl alcohol (LIQUIFILM TEARS) 1.4 % ophthalmic solution 2 drop, 2 drop, Right Eye, Q4H, Judeth Horn, MD, 2 drop at 12/12/17 2330 .  prazosin (MINIPRESS) capsule 1 mg, 1 mg, Oral, QHS, Judeth Horn, MD, 1 mg at 12/12/17 2332  Patients Current Diet:     Diet Order                  Diet full liquid Room service appropriate? 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  Home Assistive  Devices / Equipment Home Assistive Devices/Equipment: None  Prior Device Use: Indicate devices/aids used by the patient prior to current illness, exacerbation or injury? None  Prior Functional Level Prior Function Level of Independence: Independent Comments: works as Acupuncturist; 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  Self Care: Did the patient need help bathing, dressing, using the toilet or eating?  Independent  Indoor Mobility: Did the patient need assistance with walking from room to room (with or without device)? Independent  Stairs: Did the patient need assistance with internal or external stairs (with or without device)? Independent  Functional Cognition: Did the patient need help planning regular tasks such as shopping or remembering to take medications? Independent  Current Functional Level Cognition  Arousal/Alertness: Awake/alert Overall Cognitive Status: Difficult to assess Difficult to assess due to: Tracheostomy Current Attention Level: Selective Orientation Level: Oriented X4 Following Commands: Follows one step commands consistently General Comments: pt intermittently agitated/easily frustrated initially, mostly due to pain, pt later apologetic in session for frustrations Attention: Alternating Alternating Attention: Impaired Alternating Attention Impairment: Verbal complex, Functional complex Memory: Impaired Memory Impairment: Retrieval deficit Awareness: Appears intact Problem Solving: Appears intact Executive Function: Reasoning Reasoning: Appears intact    Extremity Assessment (includes Sensation/Coordination)  Upper Extremity Assessment: Generalized weakness(BUE edema; able ot move BUE spontaneously)  Lower Extremity Assessment: Defer to PT evaluation RLE Deficits / Details: Some weakness, but overall stronger than opposite 4/5 gross motions upon testing RLE Coordination: decreased fine motor, decreased gross motor LLE Deficits / Details: noted LLE strength  deficits 3-/5 gross  motions tested LLE: Unable to fully assess due to pain LLE Sensation: decreased light touch LLE Coordination: decreased fine motor, decreased gross motor    ADLs  Overall ADL's : Needs assistance/impaired Lower Body Dressing: Maximal assistance, Bed level Lower Body Dressing Details (indicate cue type and reason): pt's spouse assisting to don socks at bed level this session Functional mobility during ADLs: Minimal assistance, +2 for physical assistance, +2 for safety/equipment(for lateral/scoot transfer) General ADL Comments: pt with improvements in mobility/transfers this session; began education on plans to begin practicing standing as prep for further transfers/mobility within WB precautions/limitations; further educated on CIR process as pt hoping for CIR stay after discharge    Mobility  Overal bed mobility: Needs Assistance Bed Mobility: Supine to Sit Rolling: Mod assist, +2 for physical assistance Supine to sit: Mod assist, HOB elevated, +2 for safety/equipment Sit to supine: +2 for physical assistance, Max assist General bed mobility comments: pt overall able to support trunk and transition into upright sitting; assist for LLE over EOB and to lower to floor using pillow for increased LE support    Transfers  Overall transfer level: Needs assistance Equipment used: None Transfers: Lateral/Scoot Transfers  Lateral/Scoot Transfers: Min assist, +2 physical assistance, +2 safety/equipment General transfer comment: pt with good use of UEs to assist with lateral scoot to drop arm recliner; use of bed pad to assist with transfer/guiding hips    Ambulation / Gait / Stairs / Wheelchair Mobility  Ambulation/Gait General Gait Details: unable this date    Posture / Balance Dynamic Sitting Balance Sitting balance - Comments: pt able to maintain EOB sitting without physical assist, but using UEs for support, bracing pillow while coughing as coming to upright  sitting EOB made a productive cough.   Balance Overall balance assessment: Needs assistance Sitting-balance support: Single extremity supported, No upper extremity supported Sitting balance-Leahy Scale: Fair Sitting balance - Comments: pt able to maintain EOB sitting without physical assist, but using UEs for support, bracing pillow while coughing as coming to upright sitting EOB made a productive cough.      Special needs/care consideration BiPAP/CPAP No CPM No Continuous Drip IV No  Dialysis No       Life Vest No Oxygen Trach collar Special Bed No Trach Size Yes, #6 cuffless shiley Wound Vac (area) No   Skin Has dressing on abrasions B LE.  Sutures RLE, left hip and behind his right ear.                     Bowel mgmt: Last BM 12/11/17 Bladder mgmt: Using urinal Diabetic mgmt No    Previous Home Environment Living Arrangements: Spouse/significant other, Children  Lives With: Spouse Available Help at Discharge: Family Type of Home: House Home Layout: One level Home Access: Stairs to enter Entrance Stairs-Rails: None Technical brewer of Steps: 3 Bathroom Shower/Tub: Optometrist: No Home Care Services: No  Discharge Living Setting Plans for Discharge Living Setting: Patient's home, House, Lives with (comment)(Lives with wife and 3 children ages 12, 15 and 2) Type of Home at Discharge: House Discharge Home Layout: One level Discharge Home Access: Stairs to enter Entrance Stairs-Rails: None Entrance Stairs-Number of Steps: 3 steps at the front, no ramp, no rails Discharge Bathroom Shower/Tub: Tub/shower unit, Curtain Discharge Bathroom Toilet: Standard Does the patient have any problems obtaining your medications?: No  Social/Family/Support Systems Patient Roles: Spouse, Parent(Has a wife and children.) Contact Information: Michelle San Blevins - wife Anticipated Caregiver:  wife Anticipated Caregiver's Contact  Information: Sharyn Lull - wife - [phone] Ability/Limitations of Caregiver: Wife works from home and can assist. 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  Started on medication 12/12/17. Pt/Family Agrees to Admission and willing to participate: Yes Program Orientation Provided & Reviewed with Pt/Caregiver Including Roles  & Responsibilities: Yes  Decrease burden of Care through IP rehab admission: N/A  Possible need for SNF placement upon discharge: Not anticipated  Patient Condition: This patient's medical and functional status has changed since the consult dated: 12/06/17 in which the Rehabilitation Physician determined and documented that the patient's condition is appropriate for intensive rehabilitative care in an inpatient rehabilitation facility. See "History of Present Illness" (above) for medical update. Functional changes are: Currently requiring mod assist +2 for transfers. Patient's medical and functional status update has been discussed with the Rehabilitation physician and patient remains appropriate for inpatient rehabilitation. Will admit to inpatient rehab today.  Preadmission Screen Completed By:  Retta Diones, 12/13/2017 9:34 AM ______________________________________________________________________   Discussed status with Dr. Naaman Plummer on 12/13/17 at 933 and received telephone approval for admission today.  Admission Coordinator:  Retta Diones, time 934/Date 12/13/17           Cosigned by: Meredith Staggers, MD at 12/13/2017 9:58 AM  Revision History

## 2017-12-13 NOTE — PMR Pre-admission (Signed)
PMR Admission Coordinator Pre-Admission Assessment  Patient: Willie Blevins is an 42 y.o., male MRN: [id number] DOB: [birth year] Height: '6\' 2"'  (188 cm) Weight: 97.7 kg              Insurance Information HMO:    PPO:       PCP:       IPA:       80/20:       OTHER:   PRIMARY:  Generic workers comp      Policy#: [id number]      Subscriber: patient CM Name: Willie Blevins      Phone#: [phone]     Fax#: [phone] Pre-Cert#: [id number]      Employer:  [employer] Benefits:  Phone #:       Name:   Willie Blevins. Date: 11/29/17 date of injury     Deduct:        Out of Pocket Max:        Life Max:   CIR: Approved       SNF:   Outpatient:       Co-Pay:   Home Health:        Co-Pay:   DME:       Co-Pay:   Providers:    Medicaid Application Date:        Case Manager:   Disability Application Date:        Case Worker:    Emergency Contact Information Contact Information    Name Relation Home Work Mobile   Willie Blevins,Willie Blevins Spouse [phone]       Current Medical History  Patient Admitting Diagnosis:  TBI with polytrauma  History of Present Illness: A 42 y.o. male with history of emphysema who was admitted on 11/29/2017 after being struck by a tree at work. History taken from chart review and wife. 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  CT head reviewed, unremarkable for acute intracranial process. He was intubated in ED and taken to the OR for repair of lacerations as well as IM nailing left femoral shaft and I&D open fibular fracture with placement of wound VAC.  Dr. Barry Dienes  consulted for input on facial fractures and felt that patient would likely require an MMT as well as tracheostomy.  He was taken back to the OR on 10/17 for ORIF mandibular fracture with tracheostomy by Dr. Janace Hoard and dressing change with I&D with excision of muscle and fat right lower extremity by Dr. Marcelino Scot. Post op with increase in oral secretions with fevers and treated with IV antibiotics for aspiration PNA.   He was taken to the OR on 10/21 for right canal tympanomastoidectomy with exploration and  partial decompression of right facial nerve, removal of fracture/dislocated incus from middle fossa and repair of CSF leak by Dr. Thornell Mule. He is to be PWB LLE and WBAT on RLE with Cam boot and h recommendations to continue range of motion bilateral lower extremities.  Rhabdomyolysis due to crush injuries treated with hydration and currently on tube feeds for nutritional support.  He had difficulty with vent wean Friday due to mucous plugging. Propofol d/c this am and started on weaning today.  PT evaluation  initiated yesterday and CIR recommended due to significant deficits.  #6 cuffed shiley trach was downsized to #6 cuffless shiley on 12/12/17.  Diet has progressed to full liquids.  Up on chair 12/12/17 and tolerating activity well.  Will admit to inpatient rehab program today.    Past Medical History  Past Medical History:  Diagnosis Date  . Bullous emphysema ([place])   . COPD (chronic obstructive pulmonary disease) ([place])   . GERD (gastroesophageal reflux disease)   . Open right fibular fracture 12/02/2017  . Traumatic iritis     Family History  family history includes Bell's palsy in his mother; Diabetes in his maternal uncle, mother, and paternal uncle.  Prior Rehab/Hospitalizations: Had a bulla burst in his lung about a year ago and it was treated.  Has the patient had major surgery during 100 days prior to admission? No  Current Medications   Current Facility-Administered Medications:  .   0.9 %  sodium chloride infusion, , Intravenous, Continuous, Judeth Horn, MD, Last Rate: 10 mL/hr at 12/10/17 0600 .  acetaminophen (TYLENOL) solution 1,000 mg, 1,000 mg, Per Tube, Q8H, Judeth Horn, MD, 1,000 mg at 12/13/17 0814 .  artificial tears (LACRILUBE) ophthalmic ointment, , Right Eye, QHS,MR X 1, Judeth Horn, MD .  ascorbic acid (VITAMIN C) 500 MG/5ML syrup 1,000 mg, 1,000 mg, Per Tube, Daily, Judeth Horn, MD, 1,000 mg at 12/12/17 1147 .  bethanechol (URECHOLINE) tablet 25 mg, 25 mg, Per Tube, TID, Judeth Horn, MD, 25 mg at 12/12/17 2331 .  bisacodyl (DULCOLAX) suppository 10 mg, 10 mg, Rectal, Daily PRN, Judeth Horn, MD .  cefTRIAXone (ROCEPHIN) 2 g in sodium chloride 0.9 % 100 mL IVPB, 2 g, Intravenous, Q24H, Judeth Horn, MD, Last Rate: 200 mL/hr at 12/12/17 1157, 2 g at 12/12/17 1157 .  chlorhexidine gluconate (MEDLINE KIT) (PERIDEX) 0.12 % solution 15 mL, 15 mL, Mouth Rinse, BID, Judeth Horn, MD, 15 mL at 12/12/17 2051 .  ciprofloxacin-dexamethasone (CIPRODEX) 0.3-0.1 % OTIC (EAR) suspension 3 drop, 3 drop, Right EAR, TID, Judeth Horn, MD, 3 drop at 12/12/17 2052 .  enoxaparin (LOVENOX) injection 40 mg, 40 mg, Subcutaneous, Q24H, Judeth Horn, MD, 40 mg at 12/12/17 1147 .  gabapentin (NEURONTIN) 250 MG/5ML solution 400 mg, 400 mg, Per Tube, Q8H, Judeth Horn, MD, 400 mg at 12/13/17 [id number] .  HYDROmorphone (DILAUDID) injection 0.5-1 mg, 0.5-1 mg, Intravenous, Q2H PRN, Judeth Horn, MD, 1 mg at 12/13/17 5631 .  hydrOXYzine (ATARAX/VISTARIL) tablet 25-50 mg, 25-50 mg, Oral, TID PRN, Judeth Horn, MD .  LORazepam (ATIVAN) injection 0.5-1 mg, 0.5-1 mg, Intravenous, Q8H PRN, Judeth Horn, MD, 1 mg at 12/10/17 2304 .  MEDLINE mouth rinse, 15 mL, Mouth Rinse, Q4H, Judeth Horn, MD, 15 mL at 12/12/17 2331 .  methocarbamol (ROBAXIN) tablet 1,000 mg, 1,000 mg, Per Tube, TID, Judeth Horn, MD, 1,000 mg at 12/12/17 2331 .  metoprolol tartrate (LOPRESSOR) injection 5 mg, 5 mg, Intravenous, Q6H  PRN, Judeth Horn, MD .  ondansetron (ZOFRAN-ODT) disintegrating tablet 4 mg, 4 mg, Oral, Q6H PRN **OR** ondansetron (ZOFRAN) injection 4 mg, 4 mg, Intravenous, Q6H PRN, Judeth Horn, MD .  oxyCODONE (ROXICODONE) 5 MG/5ML solution 5-10 mg, 5-10 mg, Per Tube, Q4H PRN, Judeth Horn, MD, 10 mg at 12/13/17 0622 .  pantoprazole sodium (PROTONIX) 40 mg/20 mL oral suspension 40 mg, 40 mg, Per Tube, Daily, Judeth Horn, MD, 40 mg at 12/12/17 1148 .  polyvinyl alcohol (LIQUIFILM TEARS) 1.4 % ophthalmic solution 2 drop, 2 drop, Right Eye, Q4H, [place],  Jeneen Rinks, MD, 2 drop at 12/12/17 2330 .  prazosin (MINIPRESS) capsule 1 mg, 1 mg, Oral, QHS, Judeth Horn, MD, 1 mg at 12/12/17 2332  Patients Current Diet:  Diet Order            Diet full liquid Room service appropriate? 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  Home Assistive Devices / Equipment Home Assistive Devices/Equipment: None  Prior Device Use: Indicate devices/aids used by the patient prior to current illness, exacerbation or injury? None  Prior Functional Level Prior Function Level of Independence: Independent Comments: works as Acupuncturist; 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  Self Care: Did the patient need help bathing, dressing, using the toilet or eating?  Independent  Indoor Mobility: Did the patient need assistance with walking from room to room (with or without device)? Independent  Stairs: Did the patient need assistance with internal or external stairs (with or without  device)? Independent  Functional Cognition: Did the patient need help planning regular tasks such as shopping or remembering to take medications? Independent  Current Functional Level Cognition  Arousal/Alertness: Awake/alert Overall Cognitive Status: Difficult to assess Difficult to assess due to: Tracheostomy Current Attention Level: Selective Orientation Level: Oriented X4 Following Commands: Follows one step commands consistently General Comments: pt intermittently agitated/easily frustrated initially, mostly due to pain, pt later apologetic in session for frustrations Attention: Alternating Alternating Attention: Impaired Alternating Attention Impairment: Verbal complex, Functional complex Memory: Impaired Memory Impairment: Retrieval deficit Awareness: Appears intact Problem Solving: Appears intact Executive Function: Reasoning Reasoning: Appears intact    Extremity Assessment (includes Sensation/Coordination)  Upper Extremity Assessment: Generalized weakness(BUE edema; able ot move BUE spontaneously)  Lower Extremity Assessment: Defer to PT evaluation RLE Deficits / Details: Some weakness, but overall stronger than opposite 4/5 gross motions upon testing RLE Coordination: decreased fine motor, decreased gross motor LLE Deficits / Details: noted LLE strength deficits 3-/5 gross motions tested LLE: Unable to fully assess due to pain LLE Sensation: decreased light touch LLE Coordination: decreased fine motor, decreased gross motor    ADLs  Overall ADL's : Needs assistance/impaired Lower Body Dressing: Maximal assistance, Bed level Lower Body Dressing Details (indicate cue type and reason): pt's spouse assisting to don socks at bed level this session Functional mobility during ADLs: Minimal assistance, +2 for physical assistance, +2 for safety/equipment(for lateral/scoot transfer) General ADL Comments: pt with improvements in mobility/transfers this session; began education  on plans to begin practicing standing as prep for further transfers/mobility within WB precautions/limitations; further educated on CIR process as pt hoping for CIR stay after discharge    Mobility  Overal bed mobility: Needs Assistance Bed Mobility: Supine to Sit Rolling: Mod assist, +2 for physical assistance Supine to sit: Mod assist, HOB elevated, +2 for safety/equipment Sit to supine: +2 for physical assistance, Max assist General bed mobility comments: pt overall able to support trunk and transition into upright sitting; assist for LLE over EOB and to lower to floor using pillow for increased LE support    Transfers  Overall transfer level:  Needs assistance Equipment used: None Transfers: Lateral/Scoot Transfers  Lateral/Scoot Transfers: Min assist, +2 physical assistance, +2 safety/equipment General transfer comment: pt with good use of UEs to assist with lateral scoot to drop arm recliner; use of bed pad to assist with transfer/guiding hips    Ambulation / Gait / Stairs / Wheelchair Mobility  Ambulation/Gait General Gait Details: unable this date    Posture / Balance Dynamic Sitting Balance Sitting balance - Comments: pt able to maintain EOB sitting without physical assist, but using UEs for support, bracing pillow while coughing as coming to upright sitting EOB made a productive cough.   Balance Overall balance assessment: Needs assistance Sitting-balance support: Single extremity supported, No upper extremity supported Sitting balance-Leahy Scale: Fair Sitting balance - Comments: pt able to maintain EOB sitting without physical assist, but using UEs for support, bracing pillow while coughing as coming to upright sitting EOB made a productive cough.      Special needs/care consideration BiPAP/CPAP No CPM No Continuous Drip IV No  Dialysis No       Life Vest No Oxygen Trach collar Special Bed No Trach Size Yes, #6 cuffless shiley Wound Vac (area) No   Skin Has dressing  on abrasions B LE.  Sutures RLE, left hip and behind his right ear.                     Bowel mgmt: Last BM 12/11/17 Bladder mgmt: Using urinal Diabetic mgmt No    Previous Home Environment Living Arrangements: Spouse/significant other, Children  Lives With: Spouse Available Help at Discharge: Family Type of Home: House Home Layout: One level Home Access: Stairs to enter Entrance Stairs-Rails: None Technical brewer of Steps: 3 Bathroom Shower/Tub: Optometrist: No Home Care Services: No  Discharge Living Setting Plans for Discharge Living Setting: Patient's home, House, Lives with (comment)(Lives with wife and 3 children ages 7, 15 and 2) Type of Home at Discharge: House Discharge Home Layout: One level Discharge Home Access: Stairs to enter Entrance Stairs-Rails: None Entrance Stairs-Number of Steps: 3 steps at the front, no ramp, no rails Discharge Bathroom Shower/Tub: Tub/shower unit, Curtain Discharge Bathroom Toilet: Standard Does the patient have any problems obtaining your medications?: No  Social/Family/Support Systems Patient Roles: Spouse, Parent(Has a wife and children.) Contact Information: Willie Blevins - wife Anticipated Caregiver: wife Anticipated Caregiver's Contact Information: Willie Blevins - wife - [phone] Ability/Limitations of Caregiver: Wife works from home and can assist. 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  Started on medication  12/12/17. Pt/Family Agrees to Admission and willing to participate: Yes Program Orientation Provided & Reviewed with Pt/Caregiver Including Roles  & Responsibilities: Yes  Decrease burden of Care through IP rehab admission: N/A  Possible need for SNF placement upon discharge: Not anticipated  Patient Condition: This patient's medical and functional status has changed since the consult dated: 12/06/17 in which the Rehabilitation Physician determined and documented that the patient's condition is appropriate for intensive rehabilitative care in an inpatient rehabilitation facility. See "History of Present Illness" (above) for medical update. Functional changes are: Currently requiring mod assist +2  for transfers. Patient's medical and functional status update has been discussed with the Rehabilitation physician and patient remains appropriate for inpatient rehabilitation. Will admit to inpatient rehab today.  Preadmission Screen Completed By:  Retta Diones, 12/13/2017 9:34 AM ______________________________________________________________________   Discussed status with Dr. Naaman Plummer on 12/13/17 at 933 and received telephone approval for admission today.  Admission Coordinator:  Retta Diones, time 934/Date 12/13/17

## 2017-12-13 NOTE — Progress Notes (Signed)
Marcello Fennel, MD  Physician  Physical Medicine and Rehabilitation  Consult Note  Signed  Date of Service:  12/06/2017 8:42 AM       Related encounter: ED to Hosp-Admission (Discharged) from 11/29/2017 in [facility]      Signed      Expand All Collapse All    Show:Clear all [x] Manual[x] Template[] Copied  Added by: [x] Love, Evlyn Kanner, PA-C[x] Marcello Fennel, MD  [] Hover for details      Physical Medicine and Rehabilitation Consult   Reason for Consult: Polytrauma Referring Physician: Dr.    HPI: Willie Blevins is a 42 y.o. male with history of emphysema who was admitted on 11/29/2017 after being struck by a tree at work. History taken from chart review and wife. 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  CT head reviewed, unremarkable for acute intracranial process. He was intubated in ED and taken to the OR for repair of lacerations as well as IM nailing left femoral shaft and I&D open fibular fracture with placement of wound VAC.  Dr. Donell Beers consulted for input on facial fractures and felt that patient would likely require an MMT as well as tracheostomy.  He was taken back to the OR on 10/17 for ORIF mandibular fracture with tracheostomy by Dr. Jearld Fenton and dressing change with I&D with excision of muscle and fat right lower extremity by Dr. Carola Frost. Post op with increase in oral secretions with fevers and treated with IV antibiotics for aspiration PNA.   He was taken to the OR on 10/21 for right canal  tympanomastoidectomy with exploration and  partial decompression of right facial nerve, removal of fracture/dislocated incus from middle fossa and repair of CSF leak by Dr. Dorma Russell. He is to be PWB LLE and WBAT on RLE with Cam boot and h recommendations to continue range of motion bilateral lower extremities.  Rhabdomyolysis due to crush injuries treated with hydration and currently on tube feeds for nutritional support.  He had difficulty with vent wean Friday due to mucous plugging. Propofol d/c this am and started on weaning today.  PT evaluation initiated yesterday and CIR recommended due to significant deficits.    Review of Systems  Unable to perform ROS: Medical condition         Past Medical History:  Diagnosis Date  . Emphysema lung (HCC)   . Open right fibular fracture 12/02/2017         Past Surgical History:  Procedure Laterality Date  . APPLICATION OF WOUND VAC Right 11/29/2017   Procedure: APPLICATION OF WOUND VAC;  Surgeon: Myrene Galas, MD;  Location: [facility] OR;  Service: Orthopedics;  Laterality: Right;  . FACIAL LACERATION REPAIR Left 11/29/2017   Procedure: ear LACERATION REPAIR;  Surgeon: Violeta Gelinas, MD;  Location: [facility] OR;  Service: General;  Laterality: Left;  . I&D EXTREMITY Right 11/29/2017   Procedure: IRRIGATION AND DEBRIDEMENT EXTREMITY;  Surgeon: Myrene Galas, MD;  Location: [facility] OR;  Service: Orthopedics;  Laterality: Right;  . I&D EXTREMITY Right 12/01/2017   Procedure: IRRIGATION AND DEBRIDEMENT RIGHT LOWER EXTREMITY AND VAC CHANGE;  Surgeon: Myrene Galas, MD;  Location: [facility] OR;  Service: Orthopedics;  Laterality: Right;  . INTRAMEDULLARY (IM) NAIL INTERTROCHANTERIC Left 11/29/2017   Procedure: INTRAMEDULLARY (IM) NAIL INTERTROCHANTRIC;  Surgeon: Myrene Galas, MD;  Location: [facility] OR;  Service: Orthopedics;  Laterality: Left;  . ORIF MANDIBULAR FRACTURE N/A 12/01/2017   Procedure: OPEN REDUCTION INTERNAL FIXATION (ORIF) MANDIBULAR FRACTURE;   Surgeon: Suzanna Obey, MD;  Location: [facility] OR;  Service: ENT;  Laterality: N/A;  . TRACHEOSTOMY TUBE PLACEMENT N/A 12/01/2017   Procedure: TRACHEOSTOMY;  Surgeon: Suzanna Obey, MD;  Location: [facility] OR;  Service: ENT;  Laterality: N/A;          Family History  Problem Relation Age of Onset  . Diabetes Mother   . Bell's palsy Mother   . Diabetes Maternal Uncle   . Diabetes Paternal Uncle     Social History:  Patient vented and no family in room. Per records review--has 54 pack year smoking history. Quit Jan 2019. History of ETOH use. No drug history on file.          Allergies  Allergen Reactions  . Penicillins Hives    Has patient had a PCN reaction causing immediate rash, facial/tongue/throat swelling, SOB or lightheadedness with hypotension: Yes Has patient had a PCN reaction causing severe rash involving mucus membranes or skin necrosis: No Has patient had a PCN reaction that required hospitalization: No Has patient had a PCN reaction occurring within the last 10 years: No If all of the above answers are "NO", then may proceed with Cephalosporin use.     No medications prior to admission.    Home: Home Living Family/patient expects to be discharged to:: Inpatient rehab Living Arrangements: Spouse/significant other, Children Available Help at Discharge: Family Type of Home: House Home Access: Stairs to enter Secretary/administrator of Steps: 3 Entrance Stairs-Rails: None Home Layout: One level Bathroom Shower/Tub: Armed forces operational officer Accessibility: No  Functional History: Prior Function Level of Independence: Independent Functional Status:  Mobility: Bed Mobility Overal bed mobility: Needs Assistance Bed Mobility: Rolling Rolling: +2 for physical assistance, Max assist General bed mobility comments: audiologist came to evaluate pt's hearing prior to surgery this morning limiting ability to get upt to EOB Transfers General  transfer comment: unable this date  ADL:  Cognition: Cognition Overall Cognitive Status: Within Functional Limits for tasks assessed Orientation Level: Intubated/Tracheostomy - Unable to assess Cognition Arousal/Alertness: Awake/alert Behavior During Therapy: Anxious Overall Cognitive Status: Within Functional Limits for tasks assessed Area of Impairment: Orientation, Attention, Memory, Following commands, Safety/judgement, Awareness, Problem solving Current Attention Level: Selective Following Commands: Follows one step commands consistently, Follows one step commands with increased time, Follows multi-step commands with increased time Awareness: Emergent Problem Solving: Requires verbal cues, Requires tactile cues General Comments: pt extremely anxious regarding movement due to pain. pt agreeable with max encouragement however pt wrote on paper several time "just put me to sleep" wife present to help motivate   Blood pressure 115/86, pulse (!) 104, temperature 97.8 F (36.6 C), temperature source Axillary, resp. rate (!) 24, height 6\' 2"  (1.88 m), weight 113.7 kg, SpO2 92 %. Physical Exam  Nursing note and vitals reviewed. Constitutional: He appears well-developed and well-nourished.  HENT:  Facial edema and abrasions  Eyes: Right eye exhibits no discharge. Left eye exhibits no discharge.  Facial edema with periorbital ecchymosis. Eyes closed  Neck:  Trach in place  Cardiovascular: Normal rate and regular rhythm.  Respiratory: Effort normal.  Upper airway sounds  GI: Soft. Bowel sounds are normal.  Musculoskeletal:  LE  edema  Neurological: He is alert.  Nonverbal Significant apraxia, limiting MMT, however, RUE/RLE 1+/5 proximal to distal LUE: 1/5 proximal to distal LLE: 0/5 proximal to distal  Skin:  See above BLE with dressing c/d/i  Psychiatric:  Unable to assess due to mentation          Assessment/Plan: Diagnosis: TBI with polytrauma Labs and images  (see above) independently reviewed.  Records reviewed and summated above.             [place] score:  ?IV             Speech to evaluate for Post traumatic amnesia and interval GOAT scores to assess progress.             NeuroPsych evaluation for behavorial assessment.             Provide environmental management by reducing the level of stimulation, tolerating restlessness when possible, protecting patient from harming self or others and reducing patient's cognitive confusion.             Address behavioral concerns include providing structured environments and daily routines.             Cognitive therapy to direct modular abilities in order to maintain goals        including problem solving, self regulation/monitoring, self management, attention, and memory.             Fall precautions; pt at risk for second impact syndrome             Prevention of secondary injury: monitor for hypotension, hypoxia, seizures or signs of increased ICP             Prophylactic AED:              Consider pharmacological intervention if necessary with neurostimulants,  Such as amantadine, methylphenidate, modafinil, etc.             Consider Propranolol for agitation and storming             Avoid medications that could impair cognitive abilities, such as anticholinergics, antihistaminic, benzodiazapines, narcotics, etc when possible  1. Does the need for close, 24 hr/day medical supervision in concert with the patient's rehab needs make it unreasonable for this patient to be served in a less intensive setting? Yes  2. Co-Morbidities requiring supervision/potential complications: emphysema (monitor RR and O2 sats with increased exertion, wean trach as tolerated), hyperglycemia (Monitor in accordance with exercise and adjust meds as necessary), ID (wean IV as appropriate, including IV Vanc), post-op pain (Biofeedback training with therapies to help reduce reliance on opiate pain medications, particularly  IV fentanyl and dilaudid monitor pain control during therapies, and sedation at rest and titrate to maximum efficacy to ensure participation and gains in therapies), wean sedative meds as tolerated, hyponatremia (serial labs, treat as necessary) 3. Due to bladder management, bowel management, safety, skin/wound care, disease management, medication administration, pain management and patient education, does the patient require 24 hr/day rehab nursing? Yes 4. Does the patient require coordinated care of a physician, rehab nurse, PT (1-2 hrs/day, 5 days/week), OT (1-2 hrs/day, 5 days/week) and SLP (1-2 hrs/day, 5 days/week) to address physical and functional deficits in the context of the above medical diagnosis(es)? Yes Addressing deficits in the following areas: balance, endurance, locomotion, strength, transferring, bowel/bladder control, bathing, dressing, feeding, grooming, toileting, cognition, speech, language, swallowing and psychosocial support 5. Can the patient actively participate in an intensive therapy program of at  least 3 hrs of therapy per day at least 5 days per week? In the future 6. The potential for patient to make measurable gains while on inpatient rehab is excellent 7. Anticipated functional outcomes upon discharge from inpatient rehab are min assist  with PT, min assist with OT, min assist with SLP. 8. Estimated rehab length of stay to reach the above functional goals is: 30-34 days. 9. Anticipated D/C setting: Home 10. Anticipated post D/C treatments: HH therapy and Home excercise program 11. Overall Rehab/Functional Prognosis: good  RECOMMENDATIONS: This patient's condition is appropriate for continued rehabilitative care in the following setting: CIR when medically stable and able to tolerate 3 hours of therapy/day. Patient has agreed to participate in recommended program. Potentially Note that insurance prior authorization may be required for reimbursement for recommended  care.  Comment: Rehab Admissions Coordinator to follow up.   I have personally performed a face to face diagnostic evaluation, including, but not limited to relevant history and physical exam findings, of this patient and developed relevant assessment and plan.  Additionally, I have reviewed and concur with the physician assistant's documentation above.   Maryla Morrow, MD, ABPMR Jacquelynn Cree, PA-C 12/06/2017        Revision History                        Routing History

## 2017-12-13 NOTE — Progress Notes (Signed)
Calorie Count Note  48-hour calorie count ordered and will start today with dinner meal. Calorie count envelope on pt's door. Discussed with RN and PA.  Please see assessment note dated 10/29 for RD initial nutrition assessment.  Diet: Full Liquid Supplements: Ensure Enlive TID, each supplement provides 350 kcal and 20 grams of protein  Nutrition Diagnosis: Increased nutrient needs related to post-op healing, other (therapies) as evidenced by estimated needs.  Goal: Patient will meet greater than or equal to 90% of their needs  Intervention: - Ensure Enlive po TID, each supplement provides 350 kcal and 20 grams of protein - Nocturnal tube feeding via Cortrak: Pivot 1.5 @ 85 ml/hr over 14 hours (1800 to 0800) to provide 1785 kcal, 112 grams of protein, and 904 ml free water - 48-hour calorie count   Earma Reading, MS, RD, LDN Inpatient Clinical Dietitian Pager: [phone] Weekend/After Hours: [phone]

## 2017-12-13 NOTE — Progress Notes (Signed)
Initial Nutrition Assessment  DOCUMENTATION CODES:   Not applicable (will assess for malnutrition at follow-up after completion of NFPE, deferred at this time due to pain)  INTERVENTION:   Initiate nocturnal tube feeding via Cortrak: - Pivot 1.5 @ 85 ml/hr over 14 hours (1800 to 0800) - free water per MD  Tube feeding regimen provides 1785 kcal, 112 grams of protein, and 904 ml of H2O (71% of minimum kcal needs, 83% of minimum protein needs).  - Continue Ensure Enlive po TID, each supplement provides 350 kcal and 20 grams of protein  - 48-hour calorie count (see calorie count note to follow)  NUTRITION DIAGNOSIS:   Increased nutrient needs related to post-op healing, other (therapies) as evidenced by estimated needs.  GOAL:   Patient will meet greater than or equal to 90% of their needs  MONITOR:   PO intake, Supplement acceptance, Weight trends, Diet advancement, Skin, TF tolerance, Labs  REASON FOR ASSESSMENT:   Consult Enteral/tube feeding initiation and management  ASSESSMENT:   42 year old pt who was admitted on 11/29/17 after being struck by a tree. Pt sustained multiple injuries including right ear lacerations s/p repair, right orbit and maxillary sinus fractures, right mastoid fracture s/p wired jaw, nasal septum fracture, grade 1 liver laceration, grade 2 spleen laceration, right adrenal hemorrhage, left sub-trochanteric femur fracture s/p IMN 10/17, right open fibula fracture s/p I&D, and left ankle sprain.  Significant Events: 10/17 - trach 10/18 - Cortrak, tip in stomach per x-ray 10/23 - Cortrak replaced 10/25 - diet advanced to full liquids, Cortrak removed 10/28 - Cortrak replaced, tip post-pyloric 10/29 - admitted to CIR  Pt downsized to CFS #6 on 10/28 and is now tolerating button plugging x 24 hours.  Spoke with pt and wife at bedside. RN in room providing nursing care at time of visit. Pt reports pain is currently 9/10.  Pt reports that he will  be getting the wires on his jaw clipped either tomorrow or Thursday. Pt's wife states that she thought that this would be done today but that the focus was on pt transferring to CIR. Noted plan for removal of mandibular wires on Thursday.  Per chart review, pt was receiving and tolerating continuous tube feedings prior to admission to CIR: Pivot 1.5 @ 70 ml/hr over 24 hours. Will continue to use Pivot 1.5 formula given multiple fractures and TBI.  Pt's wife with questions regarding appropriate liquids to administer via Cortrak tube. Pt's wife asking whether she can administer HerbaLife powder/shakes and Boost Breeze oral nutrition supplements via Cortrak. RD provided education regarding only tube feeding formula, free water, and appropriately crushed medications can be administered via Cortrak. Also discussed small bore of Cortrak (10 french) and post-pyloric tip location.  RD encouraged pt to take full liquids via mouth. Pt states that it is difficult to use a straw but has found that a syringe to administer liquids via mouth "works better." Pt is looking forward to not having his jaw wired and hopes this will help him take more via mouth. Pt's wife reports that it typically takes pt an entire day to finish 1 oral nutrition supplement.  Discussed nocturnal tube feeds with pt, pt's wife, RN, and PA. Agreed on plan to meet the majority of pt's estimated needs via nocturnal tube feeds and allow pt to take POs to meet remainder. Discussed how nocturnal tube feeds will hopefully allow pt to feel hunger throughout the day which will promote increased PO intake.  RD will order tube  feeding to run over 14 hours (1800-0800).  Medications reviewed and include: vitamin C 1000 mg daily, Ensure Enlive TID, Protonix 40 mg daily, IV antibiotics  Labs reviewed.  NUTRITION - FOCUSED PHYSICAL EXAM:  Deferred, will complete at follow-up as pt reports 9/10 pain.  Diet Order:   Diet Order            Diet full  liquid Room service appropriate? Yes; Fluid consistency: Thin  Diet effective now              EDUCATION NEEDS:   Education needs have been addressed  Skin:  Skin Assessment: Skin Integrity Issues: Incisions: neck, right leg, left leg, right ear   Last BM:  10/27 (large type 7)  Height:   Ht Readings from Last 1 Encounters:  12/13/17 6\' 2"  (1.88 m)    Weight:   Wt Readings from Last 1 Encounters:  12/13/17 97.9 kg    Ideal Body Weight:  86.36 kg  BMI:  Body mass index is 27.71 kg/m.  Estimated Nutritional Needs:   Kcal:  2500-2700  Protein:  135-160 grams  Fluid:  >/= 2.0 L    Earma Reading, MS, RD, LDN Inpatient Clinical Dietitian Pager: [phone] Weekend/After Hours: [phone]

## 2017-12-13 NOTE — Progress Notes (Signed)
[facility] Progress Note  8 Days Post-Op  Subjective: CC: pain Patient reports pain is still an issue but improving somewhat, he is taking multimodal pain medication now that he has the Cortrak again rather than just IV pain medication. LLE is what hurts him the most. Tolerated trach change well. Less nightmares last night, but still unable to sleep well secondary to pain.   Objective: Vital signs in last 24 hours: Temp:  [98.5 F (36.9 C)-99 F (37.2 C)] 98.8 F (37.1 C) (10/29 0456) Pulse Rate:  [82-107] 101 (10/29 0510) Resp:  [16-20] 16 (10/29 0510) BP: (117-123)/(69-73) 119/71 (10/29 0456) SpO2:  [92 %-100 %] 96 % (10/29 0510) FiO2 (%):  [28 %] 28 % (10/28 1205) Weight:  [97.7 kg] 97.7 kg (10/28 1021) Last BM Date: 12/11/17  Intake/Output from previous day: 10/28 0701 - 10/29 0700 In: -  Out: 1100 [Urine:1100] Intake/Output this shift: No intake/output data recorded.  PE: Gen:  Alert, NAD, pleasant ENT: jaws wired, vaseline gauze over R eye Neck: trach c/d/i Card:  Regular rate and rhythm, pedal pulses 2+ BL Pulm:  Normal effort, clear to auscultation bilaterally Abd: Soft, non-tender, non-distended, bowel sounds present  Ext: sensation/motor intact in feet bilaterally, mild edema in LE bilaterally Psych: A&Ox3   Lab Results:  Recent Labs    12/12/17 0224 12/13/17 0157  WBC 14.0* 14.3*  HGB 9.7* 10.1*  HCT 31.9* 32.5*  PLT 674* 696*   BMET No results for input(s): NA, K, CL, CO2, GLUCOSE, BUN, CREATININE, CALCIUM in the last 72 hours. PT/INR No results for input(s): LABPROT, INR in the last 72 hours. CMP     Component Value Date/Time   NA 136 12/10/2017 0656   K 4.9 12/10/2017 0656   CL 102 12/10/2017 0656   CO2 22 12/10/2017 0656   GLUCOSE 83 12/10/2017 0656   BUN 22 (H) 12/10/2017 0656   CREATININE 0.65 12/10/2017 0656   CALCIUM 8.3 (L) 12/10/2017 0656   PROT 5.8 (L) 11/29/2017 1121   ALBUMIN 2.3 (L) 12/02/2017 0511   AST 118 (H)  11/29/2017 1121   ALT 112 (H) 11/29/2017 1121   ALKPHOS 75 11/29/2017 1121   BILITOT 0.5 11/29/2017 1121   GFRNONAA >60 12/10/2017 0656   GFRAA >60 12/10/2017 0656   Lipase  No results found for: LIPASE     Studies/Results: No results found.  Anti-infectives: Anti-infectives (From admission, onward)   Start     Dose/Rate Route Frequency Ordered Stop   12/06/17 2000  ceFEPIme (MAXIPIME) 2 g in sodium chloride 0.9 % 100 mL IVPB  Status:  Discontinued     2 g 200 mL/hr over 30 Minutes Intravenous Every 12 hours 12/05/17 1841 12/06/17 1010   12/06/17 1100  cefTRIAXone (ROCEPHIN) 2 g in sodium chloride 0.9 % 100 mL IVPB     2 g 200 mL/hr over 30 Minutes Intravenous Every 24 hours 12/06/17 1010     12/05/17 1731  ceFEPIme (MAXIPIME) 2 g in sodium chloride 0.9 % 100 mL IVPB  Status:  Discontinued     2 g 200 mL/hr over 30 Minutes Intravenous Every 12 hours 12/05/17 1732 12/05/17 1841   12/05/17 1730  vancomycin (VANCOCIN) 1,250 mg in sodium chloride 0.9 % 250 mL IVPB  Status:  Discontinued     1,250 mg 166.7 mL/hr over 90 Minutes Intravenous Every 8 hours 12/05/17 1706 12/06/17 0954   12/05/17 1555  polymyxin B 500,000 Units, bacitracin 50,000 Units in sodium chloride 0.9 % 500  mL irrigation  Status:  Discontinued       As needed 12/05/17 1555 12/05/17 1647   12/05/17 1330  vancomycin (VANCOCIN) IVPB 1000 mg/200 mL premix     1,000 mg 200 mL/hr over 60 Minutes Intravenous To Surgery 12/05/17 1318 12/05/17 1420   12/03/17 1200  vancomycin (VANCOCIN) IVPB 1000 mg/200 mL premix  Status:  Discontinued     1,000 mg 200 mL/hr over 60 Minutes Intravenous Every 8 hours 12/03/17 1050 12/05/17 1706   12/03/17 1200  ceFEPIme (MAXIPIME) 2 g in sodium chloride 0.9 % 100 mL IVPB  Status:  Discontinued     2 g 200 mL/hr over 30 Minutes Intravenous Every 12 hours 12/03/17 1050 12/05/17 1732   12/01/17 1032  tobramycin (NEBCIN) powder  Status:  Discontinued       As needed 12/01/17 1032 12/01/17  1105   12/01/17 1031  vancomycin (VANCOCIN) powder  Status:  Discontinued       As needed 12/01/17 1031 12/01/17 1105   11/29/17 2000  ceFAZolin (ANCEF) IVPB 2g/100 mL premix     2 g 200 mL/hr over 30 Minutes Intravenous Every 8 hours 11/29/17 1617 12/01/17 1326   11/29/17 1334  vancomycin (VANCOCIN) powder  Status:  Discontinued       As needed 11/29/17 1335 11/29/17 1547   11/29/17 1327  tobramycin (NEBCIN) powder  Status:  Discontinued       As needed 11/29/17 1334 11/29/17 1547       Assessment/Plan Hit by tree L subtroch femur fx, crush injury left leg with fibula fx- s/p IMN Dr Carola Frost 10/15. Partal WB on left, WBAT Right R open fibula fx, crush injury right leg with extensive muscle injury - s/p I&D with wound vac application Dr Carola Frost 10/15, washout, retention suture closure and reapplication of wound vac 10/17 Acute hypoxic respiratory failure- resolved R ear lac- repair Dr. Elwyn Lade 10/15 (absorbable sutures) L rib #11 fx Grade 2 splenic lac Grade 1 liver lac R adrenal hemorrhage R maxillary sinus fx/ R lateral wall orbit fx/ R mastoid process fx/ Nasal septum fx - ORIF mandible fx and tracheostomy 10/17 Dr Jearld Fenton, possibly cut wires Thursday, downsized to #6 cuffless yesterday  Skull fracture without intracranial injury Significant temporal bone fracture with right facial nerve paralysis - S/P Right canal up tympanomastoidectomy with exploration of the right facial nerve, partial right facial nerve decompression, removal of a fracture dislocated right incus, repair ofCSF leak from right tegmen tympani, right type I medial fascia graft tympanoplasty by Dr. Dorma Russell 10/21 Cervical strain- CT cervical spine neg, flex ex negative 10/25 Nightmares/Acute stress reaction - minipress at night, prn atarax, psych consulted - could consider starting lexapro 10 mg daily if anxiety and sleep not improving in the next 7-10 days  ID- Maxipime 10/19>10/22, rocephin 10/22>>, WBC 14,  afebrile FEN- Cortrak to be placed today, FLD VTE- Lovenox, SCDs  Dispo- Continue therapies. Stable for discharge to CIR.   LOS: 14 days    Wells Guiles , [facility] 12/13/2017, 7:58 AM Pager: [phone] Mon-Fri 7:00 am-4:30 pm Sat-Sun 7:00 am-11:30 am

## 2017-12-13 NOTE — Progress Notes (Signed)
Patient ID: Willie Blevins, male   DOB: 06-20-1975, 42 y.o.   MRN: [id number] Admit to unit, oriented to unit, plan of care, orders and reha bschedule. States an understanding of information reviewed. Pamelia Hoit

## 2017-12-13 NOTE — H&P (Signed)
Physical Medicine and Rehabilitation Admission H&P        Chief Complaint  Patient presents with  . Trauma      HPI: Willie Blevins is a 42 year old male with history of emphysema who was admitted on 11/29/2017 after being struck by a tree at work.  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  CT head was unremarkable for acute intracranial process.  He was intubated in the ED and taken to the OR for repair of lacerations as well as IM nailing left femoral shaft and IND open fibula fracture with placement of wound VAC by Dr. Carola Frost.  Dr. Donell Beers consulted for input and felt that patient would require MMT as well as tracheostomy.   He was taken back to the OR on 10/17 for ORIF mandibular fracture with tracheostomy by Dr. Donell Beers and dressing change with I&D and excision of muscle and fat RLE.  He is to be PWB LLE and WBAT on RLE with Cam boot.  Aggressive range of motion BLE with recommendations of 30-day DVT prophylaxis with use of Lovenox.  Rhabdomyolysis due to crush injuries treated with IVF for hydration.  Postop with increase in oral secretions with fevers and treated with IV antibiotics for aspiration pneumonia.  He was taken back to the OR on 10/21 for right canal tympanomastoidectomy with exploration and partial decompression of right facial nerve, removal of fracture/dislocated incus from middle fossa and repair of CSF leak by Dr. Dorma Russell.  He was slow to wean off the ventilator due to mucous plugging as well as need of propofol for agitation.     He was extubated to ATC on 10/22  and is tolerating ATC.  He was downsized to CFS #6 yesterday and is tolerating button plugging X 24 hours.  Psychiatry consulted for input due to issues with anxiety and flashbacks as well as nightmares.  Prazosin added for nightmares with recommendations to use Atarax as needed or addition of Lexapro if latter ineffective.  Postauricular sutures removed today and he was noted to have suture abscess therefore Rocephin recommended by Dr. Dorma Russell.  Plans to have mandibular mandibular wires cut on Thursday per Dr. Jearld Fenton.  Patient on full liquid diet however easy fatigability with intake therefore cortak replaced today.  Patient with delayed processing with intermittent bouts of agitation, requires verbal tactile cues and able to follow one-step commands consistently, has significant deficits in mobility and ability to carry out ADLs. CIR recommended due to polytrauma with significant deficits     Review of Systems  Constitutional: Negative for chills and fever.  HENT: Negative for hearing loss and tinnitus.   Eyes: Positive for blurred vision (right. ).  Respiratory: Negative for cough and sputum production.   Cardiovascular: Positive for chest pain (left lower chest wall pain due to rib fractures) and leg swelling.  Gastrointestinal: Positive for diarrhea. Negative for constipation, heartburn and nausea.  Genitourinary: Positive for dysuria. Negative for frequency and urgency.  Musculoskeletal: Positive for myalgias.  Skin: Negative for itching and rash.  Neurological: Positive for sensory change (LLE and right face), speech change and weakness.  Psychiatric/Behavioral: The patient has  insomnia (but no nightmares last night).             Past Medical History:  Diagnosis Date  . Bullous emphysema (HCC)    . COPD (chronic obstructive pulmonary disease) (HCC)    . GERD (gastroesophageal reflux disease)    . Open right fibular fracture 12/02/2017  . Strabismus      with lazy eye (left)  .  Traumatic iritis           Past Surgical History:  Procedure Laterality Date  . APPLICATION OF WOUND VAC Right 11/29/2017    Procedure: APPLICATION OF WOUND VAC;  Surgeon: Myrene Galas, MD;  Location: [facility] OR;  Service: Orthopedics;  Laterality: Right;  . FACIAL LACERATION REPAIR Left 11/29/2017    Procedure: ear LACERATION REPAIR;  Surgeon: Violeta Gelinas, MD;  Location: [facility] OR;  Service: General;  Laterality: Left;  . I&D EXTREMITY Right 11/29/2017    Procedure: IRRIGATION AND DEBRIDEMENT EXTREMITY;  Surgeon: Myrene Galas, MD;  Location: [facility] OR;  Service: Orthopedics;  Laterality: Right;  . I&D EXTREMITY Right 12/01/2017    Procedure: IRRIGATION AND DEBRIDEMENT RIGHT LOWER EXTREMITY AND VAC CHANGE;  Surgeon: Myrene Galas, MD;  Location: [facility] OR;  Service: Orthopedics;  Laterality: Right;  . INTRAMEDULLARY (IM) NAIL INTERTROCHANTERIC Left 11/29/2017    Procedure: INTRAMEDULLARY (IM) NAIL INTERTROCHANTRIC;  Surgeon: Myrene Galas, MD;  Location: [facility] OR;  Service: Orthopedics;  Laterality: Left;  . ORIF MANDIBULAR FRACTURE N/A 12/01/2017    Procedure: OPEN REDUCTION INTERNAL FIXATION (ORIF) MANDIBULAR FRACTURE;  Surgeon: Suzanna Obey, MD;  Location: [facility] OR;  Service: ENT;  Laterality: N/A;  . TRACHEOSTOMY TUBE PLACEMENT N/A 12/01/2017    Procedure: TRACHEOSTOMY;  Surgeon: Suzanna Obey, MD;  Location: [facility] OR;  Service: ENT;  Laterality: N/A;  . TYMPANOMASTOIDECTOMY Right 12/05/2017    Procedure: right canal up tympanomastoid, right mastoidectomy, exploration right facial nerve, Type 1 tympanoplasty, CSF leak repair.;  Surgeon: Ermalinda Barrios, MD;  Location: [facility] OR;  Service: ENT;  Laterality: Right;         Family History  Problem Relation Age of Onset  . Diabetes Mother    . Bell's palsy Mother    . Diabetes Maternal Uncle    . Diabetes Paternal Uncle      Social History:  reports that he has never smoked. He has never used smokeless tobacco. His alcohol and drug histories are not on  file. Allergies:       Allergies  Allergen Reactions  . Penicillins Hives      Has patient had a PCN reaction causing immediate rash, facial/tongue/throat swelling, SOB or lightheadedness with hypotension: Yes Has patient had a PCN reaction causing severe rash involving mucus membranes or skin necrosis: No Has patient had a PCN reaction that required hospitalization: No Has patient had a PCN reaction occurring within the last 10 years: No If all of the above answers are "NO", then may proceed with Cephalosporin use.      No medications prior to admission.      Drug Regimen Review  Drug regimen was reviewed and remains appropriate with no significant issues identified   Home: Home Living Family/patient expects to be discharged to:: Inpatient rehab Living Arrangements: Spouse/significant other, Children Available Help at Discharge: Family Type of Home: House Home Access: Stairs to enter [employer] of Steps: 3 Entrance Stairs-Rails: None Home Layout: One level Bathroom Shower/Tub: Engineer, manufacturing systems: Standard Bathroom Accessibility: No  Lives With: Spouse   Functional History: Prior Function  Level of Independence: Independent Comments: works as Soil scientist; 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   Functional Status:  Mobility: Bed Mobility Overal bed mobility: Needs Assistance Bed Mobility: Supine to Sit Rolling: Mod assist, +2 for physical assistance Supine to sit: Mod assist, HOB elevated, +2 for safety/equipment Sit to supine: +2 for physical assistance, Max assist General bed mobility comments: pt overall able to support trunk and transition into upright sitting; assist for LLE over EOB and to lower to floor using pillow for increased LE support Transfers Overall transfer level: Needs assistance Equipment used: None Transfers: Lateral/Scoot Transfers  Lateral/Scoot Transfers: Min assist, +2 physical assistance, +2 safety/equipment General transfer  comment: pt with good use of UEs to assist with lateral scoot to drop arm recliner; use of bed pad to assist with transfer/guiding hips Ambulation/Gait General Gait Details: unable this date   ADL: ADL Overall ADL's : Needs assistance/impaired Lower Body Dressing: Maximal assistance, Bed level Lower Body Dressing Details (indicate cue type and reason): pt's spouse assisting to don socks at bed level this session Functional mobility during ADLs: Minimal assistance, +2 for physical assistance, +2 for safety/equipment(for lateral/scoot transfer) General ADL Comments: pt with improvements in mobility/transfers this session; began education on plans to begin practicing standing as prep for further transfers/mobility within WB precautions/limitations; further educated on CIR process as pt hoping for CIR stay after discharge   Cognition: Cognition Overall Cognitive Status: Difficult to assess Arousal/Alertness: Awake/alert Orientation Level: Oriented X4 Attention: Alternating Alternating Attention: Impaired Alternating Attention Impairment: Verbal complex, Functional complex Memory: Impaired Memory Impairment: Retrieval deficit Awareness: Appears intact Problem Solving: Appears intact Executive Function: Reasoning Reasoning: Appears intact Cognition Arousal/Alertness: Awake/alert Behavior During Therapy: Agitated, WFL for tasks assessed/performed(intermittent agitation due to pain (initially)) Overall Cognitive Status: Difficult to assess Area of Impairment: Following commands, Problem solving Current Attention Level: Selective Following Commands: Follows one step commands consistently Awareness: Emergent Problem Solving: Slow processing, Requires verbal cues, Requires tactile cues General Comments: pt intermittently agitated/easily frustrated initially, mostly due to pain, pt later apologetic in session for frustrations Difficult to assess due to: Tracheostomy     Blood pressure  119/71, pulse 97, temperature 98.8 F (37.1 C), temperature source Oral, resp. rate 16, height 6\' 2"  (1.88 m), weight 97.7 kg, SpO2 99 %. Physical Exam  Nursing note and vitals reviewed. Constitutional: He is oriented to person, place, and time. No distress.  HENT:  Jaws wired, bruises on face. Right eye taped  Eyes:  Right eye taped shut. Bilateral infraorbital ecchymosis resolving.   Neck:  Plugged trach in place. Moisture under trach dressing with occasional sounds of air loss.   Respiratory: No stridor. He exhibits tenderness (left lower rib area. ).  GI: He exhibits no distension. There is no tenderness.  Musculoskeletal:  1+ edema left thigh down to 2+ left foot. Multiple sutures LLE with scabbed abrasions on left lateral shin. Min edema right foot with dry dressing right shin.    Neurological: He is alert and oriented to person, place, and time.  Right facial weakness with slurred speech due to wires. Able to follow basic commands without difficulty. Right peripheral 7 with inability to close right eye lids and right facial droop. Fairly aware. Recalls accident. Follows basic commands. No language issues. Moves UE least 4/5. Lifts RLE easily against gravity. Can wiggle toes of both feet. Unable to lift left leg. Normal sensation  Skin: Skin is warm and dry. He is not diaphoretic.  Psychiatric: He has a normal mood and affect.  His behavior is normal.      Lab Results Last 48 Hours        Results for orders placed or performed during the hospital encounter of 11/29/17 (from the past 48 hour(s))  CBC with Differential/Platelet     Status: Abnormal    Collection Time: 12/12/17  2:24 AM  Result Value Ref Range    WBC 14.0 (H) 4.0 - 10.5 K/uL    RBC 3.52 (L) 4.22 - 5.81 MIL/uL    Hemoglobin 9.7 (L) 13.0 - 17.0 g/dL    HCT 16.1 (L) 09.6 - 52.0 %    MCV 90.6 80.0 - 100.0 fL    MCH 27.6 26.0 - 34.0 pg    MCHC 30.4 30.0 - 36.0 g/dL    RDW 04.5 40.9 - 81.1 %    Platelets 674 (H) 150 -  400 K/uL    nRBC 0.0 0.0 - 0.2 %    Neutrophils Relative % 65 %    Neutro Abs 9.2 (H) 1.7 - 7.7 K/uL    Lymphocytes Relative 18 %    Lymphs Abs 2.5 0.7 - 4.0 K/uL    Monocytes Relative 10 %    Monocytes Absolute 1.4 (H) 0.1 - 1.0 K/uL    Eosinophils Relative 4 %    Eosinophils Absolute 0.6 (H) 0.0 - 0.5 K/uL    Basophils Relative 0 %    Basophils Absolute 0.0 0.0 - 0.1 K/uL    Immature Granulocytes 3 %    Abs Immature Granulocytes 0.35 (H) 0.00 - 0.07 K/uL      Comment: Performed at [facility], [address] [place], [place] [address]  CBC     Status: Abnormal    Collection Time: 12/13/17  1:57 AM  Result Value Ref Range    WBC 14.3 (H) 4.0 - 10.5 K/uL    RBC 3.63 (L) 4.22 - 5.81 MIL/uL    Hemoglobin 10.1 (L) 13.0 - 17.0 g/dL    HCT 29.5 (L) 62.1 - 52.0 %    MCV 89.5 80.0 - 100.0 fL    MCH 27.8 26.0 - 34.0 pg    MCHC 31.1 30.0 - 36.0 g/dL    RDW 30.8 65.7 - 84.6 %    Platelets 696 (H) 150 - 400 K/uL    nRBC 0.0 0.0 - 0.2 %      Comment: Performed at [facility], [address] [place], [place] [address]      Imaging Results (Last 48 hours)  No results found.           Medical Problem List and Plan: 1.  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  DVT Prophylaxis/Anticoagulation: Pharmaceutical: Lovenox 3. Pain Management: Continue scheduled tylenol, gabapentin and robaxin. Oxycodone prn for severe pain.    4. Mood: LCSW to follow for evaluation and support.  5. Neuropsych: This patient is not fully capable of making decisions on his own behalf. 6. Skin/Wound Care: Monitor wounds for healing. Continue nutritional supplements to promote healing.  7. Fluids/Electrolytes/Nutrition: Monitor I/O. Check lytes in am. Calorie count. Start tube feeds.  8.  Acute hypoxic respiratory failure: Tolerating T collar.  Downsized to CFS# 6 on 10/28             -trach is capped 9. TBI with  skull fracture and right facial nerve paralysis: S/P right canal tympanomastoidectomy  with decompression of facial nerve and repair of CSF leak.  Will need audiology evaluation past discharge 10.  Right postauricular suture abscess: Continue Rocephin IV 2 g daily per ENT input 11.  Bilateral LLE crush injuries with left septic femur fracture and right open fibula fracture with extensive muscle injury: PWB on RLE and WBAT LLE.  12.  Right facial and mandible fracture status post ORIF mandible 10/17:  13.  Acute stress disorder with anxiety: Started on prazosin for nightmares.  Psychiatry recommends adding Lexapro if prazosin or Atarax ineffective for management of anxiety 14. Aspiration PNA: Leucocytosis resolving. Continue Rocephin added 10/22--D# 15. ABLA: Continue to monitor for recovery.   16. Rhabdomyolysis: CK improving 1815-->4575--> 1,963. Will add water flushes. Encourage fuid intake.  17. Abnormal LFTs: Recheck in am.    Post Admission Physician Evaluation: 1. Functional deficits secondary  to TBI with polytrauma. 2. Patient is admitted to receive collaborative, interdisciplinary care between the physiatrist, rehab nursing staff, and therapy team. 3. Patient's level of medical complexity and substantial therapy needs in context of that medical necessity cannot be provided at a lesser intensity of care such as a SNF. 4. Patient has experienced substantial functional loss from his/her baseline which was documented above under the "Functional History" and "Functional Status" headings.  Judging by the patient's diagnosis, physical exam, and functional history, the patient has potential for functional progress which will result in measurable gains while on inpatient rehab.  These gains will be of substantial and practical use upon discharge  in facilitating mobility and self-care at the household level. 5. Physiatrist will provide 24 hour management of medical needs as well as oversight of the  therapy plan/treatment and provide guidance as appropriate regarding the interaction of the two. 6. The Preadmission Screening has been reviewed and patient status is unchanged unless otherwise stated above. 7. 24 hour rehab nursing will assist with bladder management, bowel management, safety, skin/wound care, disease management, medication administration, pain management and patient education  and help integrate therapy concepts, techniques,education, etc. 8. PT will assess and treat for/with: Lower extremity strength, range of motion, stamina, balance, functional mobility, safety, adaptive techniques and equipment, NMR, ortho precautions, pain control.   Goals are: supervision to min assist. 9. OT will assess and treat for/with: ADL's, functional mobility, safety, upper extremity strength, adaptive techniques and equipment, NMR, pain mgt, family ed.   Goals are: supervision to min assist. Therapy may proceed with showering this patient. 10. SLP will assess and treat for/with: cognition, communication, swallowing.  Goals are: min assist. 11. Case Management and Social Worker will assess and treat for psychological issues and discharge planning. 12. Team conference will be held weekly to assess progress toward goals and to determine barriers to discharge. 13. Patient will receive at least 3 hours of therapy per day at least 5 days per week. 14. ELOS: 18-24 days days       15. Prognosis:  excellent     I have personally performed a face to face diagnostic evaluation of this patient and formulated the key components of the plan.  Additionally, I have personally reviewed laboratory data, imaging studies, as well as relevant notes and concur with the physician assistant's documentation above.   Ranelle Oyster, MD, [place] [facility]     Jacquelynn Cree, PA-C 12/13/2017  The patient's status has not changed. The original post admission physician evaluation remains appropriate, and any changes from the  pre-admission screening or documentation from the acute chart are noted above.  Ranelle Oyster, MD 12/13/2017

## 2017-12-14 ENCOUNTER — Inpatient Hospital Stay (HOSPITAL_COMMUNITY): Payer: Self-pay | Admitting: Physical Therapy

## 2017-12-14 ENCOUNTER — Inpatient Hospital Stay (HOSPITAL_COMMUNITY): Payer: Self-pay

## 2017-12-14 ENCOUNTER — Inpatient Hospital Stay (HOSPITAL_COMMUNITY): Payer: Worker's Compensation

## 2017-12-14 ENCOUNTER — Inpatient Hospital Stay (HOSPITAL_COMMUNITY): Payer: Self-pay | Admitting: Speech Pathology

## 2017-12-14 DIAGNOSIS — R52 Pain, unspecified: Secondary | ICD-10-CM

## 2017-12-14 LAB — CBC WITH DIFFERENTIAL/PLATELET
ABS IMMATURE GRANULOCYTES: 0.24 10*3/uL — AB (ref 0.00–0.07)
Basophils Absolute: 0 10*3/uL (ref 0.0–0.1)
Basophils Relative: 0 %
EOS ABS: 0.4 10*3/uL (ref 0.0–0.5)
Eosinophils Relative: 4 %
HEMATOCRIT: 32.1 % — AB (ref 39.0–52.0)
HEMOGLOBIN: 9.7 g/dL — AB (ref 13.0–17.0)
IMMATURE GRANULOCYTES: 2 %
LYMPHS ABS: 2.4 10*3/uL (ref 0.7–4.0)
LYMPHS PCT: 19 %
MCH: 27.6 pg (ref 26.0–34.0)
MCHC: 30.2 g/dL (ref 30.0–36.0)
MCV: 91.2 fL (ref 80.0–100.0)
MONOS PCT: 8 %
Monocytes Absolute: 1 10*3/uL (ref 0.1–1.0)
NEUTROS ABS: 8.4 10*3/uL — AB (ref 1.7–7.7)
NEUTROS PCT: 67 %
Platelets: 709 10*3/uL — ABNORMAL HIGH (ref 150–400)
RBC: 3.52 MIL/uL — ABNORMAL LOW (ref 4.22–5.81)
RDW: 14.4 % (ref 11.5–15.5)
WBC: 12.5 10*3/uL — ABNORMAL HIGH (ref 4.0–10.5)
nRBC: 0 % (ref 0.0–0.2)

## 2017-12-14 LAB — COMPREHENSIVE METABOLIC PANEL
ALBUMIN: 2.5 g/dL — AB (ref 3.5–5.0)
ALK PHOS: 167 U/L — AB (ref 38–126)
ALT: 35 U/L (ref 0–44)
AST: 23 U/L (ref 15–41)
Anion gap: 9 (ref 5–15)
BILIRUBIN TOTAL: 0.6 mg/dL (ref 0.3–1.2)
BUN: 21 mg/dL — AB (ref 6–20)
CO2: 25 mmol/L (ref 22–32)
CREATININE: 0.76 mg/dL (ref 0.61–1.24)
Calcium: 8.3 mg/dL — ABNORMAL LOW (ref 8.9–10.3)
Chloride: 101 mmol/L (ref 98–111)
GFR calc Af Amer: >60 mL/min (ref >60)
GLUCOSE: 126 mg/dL — AB (ref 70–99)
POTASSIUM: 4 mmol/L (ref 3.5–5.1)
Sodium: 135 mmol/L (ref 135–145)
TOTAL PROTEIN: 6.5 g/dL (ref 6.5–8.1)

## 2017-12-14 MED ORDER — LIDOCAINE 5 % EX PTCH
2.0000 | MEDICATED_PATCH | CUTANEOUS | Status: DC
  Administered 2017-12-14 – 2017-12-16 (×3): 2 via TRANSDERMAL
  Filled 2017-12-14 (×10): qty 2

## 2017-12-14 MED ORDER — PRO-STAT SUGAR FREE PO LIQD
30.0000 mL | Freq: Two times a day (BID) | ORAL | Status: DC
  Administered 2017-12-14 – 2017-12-19 (×11): 30 mL
  Filled 2017-12-14 (×11): qty 30

## 2017-12-14 MED ORDER — BENZOCAINE 10 % MT GEL
Freq: Four times a day (QID) | OROMUCOSAL | Status: DC | PRN
  Filled 2017-12-14 (×2): qty 9

## 2017-12-14 NOTE — Evaluation (Addendum)
Speech Language Pathology Assessment and Plan  Patient Details  Name: Willie Blevins MRN: [id number] Date of Birth: [birth year]  SLP Diagnosis: Dysarthria;Dysphagia;Cognitive Impairments  Rehab Potential: Excellent ELOS: 12-14 days     Today's Date: 12/14/2017 SLP Individual Time: 1400-1455 SLP Individual Time Calculation (min): 55 min   Problem List:  Patient Active Problem List   Diagnosis Date Noted  . Skull fracture with cerebral contusion ([place]) 12/13/2017  . Aspiration pneumonia of right lower lobe due to vomit ([place])   . Tracheostomy status ([place])   . PTSD (post-traumatic stress disorder)   . Acute stress disorder   . Closed extensive facial fractures ([place])   . Fracture   . Liver injury, laceration   . Trauma   . Traumatic brain injury with loss of consciousness ([place])   . Pulmonary emphysema ([place])   . Hyperglycemia   . Post-op pain   . Hyponatremia   . Crush injury, leg, lower, bilateral  12/02/2017  . Open right fibular fracture 12/02/2017  . Closed left subtrochanteric femur fracture ([place]) 11/29/2017   Past Medical History:  Past Medical History:  Diagnosis Date  . Bullous emphysema ([place])   . COPD (chronic obstructive pulmonary disease) ([place])   . GERD (gastroesophageal reflux disease)   . Open right fibular fracture 12/02/2017  . Strabismus    with lazy eye (left)  . Traumatic iritis    Past Surgical History:  Past Surgical History:  Procedure Laterality Date  . APPLICATION OF WOUND VAC Right 11/29/2017   Procedure: APPLICATION OF WOUND VAC;  Surgeon: Altamese Brackenridge, MD;  Location: [facility];  Service: Orthopedics;  Laterality: Right;  . FACIAL LACERATION REPAIR Left 11/29/2017   Procedure: ear LACERATION REPAIR;  Surgeon: Georganna Skeans, MD;  Location: [facility];  Service: General;  Laterality: Left;  . I&D EXTREMITY Right 11/29/2017   Procedure: IRRIGATION AND DEBRIDEMENT EXTREMITY;  Surgeon: Altamese Whitakers, MD;  Location: [facility];  Service: Orthopedics;   Laterality: Right;  . I&D EXTREMITY Right 12/01/2017   Procedure: IRRIGATION AND DEBRIDEMENT RIGHT LOWER EXTREMITY AND VAC CHANGE;  Surgeon: Altamese Tulia, MD;  Location: [facility];  Service: Orthopedics;  Laterality: Right;  . INTRAMEDULLARY (IM) NAIL INTERTROCHANTERIC Left 11/29/2017   Procedure: INTRAMEDULLARY (IM) NAIL INTERTROCHANTRIC;  Surgeon: Altamese , MD;  Location: [facility];  Service: Orthopedics;  Laterality: Left;  . ORIF MANDIBULAR FRACTURE N/A 12/01/2017   Procedure: OPEN REDUCTION INTERNAL FIXATION (ORIF) MANDIBULAR FRACTURE;  Surgeon: Melissa Montane, MD;  Location: [facility];  Service: ENT;  Laterality: N/A;  . TRACHEOSTOMY TUBE PLACEMENT N/A 12/01/2017   Procedure: TRACHEOSTOMY;  Surgeon: Melissa Montane, MD;  Location: [facility];  Service: ENT;  Laterality: N/A;  . TYMPANOMASTOIDECTOMY Right 12/05/2017   Procedure: right canal up tympanomastoid, right mastoidectomy, exploration right facial nerve, Type 1 tympanoplasty, CSF leak repair.;  Surgeon: Vicie Mutters, MD;  Location: [facility];  Service: ENT;  Laterality: Right;    Assessment / Plan / Recommendation Clinical Impression Patient is a 42 year old male with history of emphysema who was admitted on 11/29/2017 after being struck by a tree at work.  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  CT  head was unremarkable for acute intracranial process.  He was intubated in the ED and taken to the OR for repair of lacerations as well as IM nailing left femoral shaft and IND open fibula fracture with placement of wound VAC by Dr. Marcelino Scot.  Dr. Barry Dienes  consulted for input and felt that patient would require MMT as well as tracheostomy. He was taken back to the OR on 10/17 for ORIF mandibular fracture with tracheostomy by Dr. Barry Dienes and dressing change with I&D and excision of muscle and fat RLE.  He is to be PWB LLE and WBAT on RLE with Cam boot.  Aggressive range of motion BLE with recommendations of 30-day DVT prophylaxis with use of Lovenox.  Rhabdomyolysis due to crush injuries treated with IVF for hydration.  Postop with increase in oral secretions with fevers and treated with IV antibiotics for aspiration pneumonia.  He was taken back to the OR on 10/21 for right canal tympanomastoidectomy with exploration and partial decompression of right facial nerve, removal of fracture/dislocated incus from middle fossa and repair of CSF leak by Dr. Thornell Mule.  He was slow to wean off the ventilator due to mucous plugging as well as need of propofol for agitation.  He was extubated to ATC on 10/22 and is tolerating ATC.  He was downsized to CFS #6 10/28 and is tolerating button plugging X 24 hours.  Psychiatry consulted for input due to issues with anxiety and flashbacks as well as nightmares.  Prazosin added for nightmares with recommendations to use Atarax as needed or addition of Lexapro if latter ineffective.  Postauricular sutures removed today and he was noted to have suture abscess therefore Rocephin recommended by Dr. Thornell Mule.  Plans to have mandibular wires cut on 10/31 per Dr. Janace Hoard.  Patient on full liquid diet however easily fatigues with intake therefore cortak replaced 10/29.  Patient with delayed processing with intermittent bouts of agitation, requires verbal tactile cues and able to follow one-step commands consistently, has significant deficits in mobility and ability to carry out ADLs. CIR recommended due to polytrauma with significant deficits and patient admitted 12/13/17.   Patient demonstrates a mild dysarthria due to left oral-motor weakness and  mandibular fixation but is ~90-100% intelligible at the sentence level. Patient's phonation appeared [facility] despite capped #6 cuffless trach. Patient appeared to tolerate capped trach without signs of distress and all vitals remained WFL. Patient administered the MoCA-Version 7.3  and scored 27/30 points with a score of 26 or above considered normal. Mild high-level cognitive deficits noted in divided attention and short-term recall with low frustration tolerance noted intermittently throughout evaluation. Patient consumed Dys. 1 textures via a 60 cc syringe (small amounts) without overt s/s of aspiration but appeared to demonstrate prolonged AP transit with a suspected delay in swallow initiation and moderate right anterior spillage. Patient unable to receive a sufficient amount of thin liquids via straw due to poor labial seal, therefore, a 10cc syringe was used. Patient consumed liquids (~3 ml at a time) with what appeared to be a delayed swallow initiation and delayed coughing. However, both the patient and his wife report coughing throughout the day, suspect due to thick secretions that are difficult to clear. Delayed coughing was also noted with nectar-thick liquids. Due to patient appearing to be tolerating a full liquid diet at this time (afebrile, clear lung sounds), recommend patient continue his current diet. However, an objective swallow assessment may be warranted if overt s/s of aspiration persist. Hopeful swallowing function will continue  to improve once mandibular wires are removed and patient decannulated. Educated patient and wife in regards to recommendations, both verbalized understanding.  Patient would benefit from skilled SLP intervention to maximize his swallowing and cognitive functioning and speech intelligibility prior to discharge.    Skilled Therapeutic Interventions          Administered a cognitive-linguistic evaluation and BSE. Please see above for details.   SLP Assessment  Patient  will need skilled Speech Lanaguage Pathology Services during CIR admission    Recommendations  SLP Diet Recommendations: Thin(full liquids ) Liquid Administration via: Straw;Syringe Medication Administration: Via alternative means Supervision: Staff to assist with self feeding Compensations: Slow rate;Small sips/bites;Minimize environmental distractions Postural Changes and/or Swallow Maneuvers: Seated upright 90 degrees Oral Care Recommendations: Oral care BID Recommendations for Other Services: Neuropsych consult Patient destination: Home Follow up Recommendations: (TBD) Equipment Recommended: None recommended by SLP    SLP Frequency 3 to 5 out of 7 days   SLP Duration  SLP Intensity  SLP Treatment/Interventions 12-14 days   Minumum of 1-2 x/day, 30 to 90 minutes  Cognitive remediation/compensation;Environmental controls;Internal/external aids;Speech/Language facilitation;Therapeutic Activities;Patient/family education;Functional tasks;Dysphagia/aspiration precaution training;Cueing hierarchy    Pain Pain in ribs when coughing.    Short Term Goals: Week 1: SLP Short Term Goal 1 (Week 1): Patient will consume thin liquids with minimal overt s/s of aspiration and Mod I for use of swallowing compensatory strategies.  SLP Short Term Goal 2 (Week 1): Patient will utilize speech intelligibility strategies at the sentence level with Mod I to achieve 100% intelligibility.  SLP Short Term Goal 3 (Week 1): Patient will recall new, daily information with Mod I.  SLP Short Term Goal 4 (Week 1): [place] will dived attention between 2 tasks in a moderatly distracting enviornment for 30 minutes with Mod I.   Refer to Care Plan for Long Term Goals  Recommendations for other services: Neuropsych  Discharge Criteria: Patient will be discharged from SLP if patient refuses treatment 3 consecutive times without medical reason, if treatment goals not met, if there is a change in medical status,  if patient makes no progress towards goals or if patient is discharged from hospital.  The above assessment, treatment plan, treatment alternatives and goals were discussed and mutually agreed upon: by patient and by family  Trayquan Kolakowski 12/14/2017, 4:08 PM

## 2017-12-14 NOTE — Progress Notes (Signed)
[facility] PHYSICAL MEDICINE & REHABILITATION PROGRESS NOTE   Subjective/Complaints: Pt had a lot of left chest wall/back pain last night. Made it difficult to sleep. RN states he also had a dry cough which didn't help. Tolerating trach cap  ROS: Patient denies fever, rash, sore throat, blurred vision, nausea, vomiting, diarrhea,  shortness of breath   headache, or mood change.    Objective:   No results found. Recent Labs    12/13/17 0157 12/14/17 0604  WBC 14.3* 12.5*  HGB 10.1* 9.7*  HCT 32.5* 32.1*  PLT 696* 709*   Recent Labs    12/14/17 0604  NA 135  K 4.0  CL 101  CO2 25  GLUCOSE 126*  BUN 21*  CREATININE 0.76  CALCIUM 8.3*    Intake/Output Summary (Last 24 hours) at 12/14/2017 0841 Last data filed at 12/14/2017 0300 Gross per 24 hour  Intake 440 ml  Output 600 ml  Net -160 ml     Physical Exam: Vital Signs Blood pressure 125/77, pulse (!) 136, temperature 98 F (36.7 C), temperature source Oral, resp. rate 18, height 6\' 2"  (1.88 m), weight 98 kg, SpO2 94 %. Constitutional: No distress . Vital signs reviewed. HEENT: EOMI, jaws wired. Mucosa appears moist Neck: supple, #6 trach capped Cardiovascular: RRR without murmur. No JVD    Respiratory: CTA Bilaterally without wheezes or rales. Normal effort    GI: BS +, non-tender, non-distended   Musculoskeletal: 1+ edema left thigh down to 2+ left foot. Multiple sutures LLE with scabbed abrasions on left lateral shin. Right calf lac with sutures, scab--dry. 1+ Edema RLE. Left chest wall/back tender.  Neurological: He isalertand oriented to person, place, and time. Right facial weakness with slurred speech due to wires. Able to follow basic commands without difficulty. Right peripheral 7 with inability to close right eye lids and right facial droop. Fair awareness and insight.. Moves UE least 4/5. Lifts RLE easily against gravity. wiggles toes of both feet. Unable to lift left leg (pain). Normal  sensation Skin: Skin iswarmand dry. He isnot diaphoretic.  Psychiatric: cooperative, perhaps a little anxious.    Assessment/Plan: 1. 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  Physiatrist is providing close team supervision and 24 hour management of active medical problems listed below.  Physiatrist and rehab team continue to assess barriers to discharge/monitor patient progress toward functional and medical goals  Care Tool:  Bathing              Bathing assist Assist Level: Set up assist     Upper Body Dressing/Undressing Upper body dressing   What is the patient wearing?: Pull over shirt    Upper body assist Assist Level: Moderate Assistance - Patient 50 - 74%    Lower Body Dressing/Undressing Lower body dressing      What is the patient wearing?: Underwear/pull up     Lower body assist Assist for lower body dressing: Moderate Assistance - Patient 50 - 74%     Toileting Toileting    Toileting assist Assist for toileting: Moderate Assistance - Patient 50 - 74%     Transfers Chair/bed transfer  Transfers assist  Chair/bed transfer activity did not occur: Safety/medical concerns        Locomotion Ambulation   Ambulation assist              Walk 10 feet activity   Assist  Walk 50 feet activity   Assist           Walk 150 feet activity   Assist           Walk 10 feet on uneven surface  activity   Assist           Wheelchair     Assist               Wheelchair 50 feet with 2 turns activity    Assist            Wheelchair 150 feet activity     Assist        Medical Problem List and Plan: 1.Functional deficitssecondary to TBI/facial trauma with 7th nerve palsy/polytrauma -beginning therapies today 2. DVT Prophylaxis/Anticoagulation:  Pharmaceutical:Lovenox 3. Pain Management:Continue scheduled tylenol, gabapentin and robaxin. Oxycodone prn for severe pain.  -add kpad, lidoderm patches for chest wall discomfort 4. Mood:LCSW to follow for evaluation and support. 5. Neuropsych: This patientis not fullycapable of making decisions on hisown behalf. 6. Skin/Wound Care:Monitor wounds for healing. Continue nutritional supplements to promote healing. 7. Fluids/Electrolytes/Nutrition:Monitor I/O. Calorie ct/ TF  -I personally reviewed the patient's labs today.    -protein deficient----increase protein intake 8.Acute hypoxic respiratory failure:Tolerating T collar.Downsized to CFS#6 on 10/28 -trach is capped  -consider decanulation from 6 vs downsize to 4 once manibular wires come out, ENT also following 9. TBIwith skull fracture and right facial nerve paralysis: S/Pright canal tympanomastoidectomy with decompression of facial nerve and repair of CSF leak.Will need audiology evaluation after discharge 10.Right postauricular suture abscess: Continue Rocephin IV 2 g daily per ENT input 11.Bilateral LLE crush injuries with left septic femur fracture and right open fibula fracture with extensive muscle injury: PWB on RLE and WBAT LLE.  12.Right facial and mandible fracture status post ORIF mandible 10/17:  13.Acute stress disorder with anxiety/PTSD:   -Started on prazosin for nightmares. bp holding  -Psychiatry recommends adding Lexapro if prazosin or Atarax ineffective   14. Aspiration PNA: Leucocytosis resolving. Continue Rocephin added 10/20 for 22 day course.   -neuropsych to see here  -wbc's falling steadily 15. ABLA: Continue to monitor for recovery. 9.7 on 10/30 16. Rhabdomyolysis: CK improving 1815-->4575--> 1,963.added water flushes. Encourage fuid intake.  17. Abnormal LFTs: returning to near normal range (10/30)       LOS: 1 days A FACE TO FACE EVALUATION WAS  PERFORMED  Ranelle Oyster 12/14/2017, 8:41 AM

## 2017-12-14 NOTE — Evaluation (Signed)
Occupational Therapy Assessment and Plan  Patient Details  Name: Willie Blevins MRN: [id number] Date of Birth: [birth year]  OT Diagnosis: acute pain and muscle weakness (generalized) Rehab Potential: Rehab Potential (ACUTE ONLY): Good ELOS: 14-18 days   Today's Date: 12/14/2017 OT Individual Time: 3785-8850 OT Individual Time Calculation (min): 60 min     Problem List:  Patient Active Problem List   Diagnosis Date Noted  . Skull fracture with cerebral contusion ([place]) 12/13/2017  . Aspiration pneumonia of right lower lobe due to vomit ([place])   . Tracheostomy status ([place])   . PTSD (post-traumatic stress disorder)   . Acute stress disorder   . Closed extensive facial fractures ([place])   . Fracture   . Liver injury, laceration   . Trauma   . Traumatic brain injury with loss of consciousness ([place])   . Pulmonary emphysema ([place])   . Hyperglycemia   . Post-op pain   . Hyponatremia   . Crush injury, leg, lower, bilateral  12/02/2017  . Open right fibular fracture 12/02/2017  . Closed left subtrochanteric femur fracture ([place]) 11/29/2017    Past Medical History:  Past Medical History:  Diagnosis Date  . Bullous emphysema ([place])   . COPD (chronic obstructive pulmonary disease) ([place])   . GERD (gastroesophageal reflux disease)   . Open right fibular fracture 12/02/2017  . Strabismus    with lazy eye (left)  . Traumatic iritis    Past Surgical History:  Past Surgical History:  Procedure Laterality Date  . APPLICATION OF WOUND VAC Right 11/29/2017   Procedure: APPLICATION OF WOUND VAC;  Surgeon: Altamese Huntsville, MD;  Location: [facility];  Service: Orthopedics;  Laterality: Right;  . FACIAL LACERATION REPAIR Left 11/29/2017   Procedure: ear LACERATION REPAIR;  Surgeon: Georganna Skeans, MD;  Location: [facility];  Service: General;  Laterality: Left;  . I&D EXTREMITY Right 11/29/2017   Procedure: IRRIGATION AND DEBRIDEMENT EXTREMITY;  Surgeon: Altamese [place], MD;  Location: [facility];  Service:  Orthopedics;  Laterality: Right;  . I&D EXTREMITY Right 12/01/2017   Procedure: IRRIGATION AND DEBRIDEMENT RIGHT LOWER EXTREMITY AND VAC CHANGE;  Surgeon: Altamese [place], MD;  Location: [facility];  Service: Orthopedics;  Laterality: Right;  . INTRAMEDULLARY (IM) NAIL INTERTROCHANTERIC Left 11/29/2017   Procedure: INTRAMEDULLARY (IM) NAIL INTERTROCHANTRIC;  Surgeon: Altamese Liscomb, MD;  Location: [facility];  Service: Orthopedics;  Laterality: Left;  . ORIF MANDIBULAR FRACTURE N/A 12/01/2017   Procedure: OPEN REDUCTION INTERNAL FIXATION (ORIF) MANDIBULAR FRACTURE;  Surgeon: Melissa Montane, MD;  Location: [facility];  Service: ENT;  Laterality: N/A;  . TRACHEOSTOMY TUBE PLACEMENT N/A 12/01/2017   Procedure: TRACHEOSTOMY;  Surgeon: Melissa Montane, MD;  Location: [facility];  Service: ENT;  Laterality: N/A;  . TYMPANOMASTOIDECTOMY Right 12/05/2017   Procedure: right canal up tympanomastoid, right mastoidectomy, exploration right facial nerve, Type 1 tympanoplasty, CSF leak repair.;  Surgeon: Vicie Mutters, MD;  Location: [facility];  Service: ENT;  Laterality: Right;    Assessment & Plan Clinical Impression: Willie Blevins is a 42 year old male withhistory of emphysema who was admitted on 11/29/2017 after being struck by a tree at work. Patient hypotensive at admission and work-up revealed TBI with skull base fracture displaced at right foramen rotundum and right temporal bone fracture with malleoincudalsubluxation,right facial nerve injury,right maxillary sinus lateral orbital wall right mastoid process and nasal septum fractures, right scalp and ear laceration, splenic and liver lacerations, crush injury to BLE with open right fibula fracture and left subtrochanteric femur fracture.  CT head was unremarkable for acute intracranial process. He was intubated in the ED and taken to the OR for repair of lacerations as well as IM nailing left femoral shaft and IND open fibula fracture with placement of wound VAC by Dr. Marcelino Scot. Dr.  Nigel Sloop for input and felt that patient would require MMT as well as tracheostomy.  He was taken back to the OR on 10/17 for ORIF mandibular fracture with tracheostomy by Dr. Barry Dienes and dressing change with I&D and excision of muscle and fat RLE.He is to be PWB LLE and WBAT on RLE with Cam boot. Aggressive range of motion BLE with recommendations of30-day DVT prophylaxis with use of Lovenox.Rhabdomyolysis due to crush injuries treated with IVF for hydration. Postop with increase in oral secretions with fevers and treated with IV antibiotics for aspiration pneumonia. He was taken back to the OR on 10/21 for right canal tympanomastoidectomy with exploration and partial decompression of right facial nerve, removal of fracture/dislocated incus from middle fossa and repair of CSF leak by Dr. Thornell Mule. He was slow to wean off the ventilator due to mucous plugging as well as need of propofol for agitation.   He was extubated to ATC on 10/22 and is tolerating ATC. He was downsized to CFS #6 yesterday and is tolerating button plugging X 24 hours.Psychiatry consulted for input due to issues with anxiety and flashbacks as well as nightmares. Prazosin added for nightmares with recommendations to use Atarax as needed or addition of Lexapro if latterineffective.Postauricular sutures removed today and he was noted to have suture abscess therefore Rocephin recommended by Dr. Thornell Mule. Plans to have mandibular mandibular wires cut on Thursday per Dr. Janace Hoard.Patient on full liquid diet however easy fatigability with intake therefore cortakreplaced today.Patient with delayed processing with intermittent bouts of agitation, requires verbal tactile cues and able to follow one-step commands consistently, has significant deficits in mobility and ability to carry out ADLs.CIR recommended due to polytrauma with significant deficits Patient transferred to CIR on 12/13/2017 .    Patient currently requires mod  with basic self-care skills secondary to muscle weakness, decreased cardiorespiratoy endurance, decreased visual acuity, decreased visual perceptual skills and decreased visual motor skills and decreased sitting balance, decreased standing balance, decreased postural control and decreased balance strategies.  Prior to hospitalization, patient could complete ADLs with independent .  Patient will benefit from skilled intervention to decrease level of assist with basic self-care skills and increase level of independence with iADL prior to discharge home with care partner.  Anticipate patient will require intermittent supervision and follow up home health.  OT - End of Session Activity Tolerance: Tolerates < 10 min activity with changes in vital signs Endurance Deficit: Yes Endurance Deficit Description: fatigues quickly with mobility activities OT Assessment Rehab Potential (ACUTE ONLY): Good OT Patient demonstrates impairments in the following area(s): Balance;Cognition;Safety;Edema;Endurance;Sensory;Motor;Skin Integrity;Pain;Vision;Behavior OT Basic ADL's Functional Problem(s): Eating;Grooming;Bathing;Dressing;Toileting OT Transfers Functional Problem(s): Toilet;Tub/Shower OT Additional Impairment(s): None OT Plan OT Intensity: Minimum of 1-2 x/day, 45 to 90 minutes OT Frequency: 5 out of 7 days OT Duration/Estimated Length of Stay: 14-18 days OT Treatment/Interventions: Balance/vestibular training;Cognitive remediation/compensation;Community reintegration;Disease mangement/prevention;Self Care/advanced ADL retraining;Patient/family education;Therapeutic Exercise;Wheelchair propulsion/positioning;UE/LE Coordination activities;Discharge planning;DME/adaptive equipment instruction;Functional mobility training;Pain management;Psychosocial support;Skin care/wound managment;UE/LE Strength taining/ROM;Therapeutic Activities;Visual/perceptual remediation/compensation OT Self Feeding Anticipated  Outcome(s): mod I OT Basic Self-Care Anticipated Outcome(s): (S) OT Toileting Anticipated Outcome(s): (S) OT Bathroom Transfers Anticipated Outcome(s): (S) OT Recommendation Recommendations for Other Services: Speech consult;Neuropsych consult Patient destination: Home Follow Up Recommendations: Home health OT Equipment  Recommended: To be determined   Skilled Therapeutic Intervention Pt seen for skilled OT evaluation. Edu provided re OT POC, ELOS, rehab expectations, and condition insight. Pt completed bed mobility with min A. Pt stood with mod A from EOB using RW. Cueing provided for WB precautions. Min cueing provided during dressing for sequencing. Pt completed lateral scooting to recliner with min A. Pt's O2 levels were >95% throughout session, pt's HR remained tachycardic throughout session, staying between 130-155bpm. RN aware. Pt left sitting up in recliner with wife present.   OT Evaluation Precautions/Restrictions  Precautions Precautions: Fall Precaution Comments: unlimited ROM BLE; ROM encouraged Restrictions Weight Bearing Restrictions: Yes RLE Weight Bearing: Weight bearing as tolerated LLE Weight Bearing: Partial weight bearing General Chart Reviewed: Yes Family/Caregiver Present: Yes Vital Signs Therapy Vitals Pulse Rate: (!) 112 Resp: 20 Patient Position (if appropriate): Sitting Oxygen Therapy SpO2: 95 % O2 Device: Room Air Patient Activity (if Appropriate): In chair Pulse Oximetry Type: Continuous Pain Pain Assessment Pain Scale: 0-10 Pain Score: 8  Pain Type: Surgical pain Pain Location: Leg Pain Orientation: Left Pain Descriptors / Indicators: Aching Pain Onset: On-going Pain Intervention(s): Emotional support;Elevated extremity Home Living/Prior Functioning Home Living Available Help at Discharge: Family Type of Home: House Home Access: Stairs to enter Technical brewer of Steps: 3 Entrance Stairs-Rails: None Home Layout: One  level Bathroom Shower/Tub: Optometrist: No  Lives With: Spouse, Other (Comment)(3 children, 1 26 yo, 2 under 22 yo) IADL History Homemaking Responsibilities: No Current License: Yes Mode of Transportation: Car Occupation: Full time employment Type of Occupation: Acupuncturist Prior Function Level of Independence: Independent with basic ADLs, Independent with transfers, Independent with gait  Able to Take Stairs?: Yes Driving: Yes Vocation: Full time employment Comments: works as Acupuncturist; 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 ADL ADL Grooming: Minimal assistance Where Assessed-Grooming: Edge of bed Upper Body Bathing: Minimal assistance Where Assessed-Upper Body Bathing: Edge of bed Lower Body Bathing: Moderate assistance Where Assessed-Lower Body Bathing: Edge of bed Upper Body Dressing: Minimal assistance Where Assessed-Upper Body Dressing: Edge of bed Lower Body Dressing: Maximal assistance Where Assessed-Lower Body Dressing: Edge of bed Toileting: Not assessed Vision Vision Assessment?: Vision impaired- to be further tested in functional context;Yes Eye Alignment: Impaired (comment)(L uncovered eye hx of strabismus) Ocular Range of Motion: Within Functional Limits Alignment/Gaze Preference: Within Defined Limits Tracking/Visual Pursuits: Decreased smoothness of horizontal tracking;Decreased smoothness of vertical tracking Saccades: Impaired - to be further tested in functional context Convergence: Impaired - to be further tested in functional context Visual Fields: No apparent deficits Perception  Perception: Within Functional Limits Praxis Praxis: Intact Cognition Overall Cognitive Status: Within Functional Limits for tasks assessed Arousal/Alertness: Awake/alert Orientation Level: Person;Place;Situation Person: Oriented Place: Oriented Situation: Oriented Year: 2019 Month: October Day of Week: Correct Memory: Appears  intact Immediate Memory Recall: Sock;Blue;Bed Memory Recall: Bed;Sock;Blue Memory Recall Sock: Without Cue Memory Recall Blue: Without Cue Memory Recall Bed: Without Cue Attention: Alternating Alternating Attention Impairment: Verbal complex;Functional complex Awareness: Appears intact Problem Solving: Appears intact Reasoning: Appears intact Behaviors: Poor frustration tolerance Safety/Judgment: Appears intact Sensation Sensation Light Touch: Impaired Detail Central sensation comments: Intact by assessment but pt reports pins and needles in B LE Coordination Gross Motor Movements are Fluid and Coordinated: No Fine Motor Movements are Fluid and Coordinated: Yes Coordination and Movement Description: generalized weakness Finger Nose Finger Test: [facility] Motor  Motor Motor: Other (comment) Motor - Skilled Clinical Observations: generalized deconditioning Mobility  Bed Mobility Bed Mobility: Supine to Sit;Sit  to Supine Supine to Sit: Minimal Assistance - Patient > 75% Sit to Supine: Minimal Assistance - Patient > 75% Transfers Sit to Stand: Moderate Assistance - Patient 50-74% Stand to Sit: Minimal Assistance - Patient > 75%  Trunk/Postural Assessment  Cervical Assessment Cervical Assessment: Exceptions to WFL(forward head with rounded shoulders) Thoracic Assessment Thoracic Assessment: Within Functional Limits Lumbar Assessment Lumbar Assessment: Exceptions to WFL(sitting in posterior pelvic tilt) Postural Control Postural Control: Deficits on evaluation Righting Reactions: delayed  Balance Balance Balance Assessed: Yes Static Sitting Balance Static Sitting - Balance Support: Feet supported;Bilateral upper extremity supported Static Sitting - Level of Assistance: 5: Stand by assistance Dynamic Sitting Balance Dynamic Sitting - Balance Support: Feet supported;During functional activity Dynamic Sitting - Level of Assistance: 5: Stand by assistance Dynamic Sitting Balance  - Compensations: posterior pelvic tilt Static Standing Balance Static Standing - Balance Support: During functional activity;Bilateral upper extremity supported Static Standing - Level of Assistance: 4: Min assist Extremity/Trunk Assessment RUE Assessment RUE Assessment: Exceptions to [facility] General Strength Comments: generalized weakness LUE Assessment LUE Assessment: Exceptions to [facility] General Strength Comments: generalized weakness     Refer to Care Plan for Long Term Goals  Recommendations for other services: Neuropsych   Discharge Criteria: Patient will be discharged from OT if patient refuses treatment 3 consecutive times without medical reason, if treatment goals not met, if there is a change in medical status, if patient makes no progress towards goals or if patient is discharged from hospital.  The above assessment, treatment plan, treatment alternatives and goals were discussed and mutually agreed upon: by patient and by family  Curtis Sites 12/14/2017, 12:27 PM

## 2017-12-14 NOTE — Progress Notes (Signed)
Calorie Count Note  48-hour calorie count ordered and started yesterday with dinner meal. Calorie count to end tomorrow, 12/15/17, after dinner meal.  Spoke with RN who reports that she gave pt a chocolate Ensure Enlive this AM but that he has not consumed any due to not having a syringe (which makes consumption easier for pt). RN to provide syringe.  Diet: Full Liquid PO supplements: - Ensure Enlive TID, each supplement provides 350 kcal and 20 grams of protein  12/13/17: Dinner (1800): 1 Boost Breeze oral nutrition supplement, 60 cc creamed potatoes (355 kcal, 10 grams of protein)  Supplements: 1 Ensure Enlive oral nutrition supplement per [facility] documentation; however, unsure if pt consumed 100% (350 kcal, 20 grams of protein)  12/14/17: Breakfast: 120 cc Gatorade (30 kcal, 0 grams of protein)  Supplements: none yet today  Total PO Intake: 735 kcal (29% of minimum estimated needs)  30 grams protein (22% of minimum estimated needs)  Nutrition Diagnosis: Increased nutrient needsrelated to post-op healing, other (therapies)as evidenced by estimated needs.  Goal: Patient will meet greater than or equal to 90% of their needs  Intervention: - Ensure Enlive po TID, each supplement provides 350 kcal and 20 grams of protein - Nocturnal tube feeding via Cortrak: Pivot 1.5@ 11ml/hr over 14 hours (1800 to 0800) to provide 1785 kcal, 112 grams of protein, and 904 ml free water - Per MD, Pro-stat 30 ml BID via Cortrak which provides an additional 200 kcal and 30 grams of protein - 48-hour calorie count   Earma Reading, MS, RD, LDN Inpatient Clinical Dietitian Pager: [phone] Weekend/After Hours: [phone]

## 2017-12-14 NOTE — Progress Notes (Signed)
Patient information reviewed and entered into e[facility]hab system by Makinzi Prieur, RN, CRRN, PPS Coordinator.  Information including medical coding, functional ability and quality indicators will be reviewed and updated through discharge.    

## 2017-12-14 NOTE — Progress Notes (Signed)
Physical Therapy Session Note  Patient Details  Name: Willie Blevins MRN: [id number] Date of Birth: May 30, 1975  Today's Date: 12/14/2017 PT Individual Time: 1300-1400 PT Individual Time Calculation (min): 60 min   Short Term Goals: Week 1:  PT Short Term Goal 1 (Week 1): Pt will perform bed mobility with S PT Short Term Goal 2 (Week 1): Pt will perform transfers with consistent min guard PT Short Term Goal 3 (Week 1): Pt will perform sit <>stand consistent minA PT Short Term Goal 4 (Week 1): Pt will initiate gait training  Skilled Therapeutic Interventions/Progress Updates:   Pt resting in bed, stating that he felt awful.  Pt's pain 9/10 L ribs; PT consulted with [name], RN and requested order for abdominal binder.  [name], PA stated that pt is cleared by his Ortho for WBAT bil LEs.    In supine at > 30 degrees elevated head position: 10 x 1 each R straight leg raises, L active assistive straight leg raises, R/L short arc quad knee extension, R hip abduction, L active assistive hip abduction; bil hip internal rotation, bil glut sets, R shoulder protraction.  3 x 1 L shoulder protraction limited by rib pain.  R/L 2 x 10 ankle pumps.  PT educated pt and wife about relaxation imagery, deep breathing to tolerance, exhaling during painful movements.  Attempted partial R side lying with towel rolls under hip and thigh, but pt unable to bend L knee sufficiently to un weight L buttock, and was not comfortable.  Pt left resting in bed with HOB raised, alarm set and needs at hand.  Therapy Documentation Precautions:  Precautions Precautions: Fall Precaution Comments: unlimited ROM BLE; ROM encouraged Restrictions Weight Bearing Restrictions: Yes RLE Weight Bearing: Weight bearing as tolerated LLE Weight Bearing: Partial weight bearing General: Chart Reviewed: Yes Response to Previous Treatment: Patient reporting fatigue but able to participate. Family/Caregiver Present: Yes Vital  Signs: Oxygen Therapy SpO2: 98 % O2 Device: Room Air;Tracheostomy Collar Patient Activity (if Appropriate): In chair Pulse Oximetry Type: Continuous Pain: Pain Assessment Pain Scale: 0-10 Pain Score: 9  Pain Location: ribs Pain Orientation: Left Pain Descriptors / Indicators: Aching Pain Onset: On-going Pain Intervention(s): Emotional support;Elevated extremity    Therapy/Group: Individual Therapy  [name] 12/14/2017, 3:07 PM

## 2017-12-14 NOTE — Progress Notes (Signed)
Orthopedic Tech Progress Note Patient Details:  Willie Blevins [birth year] [id number]  Ortho Devices Type of Ortho Device: CAM walker Ortho Device/Splint Location: lle Ortho Device/Splint Interventions: Application   Post Interventions Patient Tolerated: Well Instructions Provided: Care of device   Dary Dilauro 12/14/2017, 12:00 PM

## 2017-12-14 NOTE — Progress Notes (Signed)
Bilateral lower extremity venous duplex has been completed. Negative for DVT.  12/14/17 3:57 PM Olen Cordial RVT

## 2017-12-14 NOTE — Progress Notes (Signed)
Orthopedic Tech Progress Note Patient Details:  Willie Blevins 10-22-1975 [id number]  Ortho Devices Type of Ortho Device: Abdominal binder Ortho Device/Splint Location: lle Ortho Device/Splint Interventions: Ordered   Post Interventions Patient Tolerated: Well Instructions Provided: Care of device   Nikki Dom 12/14/2017, 2:49 PM

## 2017-12-14 NOTE — Evaluation (Signed)
Physical Therapy Assessment and Plan  Patient Details  Name: Willie Blevins MRN: [id number] Date of Birth: April 22, 1975  PT Diagnosis: Abnormality of gait, Cognitive deficits, Difficulty walking, Edema, Muscle spasms and Muscle weakness Rehab Potential: Good ELOS: 12-14 days   Today's Date: 12/14/2017 PT Individual Time: 1000-1100 PT Individual Time Calculation (min): 60 min    Problem List:  Patient Active Problem List   Diagnosis Date Noted  . Skull fracture with cerebral contusion ([place]) 12/13/2017  . Aspiration pneumonia of right lower lobe due to vomit ([place])   . Tracheostomy status ([place])   . PTSD (post-traumatic stress disorder)   . Acute stress disorder   . Closed extensive facial fractures ([place])   . Fracture   . Liver injury, laceration   . Trauma   . Traumatic brain injury with loss of consciousness ([place])   . Pulmonary emphysema ([place])   . Hyperglycemia   . Post-op pain   . Hyponatremia   . Crush injury, leg, lower, bilateral  12/02/2017  . Open right fibular fracture 12/02/2017  . Closed left subtrochanteric femur fracture ([place]) 11/29/2017    Past Medical History:  Past Medical History:  Diagnosis Date  . Bullous emphysema ([place])   . COPD (chronic obstructive pulmonary disease) ([place])   . GERD (gastroesophageal reflux disease)   . Open right fibular fracture 12/02/2017  . Strabismus    with lazy eye (left)  . Traumatic iritis    Past Surgical History:  Past Surgical History:  Procedure Laterality Date  . APPLICATION OF WOUND VAC Right 11/29/2017   Procedure: APPLICATION OF WOUND VAC;  Surgeon: Altamese National, MD;  Location: [facility];  Service: Orthopedics;  Laterality: Right;  . FACIAL LACERATION REPAIR Left 11/29/2017   Procedure: ear LACERATION REPAIR;  Surgeon: Georganna Skeans, MD;  Location: [facility];  Service: General;  Laterality: Left;  . I&D EXTREMITY Right 11/29/2017   Procedure: IRRIGATION AND DEBRIDEMENT EXTREMITY;  Surgeon: Altamese [place], MD;   Location: [facility];  Service: Orthopedics;  Laterality: Right;  . I&D EXTREMITY Right 12/01/2017   Procedure: IRRIGATION AND DEBRIDEMENT RIGHT LOWER EXTREMITY AND VAC CHANGE;  Surgeon: Altamese Red Cross, MD;  Location: [facility];  Service: Orthopedics;  Laterality: Right;  . INTRAMEDULLARY (IM) NAIL INTERTROCHANTERIC Left 11/29/2017   Procedure: INTRAMEDULLARY (IM) NAIL INTERTROCHANTRIC;  Surgeon: Altamese [place], MD;  Location: [facility];  Service: Orthopedics;  Laterality: Left;  . ORIF MANDIBULAR FRACTURE N/A 12/01/2017   Procedure: OPEN REDUCTION INTERNAL FIXATION (ORIF) MANDIBULAR FRACTURE;  Surgeon: Melissa Montane, MD;  Location: [facility];  Service: ENT;  Laterality: N/A;  . TRACHEOSTOMY TUBE PLACEMENT N/A 12/01/2017   Procedure: TRACHEOSTOMY;  Surgeon: Melissa Montane, MD;  Location: [facility];  Service: ENT;  Laterality: N/A;  . TYMPANOMASTOIDECTOMY Right 12/05/2017   Procedure: right canal up tympanomastoid, right mastoidectomy, exploration right facial nerve, Type 1 tympanoplasty, CSF leak repair.;  Surgeon: Vicie Mutters, MD;  Location: [facility];  Service: ENT;  Laterality: Right;    Assessment & Plan Clinical Impression: Willie Blevins is a 42 year old male withhistory of emphysema who was admitted on 11/29/2017 after being struck by a tree at work. Patient hypotensive at admission and work-up revealed TBI with skull base fracture displaced at right foramen rotundum and right temporal bone fracture with malleoincudalsubluxation,right facial nerve injury,right maxillary sinus lateral orbital wall right mastoid process and nasal septum fractures, right scalp and ear laceration, splenic and liver lacerations, crush injury to BLE with open right fibula fracture and left subtrochanteric  femur fracture. CT head was unremarkable for acute intracranial process. He was intubated in the ED and taken to the OR for repair of lacerations as well as IM nailing left femoral shaft and IND open fibula fracture with placement of  wound VAC by Dr. Marcelino Scot. Dr. Nigel Sloop for input and felt that patient would require MMT as well as tracheostomy.  He was taken back to the OR on 10/17 for ORIF mandibular fracture with tracheostomy by Dr. Barry Dienes and dressing change with I&D and excision of muscle and fat RLE.He is to be PWB LLE and WBAT on RLE with Cam boot. Aggressive range of motion BLE with recommendations of30-day DVT prophylaxis with use of Lovenox.Rhabdomyolysis due to crush injuries treated with IVF for hydration. Postop with increase in oral secretions with fevers and treated with IV antibiotics for aspiration pneumonia. He was taken back to the OR on 10/21 for right canal tympanomastoidectomy with exploration and partial decompression of right facial nerve, removal of fracture/dislocated incus from middle fossa and repair of CSF leak by Dr. Thornell Mule. He was slow to wean off the ventilator due to mucous plugging as well as need of propofol for agitation.   He was extubated to ATC on 10/22 and is tolerating ATC. He was downsized to CFS #6 yesterday and is tolerating button plugging X 24 hours.Psychiatry consulted for input due to issues with anxiety and flashbacks as well as nightmares. Prazosin added for nightmares with recommendations to use Atarax as needed or addition of Lexapro if latterineffective.Postauricular sutures removed today and he was noted to have suture abscess therefore Rocephin recommended by Dr. Thornell Mule. Plans to have mandibular mandibular wires cut on Thursday per Dr. Janace Hoard.Patient on full liquid diet however easy fatigability with intake therefore cortakreplaced today.Patient with delayed processing with intermittent bouts of agitation, requires verbal tactile cues and able to follow one-step commands consistently, has significant deficits in mobility and ability to carry out ADLs.CIR recommended due to polytrauma with significant deficits Patient transferred to CIR on 12/13/2017 .    Patient currently requires mod with mobility secondary to muscle weakness, decreased cardiorespiratoy endurance, decreased awareness and decreased problem solving and decreased standing balance, decreased postural control, decreased balance strategies and difficulty maintaining precautions.  Prior to hospitalization, patient was independent  with mobility and lived with Spouse, Other (Comment)(3 children, 1 41 yo, 2 under 40 yo) in a House home.  Home access is 3Stairs to enter.  Patient will benefit from skilled PT intervention to maximize safe functional mobility, minimize fall risk and decrease caregiver burden for planned discharge home with 24 hour supervision.  Anticipate patient will benefit from follow up [place] at discharge.  PT - End of Session Activity Tolerance: Tolerates 10 - 20 min activity with multiple rests Endurance Deficit: Yes Endurance Deficit Description: fatigues quickly with mobility activities PT Assessment Rehab Potential (ACUTE/IP ONLY): Good PT Barriers to Discharge: Weight bearing restrictions PT Patient demonstrates impairments in the following area(s): Balance;Motor;Edema;Endurance;Pain;Safety;Skin Integrity PT Transfers Functional Problem(s): Bed Mobility;Bed to Chair;Car;Furniture PT Locomotion Functional Problem(s): Ambulation;Wheelchair Mobility PT Plan PT Intensity: Minimum of 1-2 x/day ,45 to 90 minutes PT Frequency: 5 out of 7 days PT Duration Estimated Length of Stay: 12-14 days PT Treatment/Interventions: Ambulation/gait training;Discharge planning;Psychosocial support;Functional mobility training;Therapeutic Activities;Wheelchair propulsion/positioning;Therapeutic Exercise;Skin care/wound management;Disease management/prevention;Neuromuscular re-education;Balance/vestibular training;Cognitive remediation/compensation;DME/adaptive equipment instruction;Pain management;Splinting/orthotics;UE/LE Strength taining/ROM;UE/LE Coordination activities;Patient/family  education;Functional electrical stimulation;Community reintegration PT Transfers Anticipated Outcome(s): S PT Locomotion Anticipated Outcome(s): S overall w/c level; controlled environment gait goal minA x10' PT Recommendation Recommendations for  Other Services: Neuropsych consult;Therapeutic Recreation consult Therapeutic Recreation Interventions: Outing/community reintergration Follow Up Recommendations: Home health PT Patient destination: Home Equipment Recommended: To be determined Equipment Details: has none; likely will need w/c, RW, ramp for home entry  Skilled Therapeutic Intervention Pt received in recliner, c/o pain as below and agreeable to treatment. Initial PT evaluation performed and completed as below with minA overall at w/c level. Eval limited by pain, fatigue, IV issues and orthostasis. BP in sitting following transfer to w/c from recliner 104/75 HR 119 O2 96%; alerted RN who discussed with PA and recommend abdominal binder d/t dopplers not completed and fracture/pain BLEs deferring use of TEDs for BP management. Educated pt and wife in LE supine exercises including AROM RLE straight leg raises, AAROM LLE short arc quads and straight leg raise, BLE glute sets; provided handout for continued performance. Educated pt and wife regarding ELOS, goals to be determined by team once all evaluations completed. Remained in bed at end of session, RN and wife present, all needs in reach.   PT Evaluation Precautions/Restrictions Precautions Precautions: Fall Precaution Comments: unlimited ROM BLE; ROM encouraged Restrictions Weight Bearing Restrictions: Yes RLE Weight Bearing: Weight bearing as tolerated LLE Weight Bearing: Partial weight bearing General Chart Reviewed: Yes Response to Previous Treatment: Patient reporting fatigue but able to participate. Family/Caregiver Present: Yes Vital SignsTherapy Vitals Pulse Rate: (!) 136 Resp: 18 Patient Position (if appropriate):  Sitting Oxygen Therapy SpO2: 98 % O2 Device: Room Air;Tracheostomy Collar Patient Activity (if Appropriate): In chair Pulse Oximetry Type: Continuous Pain Pain Assessment Pain Scale: 0-10 Pain Score: 8  Pain Type: Surgical pain Pain Location: Leg Pain Orientation: Left Pain Descriptors / Indicators: Aching Pain Onset: On-going Pain Intervention(s): Emotional support;Elevated extremity Home Living/Prior Functioning Home Living Available Help at Discharge: Family Type of Home: House Home Access: Stairs to enter Technical brewer of Steps: 3 Entrance Stairs-Rails: None Home Layout: One level Bathroom Shower/Tub: Chiropodist: Standard Bathroom Accessibility: No  Lives With: Spouse;Other (Comment)(3 children, 1 32 yo, 2 under 42 yo) Prior Function Level of Independence: Independent with basic ADLs;Independent with transfers;Independent with gait  Able to Take Stairs?: Yes Driving: Yes Vocation: Full time employment Comments: works as Acupuncturist; 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 Vision/Perception  Vision - Assessment Eye Alignment: Impaired (comment)(L uncovered eye hx of strabismus) Ocular Range of Motion: Within Functional Limits Alignment/Gaze Preference: Within Defined Limits Tracking/Visual Pursuits: Decreased smoothness of horizontal tracking;Decreased smoothness of vertical tracking Saccades: Impaired - to be further tested in functional context Convergence: Impaired - to be further tested in functional context Perception Perception: Within Functional Limits Praxis Praxis: Intact  Cognition Overall Cognitive Status: Within Functional Limits for tasks assessed Arousal/Alertness: Awake/alert Orientation Level: Oriented X4 Attention: Alternating Alternating Attention Impairment: Verbal complex;Functional complex Memory: Appears intact Awareness: Appears intact Problem Solving: Appears intact Reasoning: Appears intact Behaviors: Poor frustration  tolerance Safety/Judgment: Appears intact Sensation Sensation Light Touch: Impaired Detail Central sensation comments: Intact by assessment but pt reports pins and needles in B LE Coordination Gross Motor Movements are Fluid and Coordinated: No Fine Motor Movements are Fluid and Coordinated: Yes Coordination and Movement Description: generalized weakness Finger Nose Finger Test: [facility] Motor  Motor Motor: Other (comment) Motor - Skilled Clinical Observations: generalized deconditioning  Mobility Bed Mobility Bed Mobility: Supine to Sit;Sit to Supine Supine to Sit: Minimal Assistance - Patient > 75% Sit to Supine: Minimal Assistance - Patient > 75% Transfers Transfers: Sit to Stand;Stand to Sit;Lateral/Scoot Transfers Sit to Stand: Moderate Assistance -  Patient 50-74% Stand to Sit: Minimal Assistance - Patient > 75% Lateral/Scoot Transfers: Minimal Assistance - Patient > 75% Transfer (Assistive device): Rolling walker Locomotion  Gait Ambulation: No Gait Gait: No Stairs / Additional Locomotion Stairs: No Wheelchair Mobility Wheelchair Mobility: Yes Wheelchair Assistance: Chartered loss adjuster: Both upper extremities Wheelchair Parts Management: Needs assistance Distance: 6'  Trunk/Postural Assessment  Cervical Assessment Cervical Assessment: Exceptions to WFL(forward head with rounded shoulders) Thoracic Assessment Thoracic Assessment: Within Functional Limits Lumbar Assessment Lumbar Assessment: Exceptions to WFL(sitting in posterior pelvic tilt) Postural Control Postural Control: Deficits on evaluation Righting Reactions: delayed  Balance Balance Balance Assessed: Yes Static Sitting Balance Static Sitting - Balance Support: Feet supported;Bilateral upper extremity supported Static Sitting - Level of Assistance: 5: Stand by assistance Dynamic Sitting Balance Dynamic Sitting - Balance Support: Feet supported;During functional  activity Dynamic Sitting - Level of Assistance: 5: Stand by assistance Dynamic Sitting Balance - Compensations: posterior pelvic tilt Static Standing Balance Static Standing - Balance Support: During functional activity;Bilateral upper extremity supported Static Standing - Level of Assistance: 4: Min assist Extremity Assessment  RUE Assessment RUE Assessment: Exceptions to [facility] General Strength Comments: generalized weakness LUE Assessment LUE Assessment: Exceptions to [facility] General Strength Comments: generalized weakness RLE Assessment RLE Assessment: Exceptions to [facility] General Strength Comments: grossly 4/5 throughout LLE Assessment LLE Assessment: Exceptions to [facility] Passive Range of Motion (PROM) Comments: limited d/t pain General Strength Comments: hip flexion 2/5, knee extension 2/5, knee flexion 2/5, ankle dorsiflexion/plantarflexion 4/5    Refer to Care Plan for Long Term Goals  Recommendations for other services: Neuropsych and Therapeutic Recreation  Outing/community reintegration  Discharge Criteria: Patient will be discharged from PT if patient refuses treatment 3 consecutive times without medical reason, if treatment goals not met, if there is a change in medical status, if patient makes no progress towards goals or if patient is discharged from hospital.  The above assessment, treatment plan, treatment alternatives and goals were discussed and mutually agreed upon: by patient and by family  Corliss Skains 12/14/2017, 11:00 AM

## 2017-12-15 ENCOUNTER — Inpatient Hospital Stay (HOSPITAL_COMMUNITY): Payer: Self-pay

## 2017-12-15 ENCOUNTER — Inpatient Hospital Stay (HOSPITAL_COMMUNITY): Payer: Self-pay | Admitting: Physical Therapy

## 2017-12-15 ENCOUNTER — Inpatient Hospital Stay (HOSPITAL_COMMUNITY): Payer: Self-pay | Admitting: Speech Pathology

## 2017-12-15 MED ORDER — BACLOFEN 5 MG HALF TABLET: 5.0000 mg | ORAL_TABLET | Freq: Two times a day (BID) | ORAL | Status: DC | PRN

## 2017-12-15 MED ORDER — PIVOT 1.5 CAL PO LIQD
1000.0000 mL | ORAL | Status: DC
  Administered 2017-12-15 – 2017-12-17 (×3): 1000 mL
  Filled 2017-12-15 (×4): qty 1000

## 2017-12-15 MED ORDER — ENSURE ENLIVE PO LIQD
237.0000 mL | Freq: Two times a day (BID) | ORAL | Status: DC
  Administered 2017-12-15 – 2017-12-17 (×2): 237 mL via ORAL

## 2017-12-15 NOTE — Progress Notes (Signed)
Physical Therapy Session Note  Patient Details  Name: Willie Blevins MRN: [id number] Date of Birth: 21-Dec-1975  Today's Date: 12/15/2017 PT Individual Time: 1032-1100 PT Individual Time Calculation (min): 28 min   Short Term Goals: Week 1:  PT Short Term Goal 1 (Week 1): Pt will perform bed mobility with S PT Short Term Goal 2 (Week 1): Pt will perform transfers with consistent min guard PT Short Term Goal 3 (Week 1): Pt will perform sit <>stand consistent minA PT Short Term Goal 4 (Week 1): Pt will initiate gait training  Skilled Therapeutic Interventions/Progress Updates:   Pt received sitting in WC and agreeable to PT   WC mobility instructed by PT to rehab gym with supervision assist, min cues for doorway management from PT.   Orthostatic vitals:  sitting 100/80 HR 87 Standing 0 min 104/87 HR 125 Standing at 2 min 125/110 HR 135 Pt returned to sitting and Vitals reassessed 110/87, HR 95  Sit<>stand with min assist from PT in parallel bars x 4. Pt noted to have strong use of UE support on rails.   Stepping to target pregressing to to forward backward stepping in parallel bars. Min cues from PT for safety and proper WB through UE to manage pin in BLE  Patient returned to room and left sitting in [facility] with call bell in reach and all needs met.        Therapy Documentation Precautions:  Precautions Precautions: Fall Precaution Comments: unlimited ROM BLE; ROM encouraged Restrictions Weight Bearing Restrictions: Yes RLE Weight Bearing: Weight bearing as tolerated LLE Weight Bearing: Partial weight bearing    Vital Signs: Therapy Vitals Pulse Rate: (!) 112 Resp: 20 Oxygen Therapy SpO2: 98 % O2 Device: Room Air Pain: Pain Assessment Pain Scale: 0-10 Pain Score: 4  Pain Type: Acute pain Pain Location: Leg Pain Orientation: Left Pain Radiating Towards: hip Pain Descriptors / Indicators: Aching Pain Frequency: Constant Pain Onset: On-going Patients Stated Pain  Goal: 6 Pain Intervention(s): Emotional support    Therapy/Group: Individual Therapy  Lorie Phenix 12/15/2017, 11:01 AM

## 2017-12-15 NOTE — Progress Notes (Signed)
Speech Language Pathology Daily Session Note  Patient Details  Name: Willie Blevins MRN: [id number] Date of Birth: 08-07-1975  Today's Date: 12/15/2017  Session 1: SLP Individual Time: 4098-1191 SLP Individual Time Calculation (min): 25 min   Session 2: SLP Individual Time: 4782-9562 SLP Individual Time Calculation (min): 30  Min Missed 30 minutes due to pain   Short Term Goals: Week 1: SLP Short Term Goal 1 (Week 1): Patient will consume thin liquids with minimal overt s/s of aspiration and Mod I for use of swallowing compensatory strategies.  SLP Short Term Goal 2 (Week 1): Patient will utilize speech intelligibility strategies at the sentence level with Mod I to achieve 100% intelligibility.  SLP Short Term Goal 3 (Week 1): Patient will recall new, daily information with Mod I.  SLP Short Term Goal 4 (Week 1): Patinet will dived attention between 2 tasks in a moderatly distracting enviornment for 30 minutes with Mod I.   Skilled Therapeutic Interventions:  Session 1: Skilled treatment session focused on dysphagia and speech goals. Patient decannulated today and SLP provided Max A verbal cues for patient to apply pressure to stoma to reduce air leakage and maximize speech intelligibility at the sentence level to 100%. Patient also had mandibular wires removed but now has rubber bands that continue to restrict oral movement and ability to utilize a spoon for self-feeding.  Patient consumed trials of Dys. 1 textures via syringe with thin liquids via straw without overt s/s of aspiration with prolonged AP transit and mild anterior spillage of saliva. Recommend trial tray prior to upgrade. Patient and wife verbalized understanding and agreement.  Patient left upright in wheelchair with wife present. Continue with current plan of care.   Session 2: Skilled treatment session focused on dysphagia and speech goals. Patient missed initial 30 minutes of session due to oral pain and receiving a"  mouth full of oral gel" but agreeable for SLP to come back. SLP facilitated session by providing skilled observation with lunch meal of Dys. 1 textures with thin liquids via syringe. Patient consumed meal without overt s/s of aspiration but had minimal PO intake due to food tasting like "cat food."  Educated patient and wife to bring food from home per patient's preference as well as smoothies and milkshakes to maximize PO intake. Both verbalized understanding and agreement. Recommend patient upgrade to Dys. 1 textures. Patient independently applied pressure to his stoma throughout session for 100% intelligibility.  Patient left upright in bed with all needs within reach. Continue with current plan of care.      Pain Pain Assessment Pain Scale: 0-10 Pain Score: 4  Pain Type: Acute pain Pain Location: Leg Pain Orientation: Left Pain Radiating Towards: hip Pain Descriptors / Indicators: Aching Pain Frequency: Constant Pain Onset: On-going Patients Stated Pain Goal: 6 Pain Intervention(s): Emotional support  Therapy/Group: Individual Therapy  Gianlucca Szymborski 12/15/2017, 12:51 PM
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
## 2017-12-15 NOTE — Progress Notes (Signed)
Physical Therapy Session Note  Patient Details  Name: Willie Blevins MRN: [id number] Date of Birth: 1975/11/05  Today's Date: 12/15/2017 PT Individual Time: 1300-1330 PT Individual Time Calculation (min): 30 min   Short Term Goals: Week 1:  PT Short Term Goal 1 (Week 1): Pt will perform bed mobility with S PT Short Term Goal 2 (Week 1): Pt will perform transfers with consistent min guard PT Short Term Goal 3 (Week 1): Pt will perform sit <>stand consistent minA PT Short Term Goal 4 (Week 1): Pt will initiate gait training  Skilled Therapeutic Interventions/Progress Updates:    Pt received semi-reclined in bed, agreeable to PT. Pt complains of R facial pain at site of injury, RN aware and provides some Orajel to pt for relief. Semi-reclined to sitting EOB with min A for LLE management. Pt dons R CAM boot dependently and L shoe dependently. Sit to stand x 4 reps from elevated bed to RW with min A. Pt tolerates standing about 30 sec each bout. Pt relies heavily on BUE and reports he feels that his BLE are going to give out on him. Sidesteps to the L towards HOB. Sit to semi-reclined with min A for LLE management. Education with patient and about spouse about importance of mobility for increasing endurance and strength. Pt left semi-reclined in bed with needs in reach, spouse present.  Therapy Documentation Precautions:  Precautions Precautions: Fall Precaution Comments: unlimited ROM BLE; ROM encouraged Restrictions Weight Bearing Restrictions: Yes RLE Weight Bearing: Weight bearing as tolerated LLE Weight Bearing: Weight bearing as tolerated   Therapy/Group: Individual Therapy  Peter Congo, PT, DPT  12/15/2017, 4:14 PM

## 2017-12-15 NOTE — Progress Notes (Signed)
Ortho trauma PA note copied for therapy clarification: Confirmed WBAT status with Montez Morita PA and order modified.    Per recommendations from progress note of 12/12/2017  -Left subtrochanteric femur fracture s/p IMN             WBAT L leg              ROM as tolerated L hip, knee and ankle              Dressing changes as needed L thigh              Ice and elevate for swelling and pain control             PT/OT                Ok to cycle prafo boot on and off throughout the day. Keep on at night                PT/OT                         please teach HEP for L hip, knee and ankle ROM- AROM, PROM. Prone exercises as well. No ROM restrictions.  Quad sets, SLR, LAQ, SAQ, heel slides, stretching, prone flexion and extension                           Ankle theraband program, heel cord stretching, toe towel curls, etc                           No pillows under bend of knee when at rest, ok to place under heel to help work on extension. Can also use zero knee bone foam if available   - open R fibula fracture s/p I&D             WBAT R leg            dressing changes as needed               If needed can swap PRAFO boot out for a CAM to make ambulation easier    - B Lower leg crush injuries             Severe muscle injury to R leg noted during I&D of open fibula fracture             Suspect very similar muscle injury on Left given clinical findings. 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

## 2017-12-15 NOTE — Progress Notes (Signed)
   Subjective/Chief Complaint: Doing well. Trach plugged for days.   Objective: Vital signs in last 24 hours: Temp:  [97.9 F (36.6 C)-98.9 F (37.2 C)] 98.9 F (37.2 C) (10/31 0355) Pulse Rate:  [108-136] 112 (10/31 0749) Resp:  [16-22] 20 (10/31 0749) BP: (109-129)/(60-71) 117/60 (10/31 0355) SpO2:  [94 %-99 %] 98 % (10/31 0749) FiO2 (%):  [21 %] 21 % (10/31 0015) Weight:  [98.1 kg] 98.1 kg (10/31 0355) Last BM Date: 12/14/17  Intake/Output from previous day: 10/30 0701 - 10/31 0700 In: 1260 [P.O.:240; NG/GT:1020] Out: 675 [Urine:675] Intake/Output this shift: No intake/output data recorded.  rubber bands in place. he has good occlusion. trach removed and steristripped closed. wound looked excellent.   Lab Results:  Recent Labs    12/13/17 0157 12/14/17 0604  WBC 14.3* 12.5*  HGB 10.1* 9.7*  HCT 32.5* 32.1*  PLT 696* 709*   BMET Recent Labs    12/14/17 0604  NA 135  K 4.0  CL 101  CO2 25  GLUCOSE 126*  BUN 21*  CREATININE 0.76  CALCIUM 8.3*   PT/INR No results for input(s): LABPROT, INR in the last 72 hours. ABG No results for input(s): PHART, HCO3 in the last 72 hours.  Invalid input(s): PCO2, PO2  Studies/Results: No results found.  Anti-infectives: Anti-infectives (From admission, onward)   Start     Dose/Rate Route Frequency Ordered Stop   12/14/17 0800  cefTRIAXone (ROCEPHIN) 2 g in sodium chloride 0.9 % 100 mL IVPB     2 g 200 mL/hr over 30 Minutes Intravenous Every 24 hours 12/13/17 1545     12/13/17 1600  cefTRIAXone (ROCEPHIN) 2 g in sodium chloride 0.9 % 100 mL IVPB  Status:  Discontinued     2 g 200 mL/hr over 30 Minutes Intravenous Every 24 hours 12/13/17 1434 12/13/17 1545      Assessment/Plan: s/p * No surgery found * trach removed. he can eat very soft and full liquid diet. he will change rubber bands as they break.   LOS: 2 days    Suzanna Obey 12/15/2017

## 2017-12-15 NOTE — Progress Notes (Signed)
Patient's trach removed by MD. Patient tolerating well at this time. RT will continue to monitor.

## 2017-12-15 NOTE — Progress Notes (Signed)
Willie Blevins PHYSICAL MEDICINE & REHABILITATION PROGRESS NOTE   Subjective/Complaints: Pt had reasonable night. Pain in face, lower ext. ENT in to see pt to remove trach, removed wires yesterday  ROS: Patient denies fever, rash, sore throat, blurred vision, nausea, vomiting, diarrhea, cough, shortness of breath or chest pain, joint or back pain, headache, or mood change.    Objective:   No results found. Recent Labs    12/13/17 0157 12/14/17 0604  WBC 14.3* 12.5*  HGB 10.1* 9.7*  HCT 32.5* 32.1*  PLT 696* 709*   Recent Labs    12/14/17 0604  NA 135  K 4.0  CL 101  CO2 25  GLUCOSE 126*  BUN 21*  CREATININE 0.76  CALCIUM 8.3*    Intake/Output Summary (Last 24 hours) at 12/15/2017 1047 Last data filed at 12/15/2017 0700 Gross per 24 hour  Intake 1260 ml  Output 675 ml  Net 585 ml     Physical Exam: Vital Signs Blood pressure 117/60, pulse (!) 112, temperature 98.9 F (37.2 C), temperature source Axillary, resp. rate 20, height 6\' 2"  (1.88 m), weight 98.1 kg, SpO2 98 %. Constitutional: No distress . Vital signs reviewed. HEENT: EOMI, oral membranes moist. Arch bar in place. Opening mouth now Neck: supple Cardiovascular: RRR without murmur. No JVD    Respiratory: CTA Bilaterally without wheezes or rales. Normal effort    GI: BS +, non-tender, non-distended  Musculoskeletal: 1+ edema left thigh down to 2+ left foot. Multiple sutures LLE with scabbed abrasions on left lateral shin. Right calf lac with sutures, scab--dry. 1+ Edema RLE. Left chest wall/back tender. Wounds stable Neurological: He isalertand oriented to person, place, and time. Right facial weakness with slurred speech due to wires. Able to follow basic commands without difficulty. Right peripheral 7 with inability to close right eye lids and right facial droop. Fair awareness and insight.. Moves UE   4/5. Lifts RLE easily against gravity. wiggles toes on left. Difficulty lifting left leg (pain). Normal  sensation Skin: Skin iswarmand dry. He isnot diaphoretic.  Psychiatric: cooperative, perhaps a little anxious.    Assessment/Plan: 1. 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  Physiatrist is providing close team supervision and 24 hour management of active medical problems listed below.  Physiatrist and rehab team continue to assess barriers to discharge/monitor patient progress toward functional and medical goals  Care Tool:  Bathing    Body parts bathed by patient: Right arm, Left arm, Chest, Abdomen, Front perineal area, Right upper leg, Left upper leg, Face   Body parts bathed by helper: Buttocks, Left lower leg, Right lower leg     Bathing assist Assist Level: Moderate Assistance - Patient 50 - 74%     Upper Body Dressing/Undressing Upper body dressing   What is the patient wearing?: Pull over shirt    Upper body assist Assist Level: Contact Guard/Touching assist    Lower Body Dressing/Undressing Lower body dressing      What is the patient wearing?: Underwear/pull up     Lower body assist Assist for lower body dressing: Maximal Assistance - Patient 25 - 49%     Toileting Toileting    Toileting assist Assist for toileting: Maximal Assistance - Patient 25 - 49%     Transfers Chair/bed transfer  Transfers assist  Chair/bed transfer activity did not occur: Safety/medical concerns  Chair/bed transfer assist level: Moderate Assistance - Patient 50 - 74%  Locomotion Ambulation   Ambulation assist   Ambulation activity did not occur: Safety/medical concerns  Assist level: Moderate Assistance - Patient 50 - 74%   Max distance: 5   Walk 10 feet activity   Assist  Walk 10 feet activity did not occur: Safety/medical concerns        Walk 50 feet activity   Assist Walk 50 feet with 2 turns activity did not occur: Safety/medical  concerns         Walk 150 feet activity   Assist Walk 150 feet activity did not occur: Safety/medical concerns         Walk 10 feet on uneven surface  activity   Assist Walk 10 feet on uneven surfaces activity did not occur: Safety/medical concerns         Wheelchair     Assist Will patient use wheelchair at discharge?: Yes Type of Wheelchair: Manual    Wheelchair assist level: Supervision/Verbal cueing Max wheelchair distance: 75    Wheelchair 50 feet with 2 turns activity    Assist        Assist Level: Supervision/Verbal cueing   Wheelchair 150 feet activity     Assist Wheelchair 150 feet activity did not occur: Safety/medical concerns(fatigue)      Medical Problem List and Plan: 1.Functional deficitssecondary to TBI/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 DVT Prophylaxis/Anticoagulation: Pharmaceutical:Lovenox 3. Pain Management:Continue scheduled tylenol, gabapentin and robaxin. Oxycodone prn for severe pain.  -added kpad, lidoderm patches for chest wall discomfort  -better control 4. Mood:LCSW to follow for evaluation and support. 5. Neuropsych: This patientis not fullycapable of making decisions on hisown behalf. 6. Skin/Wound Care:Monitor wounds for healing. Continue nutritional supplements to promote healing. 7. Fluids/Electrolytes/Nutrition:Monitor I/O. Calorie ct/ TF   -protein deficient----increase protein intake  -with wires, trach out---can advance soft diet as tolerated 8.Acute hypoxic respiratory failure:Tolerating T collar.Downsized to CFS#6 on 10/28 -trach removed today by ENT  -consider decanulation from 6 vs downsize to 4 once manibular wires come out, ENT also following 9. 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 ENT saw today 11.Bilateral LLE crush injuries with left septic femur fracture and right open fibula fracture with extensive muscle injury: PWB on RLE and WBAT LLE.  12.Right facial and mandible fracture status post ORIF mandible 10/17:  13.Acute stress disorder with anxiety/PTSD:   -Started on prazosin for nightmares. bp holding  -Psychiatry recommends adding Lexapro if prazosin or Atarax ineffective   14. Aspiration PNA: Leucocytosis resolving. Continue Rocephin added 10/20 for 22 day course.   -neuropsych to see here  -wbc's falling steadily 15. ABLA: Continue to monitor for recovery. 9.7 on 10/30 16. Rhabdomyolysis: CK improving 1815-->4575--> 1,963.added water flushes. Encourage fuid intake.  17. Abnormal LFTs: returning to near normal range (10/30)       LOS: 2 days A FACE TO FACE EVALUATION WAS PERFORMED  Willie Blevins 12/15/2017, 10:47 AM

## 2017-12-15 NOTE — Progress Notes (Signed)
S: Has been moved to 4W-12 rehab unit. Continuing to improve. No postauricular drainage or pain reported.  O: Awake and alert, very conversational. VS stable, wife in room. Dr. Jearld Fenton at bedside to decannulate tracheostomy.      R postauricular incision closed. No drainage or residual suture abscess. No cellulitis.      No drainage from R EAC      Continues with complete R facial paralysis as expected. Eyelids taped shut.  A: 1. R postauricular abscess resolved with I&D and Rocephin 2. Being decannulated today by Dr. Jearld Fenton 3. For facial rehabilitation, if the patient desires, he will eventually be offered R upper eyelid gold weights/lateral     tarsorrhaphy, right 12-7 procedure, facial sling procedure, cross-face grafting, etc. He may elect to only proceed with right gold weight procedure. Right eye protection will be paramount.  4. For hearing improvement, if the patient desires and depending on audiometric assessment/cochlear reserve AD, he will be given options for doing nothing, using hearing aid amplification, considering an osseointegrated hearing implant Montez Morita or [place]), or proceeding with a right revision tympanoplasty with K-Helix Piston, malleus-to-stapes or traditional PORP ossiculoplasty.  P: 1. Once patient is discharged, he will need to follow up with me in my office for formal audiometric testing and follow up of the tympanomastoidectomy with tympanoplasty. 2. Continue Ciprodex drops 3 gtts AD BID x 10 days. 3. Will sign off. Please reconsult as needed.  Thanks for the opportunity to help care for Willie Blevins.  Carolan Shiver, M.D. Office: [phone] Cell:     [phone]

## 2017-12-15 NOTE — Progress Notes (Signed)
Physical Therapy Session Note  Patient Details  Name: Willie Blevins [name] MRN: [id number] Date of Birth: 1975/07/26  Today's Date: 12/15/2017 PT Individual Time: 0800-0830 PT Individual Time Calculation (min): 30 min   Short Term Goals: Week 1:  PT Short Term Goal 1 (Week 1): Pt will perform bed mobility with S PT Short Term Goal 2 (Week 1): Pt will perform transfers with consistent min guard PT Short Term Goal 3 (Week 1): Pt will perform sit <>stand consistent minA PT Short Term Goal 4 (Week 1): Pt will initiate gait training  Skilled Therapeutic Interventions/Progress Updates:    pt in bed with nursing administering meds. Pt agreeable to therapy. Pt performs supine to sit with min A for LT LE.  Sitting edge of bed pt requires min A to don abdominal binder.  Pt performs sit <> stand x 3 with mod A and RW, pt with no c/o dizziness, HR noted to be 130bpm with standing.  Pt then performs side stepping x 5' with RW and mod A.  Stand pivot transfer to recliner with mod A with RW.  Pt left in recliner with needs at hand, wife present.  Therapy Documentation Precautions:  Precautions Precautions: Fall Precaution Comments: unlimited ROM BLE; ROM encouraged Restrictions Weight Bearing Restrictions: Yes RLE Weight Bearing: Weight bearing as tolerated LLE Weight Bearing: Partial weight bearing Vital Signs: HR 120-130 during activity Pain:  Pt c/o pain in Lt LE from hip to feet, RN present and administering meds    Therapy/Group: Individual Therapy  Ernestine Rohman 12/15/2017, 9:27 AM

## 2017-12-15 NOTE — Progress Notes (Signed)
Occupational Therapy Session Note  Patient Details  Name: Willie Blevins MRN: [id number] Date of Birth: 1976-02-07  Today's Date: 12/15/2017 OT Individual Time:Session 1: 845-940 Session 2:  1100-1115 OT Individual Time Calculation (min): Session 1: 55 min Session 2 15 min Today's Date: 12/15/2017 OT Missed Time: 15 Minutes Missed Time Reason: Other (comment)(discussion with PA re clarification of WB status)   Short Term Goals: Week 1:  OT Short Term Goal 1 (Week 1): Pt will thread B LE through pants with min A OT Short Term Goal 2 (Week 1): Pt will complete stand pivot transfer to [facility] with min A OT Short Term Goal 3 (Week 1): Pt will complete grooming task EOB with improved cardiovascular endurance, as evidenced by no change in vital signs  OT Short Term Goal 4 (Week 1): Pt will don shirt with (S) sitting EOB  Skilled Therapeutic Interventions/Progress Updates:    Session 1:  Session focused on b/d tasks at sink level. Pt transferred from recliner to w/c with min A and vc for UE placement/technique. Pt able to wash UB with CGA only. Min A to don shirt d/t NG tube. Skilled monitoring of vitals throughout session for hemodynamic and cardiac stability- with abdominal binder removed during bathing pt's BP dropped from 114/69 to 104/71, with pt asymptomatic. Pt's O2 levels remained >95% throughout session, however HR remained tachycardic between 130-140 BPM in sitting. Pt still very reliant on wife completing LB bathing/dressing, requiring max A to don pants and to complete posterior peri hygiene following dressing. Pt transferred to recliner and was left sitting up with wife present and all needs met.   Session 2: Session focused on pt and family questions re clarification of WB status. Pt given demo re donning/doffing of cam boot with pt's wife returning demo. Clarification with PA re PWB status of L LE per family request- with change to B LE WBAT. Pt missed 15 min d/t care coordination  completed with PA and nursing staff.  Therapy Documentation Precautions:  Precautions Precautions: Fall Precaution Comments: unlimited ROM BLE; ROM encouraged Restrictions Weight Bearing Restrictions: Yes RLE Weight Bearing: Weight bearing as tolerated LLE Weight Bearing: Weight bearing as tolerated General: General OT Amount of Missed Time: 15 Minutes Vital Signs: Therapy Vitals Temp: 98.3 F (36.8 C) Temp Source: Oral Pulse Rate: (!) 102 Resp: 20 BP: 116/74 Patient Position (if appropriate): Sitting Oxygen Therapy SpO2: (!) 89 % O2 Device: Room Air Pain: Pain Assessment Pain Scale: 0-10 Pain Score: 4  Pain Type: Acute pain Pain Location: Leg Pain Orientation: Left Pain Descriptors / Indicators: Aching Pain Onset: On-going Pain Intervention(s): Emotional support;Distraction   Therapy/Group: Individual Therapy  Curtis Sites 12/15/2017, 3:09 PM

## 2017-12-15 NOTE — Progress Notes (Signed)
Pt asleep at time of RT trach check. No obvious respiratory distress noted at this time. RT Will continue to monitor.

## 2017-12-16 ENCOUNTER — Inpatient Hospital Stay (HOSPITAL_COMMUNITY): Payer: Self-pay | Admitting: Speech Pathology

## 2017-12-16 ENCOUNTER — Inpatient Hospital Stay (HOSPITAL_COMMUNITY): Payer: Self-pay | Admitting: Physical Therapy

## 2017-12-16 ENCOUNTER — Inpatient Hospital Stay (HOSPITAL_COMMUNITY): Payer: Self-pay

## 2017-12-16 MED ORDER — FLORANEX PO PACK
1.0000 g | PACK | Freq: Three times a day (TID) | ORAL | Status: DC
  Administered 2017-12-16 – 2017-12-23 (×17): 1 g via ORAL
  Filled 2017-12-16 (×24): qty 1

## 2017-12-16 MED ORDER — GABAPENTIN 250 MG/5ML PO SOLN
600.0000 mg | Freq: Three times a day (TID) | ORAL | Status: DC
  Administered 2017-12-16 – 2017-12-19 (×9): 600 mg
  Filled 2017-12-16 (×11): qty 12

## 2017-12-16 MED ORDER — FAMOTIDINE 40 MG/5ML PO SUSR
20.0000 mg | Freq: Two times a day (BID) | ORAL | Status: DC
  Administered 2017-12-16 – 2017-12-21 (×11): 20 mg via ORAL
  Filled 2017-12-16 (×12): qty 2.5

## 2017-12-16 MED ORDER — CALCIUM POLYCARBOPHIL 625 MG PO TABS
625.0000 mg | ORAL_TABLET | Freq: Two times a day (BID) | ORAL | Status: DC
  Administered 2017-12-16 – 2017-12-24 (×15): 625 mg via ORAL
  Filled 2017-12-16 (×16): qty 1

## 2017-12-16 MED ORDER — BETHANECHOL CHLORIDE 10 MG PO TABS
10.0000 mg | ORAL_TABLET | Freq: Three times a day (TID) | ORAL | Status: AC
  Administered 2017-12-16 – 2017-12-17 (×3): 10 mg
  Filled 2017-12-16 (×3): qty 1

## 2017-12-16 NOTE — Care Management (Signed)
[facility] Individual Statement of Services  Patient Name:  Willie Blevins [name]  Date:  12/16/2017  Welcome to the [facility].  Our goal is to provide you with an individualized program based on your diagnosis and situation, designed to meet your specific needs.  With this comprehensive rehabilitation program, you will be expected to participate in at least 3 hours of rehabilitation therapies Monday-Friday, with modified therapy programming on the weekends.  Your rehabilitation program will include the following services:  Physical Therapy (PT), Occupational Therapy (OT), Speech Therapy (ST), 24 hour per day rehabilitation nursing, Therapeutic Recreaction (TR), Neuropsychology, Case Management (Social Worker), Rehabilitation Medicine, Nutrition Services and Pharmacy Services  Weekly team conferences will be held on Tuesday to discuss your progress.  Your Social Worker will talk with you frequently to get your input and to update you on team discussions.  Team conferences with you and your family in attendance may also be held.  Expected length of stay: 12-18 days   Overall anticipated outcome: minimal assistance  Depending on your progress and recovery, your program may change. Your Social Worker will coordinate services and will keep you informed of any changes. Your Social Worker's name and contact numbers are listed  below.  The following services may also be recommended but are not provided by the [facility]:   Driving Evaluations  Home Health Rehabiltiation Services  Outpatient Rehabilitation Services  Vocational Rehabilitation   Arrangements will be made to provide these services after discharge if needed.  Arrangements include referral to agencies that provide these services.  Your insurance has been verified to be:  Worker's Comp Your primary doctor is:  none  Pertinent information will be shared with your doctor and  your insurance company.  Social Worker:  [place], [place] [phone] or [phone]   Information discussed with and copy given to patient by: Amada Jupiter, 12/16/2017, 9:51 AM

## 2017-12-16 NOTE — Progress Notes (Signed)
Occupational Therapy Session Note  Patient Details  Name: Willie Blevins MRN: [id number] Date of Birth: 06-Dec-1975  Today's Date: 12/16/2017 OT Individual Time: 0945-1100 OT Individual Time Calculation (min): 75 min    Short Term Goals: Week 1:  OT Short Term Goal 1 (Week 1): Pt will thread B LE through pants with min A OT Short Term Goal 2 (Week 1): Pt will complete stand pivot transfer to [facility] with min A OT Short Term Goal 3 (Week 1): Pt will complete grooming task EOB with improved cardiovascular endurance, as evidenced by no change in vital signs  OT Short Term Goal 4 (Week 1): Pt will don shirt with (S) sitting EOB  Skilled Therapeutic Interventions/Progress Updates:    Session focused on functional activity tolerance and static/dynamic standing balance. Pt completed w/c propulsion down to therapy gym, 100 ft, with (S). Pt transferred to mat with RW with min A. In standing, pt able to remove 1 UE from RW to complete table top task, in several intervals, requiring seated rest breaks every ~1 min. Pt was able to progress to remove B UE from RW with CGA provided for 30 sec- 1 minute at a time. Pt became frustrated with task and endurance deficits. Pt then played Wii Bowling for dynamic standing balance, completed 8x sit to stand and swinging arm. Pt returned to bed and was left supine with all needs met.   Therapy Documentation Precautions:  Precautions Precautions: Fall Precaution Comments: unlimited ROM BLE; ROM encouraged Restrictions Weight Bearing Restrictions: Yes RLE Weight Bearing: Weight bearing as tolerated LLE Weight Bearing: Weight bearing as tolerated Pain: Pain Assessment Pain Scale: 0-10 Pain Score: 7  Pain Type: Acute pain Pain Location: Leg Pain Orientation: Left Pain Descriptors / Indicators: Aching Pain Onset: On-going Patients Stated Pain Goal: 6 Pain Intervention(s): Ambulation/increased activity;Emotional support   Therapy/Group: Individual  Therapy  Curtis Sites 12/16/2017, 12:30 PM

## 2017-12-16 NOTE — Progress Notes (Signed)
Speech Language Pathology Daily Session Note  Patient Details  Name: Willie Blevins MRN: [id number] Date of Birth: 10/17/1975  Today's Date: 12/16/2017 SLP Individual Time: 4098-1191 SLP Individual Time Calculation (min): 38 min  Short Term Goals: Week 1: SLP Short Term Goal 1 (Week 1): Patient will consume thin liquids with minimal overt s/s of aspiration and Mod I for use of swallowing compensatory strategies.  SLP Short Term Goal 2 (Week 1): Patient will utilize speech intelligibility strategies at the sentence level with Mod I to achieve 100% intelligibility.  SLP Short Term Goal 3 (Week 1): Patient will recall new, daily information with Mod I.  SLP Short Term Goal 4 (Week 1): Patinet will dived attention between 2 tasks in a moderatly distracting enviornment for 30 minutes with Mod I.   Skilled Therapeutic Interventions: Skilled treatment session focused on dysphagia, speech and cognitive goals. Upon arrival, patient reported he needed to rubber bands replaced and was able to direct care for staff to perform task with Mod I. Patient consumed Dys. 1 textures via syringe and thin liquids via straw without overt s/s of aspiration with Mod I. Patient also independently applied pressure to his stoma to maximize intelligibility at the conversation level to 100%. Patient asking appropriate questions about medication administration. Patient, RN and clinician generated a plan to administer medications to minimize amount of times patient has to swallow (patient requests no more than 4 swallows of medicine). Patient requested for plan to start on Monday. Patient left upright in bed with alarm on and family present. Continue with current plan of care.      Pain No/Denies Pain   Therapy/Group: Individual Therapy  Willie Blevins 12/16/2017, 2:38 PM

## 2017-12-16 NOTE — IPOC Note (Signed)
Overall Plan of Care [facility]) Patient Details Name: Willie Blevins [name] MRN: [id number] DOB: 14-Dec-1975  Admitting Diagnosis: <principal problem not specified>  Hospital Problems: Active Problems:   Skull fracture with cerebral contusion (HCC)   Aspiration pneumonia of right lower lobe due to vomit (HCC)   Tracheostomy status (HCC)   PTSD (post-traumatic stress disorder)     Functional Problem List: Nursing Pain, Nutrition, Bladder, Edema, Safety, Medication Management, Skin Integrity  PT Balance, Motor, Edema, Endurance, Pain, Safety, Skin Integrity  OT Balance, Cognition, Safety, Edema, Endurance, Sensory, Motor, Skin Integrity, Pain, Vision, Behavior  SLP Cognition  TR         Basic ADL's: OT Eating, Grooming, Bathing, Dressing, Toileting     Advanced  ADL's: OT       Transfers: PT Bed Mobility, Bed to Chair, Car, Occupational psychologist, Research scientist (life sciences): PT Ambulation, Psychologist, prison and probation services     Additional Impairments: OT None  SLP Swallowing, Communication, Social Cognition expression Memory, Attention  TR      Anticipated Outcomes Item Anticipated Outcome  Self Feeding mod I  Swallowing  Mod I   Basic self-care  (S)  Toileting  (S)   Bathroom Transfers (S)  Bowel/Bladder  manage with min assist  Transfers  S  Locomotion  S overall w/c level; controlled environment gait goal minA x10'  Communication  Mod I  Cognition  Mod I   Pain  at or below level 6  Safety/Judgment  maintain safety with cues/reminders   Therapy Plan: PT Intensity: Minimum of 1-2 x/day ,45 to 90 minutes PT Frequency: 5 out of 7 days PT Duration Estimated Length of Stay: 12-14 days OT Intensity: Minimum of 1-2 x/day, 45 to 90 minutes OT Frequency: 5 out of 7 days OT Duration/Estimated Length of Stay: 14-18 days SLP Intensity: Minumum of 1-2 x/day, 30 to 90 minutes SLP Frequency: 3 to 5 out of 7 days SLP Duration/Estimated Length of Stay: 12-14 days     Team  Interventions: Nursing Interventions Patient/Family Education, Skin Care/Wound Management, Discharge Planning, Bladder Management, Pain Management, Medication Management, Dysphagia/Aspiration Precaution Training  PT interventions Ambulation/gait training, Discharge planning, Psychosocial support, Functional mobility training, Therapeutic Activities, Wheelchair propulsion/positioning, Therapeutic Exercise, Skin care/wound management, Disease management/prevention, Neuromuscular re-education, Balance/vestibular training, Cognitive remediation/compensation, DME/adaptive equipment instruction, Pain management, Splinting/orthotics, UE/LE Strength taining/ROM, UE/LE Coordination activities, Patient/family education, Functional electrical stimulation, Community reintegration  OT Interventions Warden/ranger, Cognitive remediation/compensation, Community reintegration, Disease mangement/prevention, Self Care/advanced ADL retraining, Patient/family education, Therapeutic Exercise, Wheelchair propulsion/positioning, UE/LE Coordination activities, Discharge planning, DME/adaptive equipment instruction, Functional mobility training, Pain management, Psychosocial support, Skin care/wound managment, UE/LE Strength taining/ROM, Therapeutic Activities, Visual/perceptual remediation/compensation  SLP Interventions Cognitive remediation/compensation, Environmental controls, Internal/external aids, Speech/Language facilitation, Therapeutic Activities, Patient/family education, Functional tasks, Dysphagia/aspiration precaution training, Cueing hierarchy  TR Interventions    SW/CM Interventions Discharge Planning, Psychosocial Support, Patient/Family Education   Barriers to Discharge MD  Medical stability  Nursing      PT Weight bearing restrictions    OT      SLP      SW       Team Discharge Planning: Destination: PT-Home ,OT- Home , SLP-Home Projected Follow-up: PT-Home health PT, OT-  Home health  OT, SLP-(TBD) Projected Equipment Needs: PT-To be determined, OT- To be determined, SLP-None recommended by SLP Equipment Details: PT-has none; likely will need w/c, RW, ramp for home entry, OT-  Patient/family involved in discharge planning: PT- Patient, Family member/caregiver,  OT-Patient, Family member/caregiver, SLP-Patient,  Family member/caregiver  MD ELOS: 12-14 days Medical Rehab Prognosis:  Excellent Assessment: The patient has been admitted for CIR therapies with the diagnosis of polytrauma, TBI. The team will be addressing functional mobility, strength, stamina, balance, safety, adaptive techniques and equipment, self-care, bowel and bladder mgt, patient and caregiver education, pain mgt, wound care, ego support, nutrition, swallowing, communication. Goals have been set at supervision for mobility and self-care and mod I for cognition, communication, and swallowing.    Ranelle Oyster, MD, FAAPMR      See Team Conference Notes for weekly updates to the plan of care

## 2017-12-16 NOTE — Progress Notes (Signed)
[place] PHYSICAL MEDICINE & REHABILITATION PROGRESS NOTE   Subjective/Complaints: Noticing some pain in the right side of his face, shooting, sometimes spasms. Has burning pain in left foot. Able to sleep last night. Doesn't like food offered him. Stomach upset last night.  ROS: Patient denies fever, rash, sore throat, blurred vision, nausea, vomiting, diarrhea, cough, shortness of breath or chest pain,   headache, or mood change.  Objective:   Vas [place] Lower Extremity Venous (dvt)  Result Date: 12/15/2017  Lower Venous Study Indications: Pain.  Limitations: Immobility. Performing Technologist: Chanda Busing RVT  Examination Guidelines: A complete evaluation includes B-mode imaging, spectral Doppler, color Doppler, and power Doppler as needed of all accessible portions of each vessel. Bilateral testing is considered an integral part of a complete examination. Limited examinations for reoccurring indications may be performed as noted.  Right Venous Findings: +---------+---------------+---------+-----------+----------+-------+          CompressibilityPhasicitySpontaneityPropertiesSummary +---------+---------------+---------+-----------+----------+-------+ CFV      Full           Yes      Yes                          +---------+---------------+---------+-----------+----------+-------+ SFJ      Full                                                 +---------+---------------+---------+-----------+----------+-------+ FV Prox  Full                                                 +---------+---------------+---------+-----------+----------+-------+ FV Mid   Full                                                 +---------+---------------+---------+-----------+----------+-------+ FV DistalFull                                                 +---------+---------------+---------+-----------+----------+-------+ PFV      Full                                                  +---------+---------------+---------+-----------+----------+-------+ POP      Full           Yes      Yes                          +---------+---------------+---------+-----------+----------+-------+ PTV      Full                                                 +---------+---------------+---------+-----------+----------+-------+ PERO     Full                                                 +---------+---------------+---------+-----------+----------+-------+  Left Venous Findings: +---------+---------------+---------+-----------+----------+-------+          CompressibilityPhasicitySpontaneityPropertiesSummary +---------+---------------+---------+-----------+----------+-------+ CFV      Full           Yes      Yes                          +---------+---------------+---------+-----------+----------+-------+ SFJ      Full                                                 +---------+---------------+---------+-----------+----------+-------+ FV Prox  Full                                                 +---------+---------------+---------+-----------+----------+-------+ FV Mid   Full                                                 +---------+---------------+---------+-----------+----------+-------+ FV DistalFull                                                 +---------+---------------+---------+-----------+----------+-------+ PFV      Full                                                 +---------+---------------+---------+-----------+----------+-------+ POP      Full           Yes      Yes                          +---------+---------------+---------+-----------+----------+-------+ PTV      Full                                                 +---------+---------------+---------+-----------+----------+-------+ PERO     Full                                                  +---------+---------------+---------+-----------+----------+-------+    Summary: Right: There is no evidence of deep vein thrombosis in the lower extremity. No cystic structure found in the popliteal fossa. Left: There is no evidence of deep vein thrombosis in the lower extremity. No cystic structure found in the popliteal fossa.  *See table(s) above for measurements and observations. Electronically signed by Sherald Hess MD on 12/15/2017 at 6:07:38 PM.    Final    Recent Labs    12/14/17 0604  WBC 12.5*  HGB 9.7*  HCT 32.1*  PLT 709*   Recent Labs    12/14/17 0604  NA 135  K 4.0  CL 101  CO2 25  GLUCOSE 126*  BUN 21*  CREATININE 0.76  CALCIUM 8.3*    Intake/Output Summary (Last 24 hours) at 12/16/2017 1250 Last data filed at 12/16/2017 0930 Gross per 24 hour  Intake 1400 ml  Output 750 ml  Net 650 ml     Physical Exam: Vital Signs Blood pressure (!) 106/56, pulse 100, temperature 97.8 F (36.6 C), temperature source Axillary, resp. rate 12, height 6\' 2"  (1.88 m), weight 98.1 kg, SpO2 90 %. Constitutional: No distress . Vital signs reviewed. HEENT: EOMI, oral membranes moist, bands/arch bar, right ear site clean Neck: supple Cardiovascular: RRR without murmur. No JVD    Respiratory: CTA Bilaterally without wheezes or rales. Normal effort    GI: BS +, non-tender, non-distended  Musculoskeletal: 1+ edema left thigh down to 1-2+ left foot. Multiple sutures LLE with scabbed abrasions on left lateral shin. Right calf lac with sutures, scab--dry. 1+ Edema RLE. Left chest wall/back tender. Wounds stable Neurological: He isalertand oriented to person, place, and time. Right facial weakness. Speech surred.  Lifts RLE easily against gravity. wiggles toes on left. LLE limited. Normal sensation Skin: Skin iswarmand dry. He isnot diaphoretic.  Psychiatric: cooperative, calm    Assessment/Plan: 1. Functional deficits secondary to TBI/polytrauma which require 3+ hours  per day of interdisciplinary therapy in a comprehensive inpatient rehab setting.  Physiatrist is providing close team supervision and 24 hour management of active medical problems listed below.  Physiatrist and rehab team continue to assess barriers to discharge/monitor patient progress toward functional and medical goals  Care Tool:  Bathing    Body parts bathed by patient: Right arm, Left arm, Chest, Abdomen, Front perineal area, Right upper leg, Left upper leg, Face   Body parts bathed by helper: Buttocks, Left lower leg, Right lower leg     Bathing assist Assist Level: Moderate Assistance - Patient 50 - 74%     Upper Body Dressing/Undressing Upper body dressing   What is the patient wearing?: Pull over shirt    Upper body assist Assist Level: Contact Guard/Touching assist    Lower Body Dressing/Undressing Lower body dressing      What is the patient wearing?: Underwear/pull up     Lower body assist Assist for lower body dressing: Maximal Assistance - Patient 25 - 49%     Toileting Toileting    Toileting assist Assist for toileting: Minimal Assistance - Patient > 75%(wife assisting with BSC)     Transfers Chair/bed transfer  Transfers assist  Chair/bed transfer activity did not occur: Safety/medical concerns  Chair/bed transfer assist level: Moderate Assistance - Patient 50 - 74%     Locomotion Ambulation   Ambulation assist   Ambulation activity did not occur: Safety/medical concerns  Assist level: Moderate Assistance - Patient 50 - 74%   Max distance: 5   Walk 10 feet activity   Assist  Walk 10 feet activity did not occur: Safety/medical concerns        Walk 50 feet activity   Assist Walk 50 feet with 2 turns activity did not occur: Safety/medical concerns         Walk 150 feet activity   Assist Walk 150 feet activity did not occur: Safety/medical concerns         Walk 10 feet on uneven surface  activity   Assist Walk 10  feet on uneven surfaces activity did not occur: Safety/medical concerns         Wheelchair  Assist Will patient use wheelchair at discharge?: Yes Type of Wheelchair: Manual    Wheelchair assist level: Supervision/Verbal cueing Max wheelchair distance: 75    Wheelchair 50 feet with 2 turns activity    Assist        Assist Level: Supervision/Verbal cueing   Wheelchair 150 feet activity     Assist Wheelchair 150 feet activity did not occur: Safety/medical concerns(fatigue)      Medical Problem List and Plan: 1.Functional deficitssecondary to TBI/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 DVT Prophylaxis/Anticoagulation: Pharmaceutical:Lovenox 3. Pain Management:Continue scheduled tylenol and robaxin. Oxycodone prn for severe pain.  -added kpad, lidoderm patches for chest wall discomfort  -better control  -increase gabapentin to 600mg  tid 4. Mood:LCSW to follow for evaluation and support. 5. Neuropsych: This patientis not fullycapable of making decisions on hisown behalf. 6. Skin/Wound Care:Monitor wounds for healing. Continue nutritional supplements to promote healing. 7. Fluids/Electrolytes/Nutrition:Monitor I/O. Calorie ct/ TF   -protein deficient----increase protein intake  -with wires, trach out---D1 thins. Can bring food from home 8.Acute hypoxic respiratory failure:Tolerating T collar.Downsized to CFS#6 on 10/28 -trach removed 10/31 by ENT, compressive dressing to stoma. 9. 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 ENT saw today 11.Bilateral LLE crush injuries with left septic femur fracture and right open fibula fracture with extensive  muscle injury: PWB on RLE and WBAT LLE.  12.Right facial and mandible fracture status post ORIF mandible 10/17:  13.Acute stress disorder with anxiety/PTSD:   -Started on prazosin for nightmares. bp holding  -Psychiatry recommends adding Lexapro if prazosin or Atarax ineffective   14. Aspiration PNA: Leucocytosis resolving. Continue Rocephin added 10/20 for 22 day course.   -wbc's falling steadily---recheck next week 15. ABLA: Continue to monitor for recovery. 9.7 on 10/30 16. Rhabdomyolysis: CK improving 1815-->4575--> 1,963.added water flushes.  -continue to push fluids  -follow up ck/bmet next week  17. Abnormal LFTs: returning to near normal range (10/30)       LOS: 3 days A FACE TO FACE EVALUATION WAS PERFORMED  Ranelle Oyster 12/16/2017, 12:50 PM

## 2017-12-16 NOTE — Progress Notes (Signed)
Social Work Social Work Assessment and Plan  Patient Details  Name: Willie Blevins [name] MRN: [id number] Date of Birth: August 02, 1975  Today's Date: 12/16/2017  Problem List:  Patient Active Problem List   Diagnosis Date Noted  . Skull fracture with cerebral contusion (HCC) 12/13/2017  . Aspiration pneumonia of right lower lobe due to vomit (HCC)   . Tracheostomy status (HCC)   . PTSD (post-traumatic stress disorder)   . Acute stress disorder   . Closed extensive facial fractures (HCC)   . Fracture   . Liver injury, laceration   . Trauma   . Traumatic brain injury with loss of consciousness (HCC)   . Pulmonary emphysema (HCC)   . Hyperglycemia   . Post-op pain   . Hyponatremia   . Crush injury, leg, lower, bilateral  12/02/2017  . Open right fibular fracture 12/02/2017  . Closed left subtrochanteric femur fracture (HCC) 11/29/2017   Past Medical History:  Past Medical History:  Diagnosis Date  . Bullous emphysema (HCC)   . COPD (chronic obstructive pulmonary disease) (HCC)   . GERD (gastroesophageal reflux disease)   . Open right fibular fracture 12/02/2017  . Strabismus    with lazy eye (left)  . Traumatic iritis    Past Surgical History:  Past Surgical History:  Procedure Laterality Date  . APPLICATION OF WOUND VAC Right 11/29/2017   Procedure: APPLICATION OF WOUND VAC;  Surgeon: Myrene Galas, MD;  Location: [facility] OR;  Service: Orthopedics;  Laterality: Right;  . FACIAL LACERATION REPAIR Left 11/29/2017   Procedure: ear LACERATION REPAIR;  Surgeon: Violeta Gelinas, MD;  Location: [facility] OR;  Service: General;  Laterality: Left;  . I&D EXTREMITY Right 11/29/2017   Procedure: IRRIGATION AND DEBRIDEMENT EXTREMITY;  Surgeon: Myrene Galas, MD;  Location: [facility] OR;  Service: Orthopedics;  Laterality: Right;  . I&D EXTREMITY Right 12/01/2017   Procedure: IRRIGATION AND DEBRIDEMENT RIGHT LOWER EXTREMITY AND VAC CHANGE;  Surgeon: Myrene Galas, MD;  Location: [facility] OR;  Service:  Orthopedics;  Laterality: Right;  . INTRAMEDULLARY (IM) NAIL INTERTROCHANTERIC Left 11/29/2017   Procedure: INTRAMEDULLARY (IM) NAIL INTERTROCHANTRIC;  Surgeon: Myrene Galas, MD;  Location: [facility] OR;  Service: Orthopedics;  Laterality: Left;  . ORIF MANDIBULAR FRACTURE N/A 12/01/2017   Procedure: OPEN REDUCTION INTERNAL FIXATION (ORIF) MANDIBULAR FRACTURE;  Surgeon: Suzanna Obey, MD;  Location: [facility] OR;  Service: ENT;  Laterality: N/A;  . TRACHEOSTOMY TUBE PLACEMENT N/A 12/01/2017   Procedure: TRACHEOSTOMY;  Surgeon: Suzanna Obey, MD;  Location: [facility] OR;  Service: ENT;  Laterality: N/A;  . TYMPANOMASTOIDECTOMY Right 12/05/2017   Procedure: right canal up tympanomastoid, right mastoidectomy, exploration right facial nerve, Type 1 tympanoplasty, CSF leak repair.;  Surgeon: Ermalinda Barrios, MD;  Location: [facility] OR;  Service: ENT;  Laterality: Right;   Social History:  reports that he has never smoked. He has never used smokeless tobacco. His alcohol and drug histories are not on file.  Family / Support Systems Marital Status: Married How Long?: 3 yrs Patient Roles: Spouse, Parent Spouse/Significant Other: wife, Michelle [name] @ (C) [phone] Children: Pt and wife have, combined, 4 children with 3 living in the home ages 22, 3 and 2 yrs. Other Supports: Pt's paretns and 2 sisters are local as well as wife having several extended family members.  Her cousins are currently taking shifts assisting with child care. Anticipated Caregiver: wife Ability/Limitations of Caregiver: Wife works from home and can assist. Caregiver Availability: 24/7 Family Dynamics: Pt and wife report they have several family members who are  all very supportive and will assist with any care needed.  Good emotional support as well.  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 Legal Hisotry/Current Legal Issues: Worker's Comp case Guardian/Conservator: None - per MD, pt is not fully capable of making decisions on his own behalf.   Abuse/Neglect Abuse/Neglect Assessment Can Be Completed: Yes Physical Abuse: Denies Verbal Abuse: Denies Sexual Abuse: Denies Exploitation of patient/patient's resources: Denies Self-Neglect: Denies  Emotional Status Pt's affect, behavior adn adjustment status: Pt sitting up in bed and c/o much facial pain.  Attempts to answer basic questions but voice is weak and he is fatigued.  Wife answers most of the interview questions.  Wife notes that pt is experiencing "a ton of anxiety...he's worrying about everything and even focused on things in the future."  She is very concerned about this and we have agreed to begin with neuropsychology involvement on Monday. Recent Psychosocial Issues: None Pyschiatric History: none Substance Abuse History: None  Patient / Family Perceptions, Expectations & Goals Pt/Family understanding of illness & functional limitations: Pt has a general understanding of his injuries;  wife with better understanding and better awareness of care needs.   Premorbid pt/family roles/activities: Pt completely independent and working f/t. Anticipated changes in roles/activities/participation: Wife to provide primary caregiver support. Extended family to assist more with home management and child care. Pt/family expectations/goals: "I just want my face to stop hurting."    [employer]: None Premorbid Home Care/DME Agencies: None Transportation available at discharge: yes Resource referrals recommended: Support group (specify)  Discharge Planning Living Arrangements: Spouse/significant other, Children Support Systems: Spouse/significant other,  Children, Other relatives, Parent, Friends/neighbors, Case manager/social worker Case Manager/Social Worker Name: Worker's Comp CM = Amy Pearson @ (C) [phone] Case Manager/Social Worker Agency: [employer], research (medical) Type of Residence: Engineer, materials residence Civil engineer, contracting: Media planner (specify)(Worker's Comp) Surveyor, quantity Resources: Employment Surveyor, quantity Screen Referred: No Living Expenses: Database administrator Management: Patient, Spouse Does the patient have any problems obtaining your medications?: No Home Management: pt and wife shared responsibilities Patient/Family Preliminary Plans: Pt to d/c home with wife as primary caregiver but several familly members and friends available to assist as well. Expected length of stay: 12-18 days  Clinical Impression Very unfortunate gentleman here following an accident on the job and with multiple injuries.  This is a worker's comp claim as well.  Wife at bedside and very supportive and reports many local family members and friends are assisting with support of both pt/ wife as well as their 3 children in the home.  Pt able to answer basic questions, however, is clearly distracted by pain.  Wife reports pt is experiencing a lot of "anxiety" and desribes ruminations about concerns of family members and children.  Have referred for neuropsychology to begin with pt on Monday.  Will follow for continued support and d/c planning needs.  Aliani Caccavale 12/16/2017, 9:45 AM

## 2017-12-16 NOTE — Progress Notes (Signed)
Physical Therapy Session Note  Patient Details  Name: Willie Blevins MRN: [id number] Date of Birth: [birth year]  Today's Date: 12/16/2017 PT Individual Time: 0813-0909 PT Individual Time Calculation (min): 56 min   Short Term Goals: Week 1:  PT Short Term Goal 1 (Week 1): Pt will perform bed mobility with S PT Short Term Goal 2 (Week 1): Pt will perform transfers with consistent min guard PT Short Term Goal 3 (Week 1): Pt will perform sit <>stand consistent minA PT Short Term Goal 4 (Week 1): Pt will initiate gait training  Skilled Therapeutic Interventions/Progress Updates:    pt rec'd in bed, frustrated by having bowel issues all night but agreeable to therapy.  RN present and giving pain meds for Lt LE pain.  Pt performs dressing at bed level with mod A for lower body, min A upper body.  Supine to sit with min A for Lt LE.  Sit <> stand repetitions x 5 with focus on UE placement and standing tolerance. Pt then performs gait 10' x 2, 15' with RW and min A.  W/c mobility with supervision with bilat UEs.  UBE level 5, 2 min fwd/2 min bkwd.  Pt/wife and PT discussed car transfer, pt declines practice at this time but pt's wife given home measurement sheet to include car height so we can practice.  Pt left in room with needs at hand.  Therapy Documentation Precautions:  Precautions Precautions: Fall Precaution Comments: unlimited ROM BLE; ROM encouraged Restrictions Weight Bearing Restrictions: Yes RLE Weight Bearing: Weight bearing as tolerated LLE Weight Bearing: Weight bearing as tolerated Pain: Pain Assessment Pain Scale: 0-10 Pain Score: 8  Pain Orientation: Left Pain Radiating Towards: leg Patients Stated Pain Goal: 6 Pain Intervention(s):RN made aware, meds given during session    Therapy/Group: Individual Therapy  DONAWERTH,KAREN 12/16/2017, 9:11 AM

## 2017-12-16 NOTE — Progress Notes (Signed)
Physical Therapy Session Note  Patient Details  Name: Willie Blevins MRN: [id number] Date of Birth: Aug 20, 1975  Today's Date: 12/16/2017 PT Individual Time: 1405-1500 AND 1630-1700   57 and 42   Short Term Goals: Week 1:  PT Short Term Goal 1 (Week 1): Pt will perform bed mobility with S PT Short Term Goal 2 (Week 1): Pt will perform transfers with consistent min guard PT Short Term Goal 3 (Week 1): Pt will perform sit <>stand consistent minA PT Short Term Goal 4 (Week 1): Pt will initiate gait training  Skilled Therapeutic Interventions/Progress Updates:   Pt received supine in bed and agreeable to PT. Supine>sit transfer with min assist to control the LLE   PT applied R and L cam boots once Pt sitting EOB. Stand pivot transfer to [facility] with CGA and bed elevated.   WC mobility to rehab gym with supervision assist x 183ft. Min cues for turning in room from PT.   PT instructed pt in standing tolerance while engaged in fine motor/problem solving task to build multiple pipe tree puzzles progressing from low to moderate difficulty; pt able to stand for 4.5 min and 2.5 min with CGA-supervisoin assist with intermittent 1-0 UE support  Squat pivot transfer to nustep with CGA assist from PT. Nustep x 4 min with supervision assist and cues to remain in pain free ROM. Pt reports need for BM. PT transported pt to room in [facility]   Stand pivot transfer to [facility] with CGA and RW. Pt left sitting on BASC with wife present and RN aware of Pt position.      Session 2  Pt received supine in bed and agreeable to PT. Pt reports that he has been out of bed almost all day and would like to perform bed level PT.   PT instructed pt in bed level therex.  SAQ x 12  Hip abduction x 12 Ankle DF/PF x 20 Heel slides  X 10 Isometric hip abduction  X12 Quad sets x 12  AAROM on the L due to hip weakness and pain in femur. Cues from PT for technique, safety, and proper ROM  Pt left in bed with wife present.          Therapy Documentation Precautions:  Precautions Precautions: Fall Precaution Comments: unlimited ROM BLE; ROM encouraged Restrictions Weight Bearing Restrictions: Yes RLE Weight Bearing: Weight bearing as tolerated LLE Weight Bearing: Weight bearing as tolerated General:   Vital Signs: Therapy Vitals Temp: 97.8 F (36.6 C) Temp Source: Oral Pulse Rate: (!) 104 BP: 118/66 Patient Position (if appropriate): Sitting Oxygen Therapy SpO2: 97 % O2 Device: Room Air Pain: Pain Assessment Pain Scale: 0-10 Pain Score: 7  Pain Type: Acute pain Pain Location: Leg Pain Orientation: Left Pain Descriptors / Indicators: Aching Pain Onset: On-going Patients Stated Pain Goal: 6 Pain Intervention(s): Ambulation/increased activity;Emotional support Mobility:   Locomotion :    Trunk/Postural Assessment :    Balance: Standardized Balance Assessment Standardized Balance Assessment: Berg Balance Test Exercises:   Other Treatments:      Therapy/Group: Individual Therapy  Golden Pop 12/16/2017, 2:41 PM

## 2017-12-17 ENCOUNTER — Inpatient Hospital Stay (HOSPITAL_COMMUNITY): Payer: Self-pay | Admitting: Physical Therapy

## 2017-12-17 ENCOUNTER — Inpatient Hospital Stay (HOSPITAL_COMMUNITY): Payer: Self-pay

## 2017-12-17 ENCOUNTER — Inpatient Hospital Stay (HOSPITAL_COMMUNITY): Payer: Worker's Compensation | Admitting: Physical Therapy

## 2017-12-17 DIAGNOSIS — S0291XD Unspecified fracture of skull, subsequent encounter for fracture with routine healing: Secondary | ICD-10-CM

## 2017-12-17 DIAGNOSIS — S06330D Contusion and laceration of cerebrum, unspecified, without loss of consciousness, subsequent encounter: Secondary | ICD-10-CM

## 2017-12-17 MED ORDER — CHLORHEXIDINE GLUCONATE 0.12 % MT SOLN
15.0000 mL | Freq: Two times a day (BID) | OROMUCOSAL | Status: DC
  Administered 2017-12-17 – 2017-12-24 (×12): 15 mL via OROMUCOSAL
  Filled 2017-12-17 (×14): qty 15

## 2017-12-17 NOTE — Progress Notes (Signed)
Physical Therapy Session Note  Patient Details  Name: Willie Blevins MRN: [id number] Date of Birth: April 13, 1975  Today's Date: 12/17/2017 PT Individual Time: 1300-1410 PT Individual Time Calculation (min): 70 min   Short Term Goals: Week 1:  PT Short Term Goal 1 (Week 1): Pt will perform bed mobility with S PT Short Term Goal 2 (Week 1): Pt will perform transfers with consistent min guard PT Short Term Goal 3 (Week 1): Pt will perform sit <>stand consistent minA PT Short Term Goal 4 (Week 1): Pt will initiate gait training  Skilled Therapeutic Interventions/Progress Updates:   Pt received supine in bed and agreeable to PT. Supine>sit transfer with min assist and min cues to manage the LLE. Pt reports increased swelling in the LLE, which is also noted by this PT. PT instructed pt in donning cam boots Bil. Obtained ELR to aid in edema management while pt out of bed. Lateral scoot into WC with close supervision assist and min cues for UE placement. WC mobility instructed by PT x 136f, 1259fand 5053fMin cues for obstacle navigation with ELR in place. UBE, 3 min forward/3 min reverse with cues for proper speed at level 4.5>5. Patient returned to room and [facility] and all needs met.  Pt requesting to leave room with wife, RN ,mde aware and stated that he would inquire to MD concerning grounds pass with family.        Therapy Documentation Precautions:  Precautions Precautions: Fall Precaution Comments: unlimited ROM BLE; ROM encouraged Restrictions Weight Bearing Restrictions: Yes RLE Weight Bearing: Weight bearing as tolerated LLE Weight Bearing: Weight bearing as tolerated    Pain: Pain Assessment Pain Scale: 0-10 Pain Score: 8  Pain Type: Acute pain Pain Location: Leg Pain Orientation: Left Pain Descriptors / Indicators: Shooting Pain Onset: On-going Pain Intervention(s): Rest;Shower    Therapy/Group: Individual Therapy  AusLorie Phenix/03/2017, 6:01 PM

## 2017-12-17 NOTE — Progress Notes (Signed)
Physical Therapy Session Note  Patient Details  Name: Willie Blevins MRN: [id number] Date of Birth: 12-03-1975  Today's Date: 12/17/2017 PT Individual Time: 0915-1015 PT Individual Time Calculation (min): 60 min   Short Term Goals: Week 1:  PT Short Term Goal 1 (Week 1): Pt will perform bed mobility with S PT Short Term Goal 2 (Week 1): Pt will perform transfers with consistent min guard PT Short Term Goal 3 (Week 1): Pt will perform sit <>stand consistent minA PT Short Term Goal 4 (Week 1): Pt will initiate gait training  Skilled Therapeutic Interventions/Progress Updates:  Pt was seen bedside in the am. Pt transferred supine to edge of bed with head of bed elevated, side rail and min A with verbal cues. Donned pants and B cam walkers at edge of bed. Pt performed multiple sit to stand transfers from w/c and elevated surfaces with min A and verbal cues. Pt propelled w/c about 100 feet with B UEs and S. Pt ambulated 5 feet and 10 feet x 2 with rolling walker and min A with verbal cues. Pt returned to room. Pt transferred w/c to commode with rolling walker and min A with verbal cues. Pt dependent for hygiene. Pt returned to w/c with rolling walker and min A with verbal cues. Pt left sitting up in w/c with wife at bedside.   Therapy Documentation Precautions:  Precautions Precautions: Fall Precaution Comments: unlimited ROM BLE; ROM encouraged Restrictions Weight Bearing Restrictions: Yes RLE Weight Bearing: Weight bearing as tolerated LLE Weight Bearing: Weight bearing as tolerated General:   Vital Signs:   Pain: Pt c/o pin and needles in L foot.    Therapy/Group: Individual Therapy  Rayford Halsted 12/17/2017, 12:09 PM

## 2017-12-17 NOTE — Plan of Care (Signed)
  Problem: Consults Goal: RH GENERAL PATIENT EDUCATION Description See Patient Education module for education specifics. Outcome: Progressing   Problem: RH BLADDER ELIMINATION Goal: RH STG MANAGE BLADDER WITH ASSISTANCE Description STG Manage Bladder With  Min Assistance  Outcome: Progressing Goal: RH STG MANAGE BLADDER WITH MEDICATION WITH ASSISTANCE Description STG Manage Bladder With Medication  Mod I With Assistance.  Outcome: Progressing   Problem: RH SKIN INTEGRITY Goal: RH STG SKIN FREE OF INFECTION/BREAKDOWN Outcome: Progressing Goal: RH STG MAINTAIN SKIN INTEGRITY WITH ASSISTANCE Description STG Maintain Skin Integrity With min Assistance.  Outcome: Progressing Goal: RH STG ABLE TO PERFORM INCISION/WOUND CARE W/ASSISTANCE Description STG Able To Perform Incision/Wound Care With min Assistance.  Outcome: Progressing   Problem: RH SAFETY Goal: RH STG ADHERE TO SAFETY PRECAUTIONS W/ASSISTANCE/DEVICE Description STG Adhere to Safety Precautions With cues/reminders Assistance/Device.  Outcome: Progressing   Problem: RH PAIN MANAGEMENT Goal: RH STG PAIN MANAGED AT OR BELOW PT'S PAIN GOAL Description At or below level 6 with medications  Outcome: Progressing   Problem: RH KNOWLEDGE DEFICIT GENERAL Goal: RH STG INCREASE KNOWLEDGE OF SELF CARE AFTER HOSPITALIZATION Description Pt and wife will demo safe pt care and state readiness for discharge and manage self care using resources/handouts independently.  Outcome: Progressing

## 2017-12-17 NOTE — Progress Notes (Signed)
Patient ID: Willie Blevins, male   DOB: 1975/04/17, 42 y.o.   MRN: [id number]   Willie Blevins is a 42 y.o. male who is admitted for CIR with functional deficits secondary to TBI with polytrauma  Past Medical History:  Diagnosis Date  . Bullous emphysema (HCC)   . COPD (chronic obstructive pulmonary disease) (HCC)   . GERD (gastroesophageal reflux disease)   . Open right fibular fracture 12/02/2017  . Strabismus    with lazy eye (left)  . Traumatic iritis      Subjective: No new complaints. No new problems.  Pain reasonably well controlled  Objective: Vital signs in last 24 hours: Temp:  [97.8 F (36.6 C)-98.5 F (36.9 C)] 98 F (36.7 C) (11/02 0430) Pulse Rate:  [94-104] 94 (11/02 0430) Resp:  [18-19] 19 (11/02 0430) BP: (100-118)/(55-69) 112/69 (11/02 0430) SpO2:  [94 %-97 %] 94 % (11/02 0430) Weight:  [96.8 kg] 96.8 kg (11/02 0430) Weight change:  Last BM Date: 12/16/17  Intake/Output from previous day: 11/01 0701 - 11/02 0700 In: 920 [P.O.:720; NG/GT:200] Out: 850 [Urine:850]  Patient Vitals for the past 24 hrs:  BP Temp Temp src Pulse Resp SpO2 Weight  12/17/17 0430 112/69 98 F (36.7 C) Oral 94 19 94 % 96.8 kg  12/16/17 2010 (!) 100/55 98.5 F (36.9 C) - 100 18 95 % -  12/16/17 1415 118/66 97.8 F (36.6 C) Oral (!) 104 - 97 % -     Physical Exam General: No apparent distress   HEENT: not dry; jaw wired.  Right facial swelling with some mild ptosis and right facial weakness Lungs: Normal effort. Lungs clear to auscultation, no crackles or wheezes. Cardiovascular: Regular rate and rhythm, no edema Abdomen: S/NT/ND; BS(+) Musculoskeletal: Resolving soft tissue trauma and laceration of the lower extremities Neurological: No new neurological deficits Extremities.  Multiple resolving contusions lacerations; left leg more edematous Mental state: Alert, oriented, cooperative    Lab Results: BMET    Component Value Date/Time   NA 135 12/14/2017 0604   K 4.0 12/14/2017 0604   CL 101 12/14/2017 0604   CO2 25 12/14/2017 0604   GLUCOSE 126 (H) 12/14/2017 0604   BUN 21 (H) 12/14/2017 0604   CREATININE 0.76 12/14/2017 0604   CALCIUM 8.3 (L) 12/14/2017 0604   GFRNONAA >60 12/14/2017 0604   GFRAA >60 12/14/2017 0604   CBC    Component Value Date/Time   WBC 12.5 (H) 12/14/2017 0604   RBC 3.52 (L) 12/14/2017 0604   HGB 9.7 (L) 12/14/2017 0604   HCT 32.1 (L) 12/14/2017 0604   PLT 709 (H) 12/14/2017 0604   MCV 91.2 12/14/2017 0604   MCH 27.6 12/14/2017 0604   MCHC 30.2 12/14/2017 0604   RDW 14.4 12/14/2017 0604   LYMPHSABS 2.4 12/14/2017 0604   MONOABS 1.0 12/14/2017 0604   EOSABS 0.4 12/14/2017 0604   BASOSABS 0.0 12/14/2017 0604    Medications: I have reviewed the patient's current medications.  Assessment/Plan:  Functional deficits secondary to TBI and multitrauma.  Continue CIR DVT prophylaxis.  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

## 2017-12-17 NOTE — Progress Notes (Signed)
Occupational Therapy Session Note  Patient Details  Name: Willie Blevins MRN: [id number] Date of Birth: 1975-08-21  Today's Date: 12/17/2017 OT Individual Time: 1100-1202 OT Individual Time Calculation (min): 62 min    Short Term Goals: Week 1:  OT Short Term Goal 1 (Week 1): Pt will thread B LE through pants with min A OT Short Term Goal 2 (Week 1): Pt will complete stand pivot transfer to [facility] with min A OT Short Term Goal 3 (Week 1): Pt will complete grooming task EOB with improved cardiovascular endurance, as evidenced by no change in vital signs  OT Short Term Goal 4 (Week 1): Pt will don shirt with (S) sitting EOB  Skilled Therapeutic Interventions/Progress Updates:    Session focused on b/d tasks at shower level. Pt encouraged to try shower to alleviate pain, with wife encouraging as well. Pt agreed, first using BSC with min A provided during transfer and max A for peri hygiene. Pt transferred onto [facility] in shower with moderate cueing for positioning of UE/LE and min A overall. From seated level, pt's wife completed bathing for pt d/t high pain levels in L LE. Pt encouraged to breathe through pain and warm water applied directly to L leg. Pt reported pain did alleviate with shower. Pt donned shirt with set up following shower and pt opted to not don LB clothing. Pt returned to supine in bed and was left with RN present and all needs met.   Therapy Documentation Precautions:  Precautions Precautions: Fall Precaution Comments: unlimited ROM BLE; ROM encouraged Restrictions Weight Bearing Restrictions: Yes RLE Weight Bearing: Weight bearing as tolerated LLE Weight Bearing: Weight bearing as tolerated    Pain: Pain Assessment Pain Scale: 0-10 Pain Score: 8  Pain Type: Acute pain Pain Location: Leg Pain Orientation: Left Pain Descriptors / Indicators: Shooting Pain Onset: On-going Pain Intervention(s): Rest;Shower   Therapy/Group: Individual Therapy  Curtis Sites 12/17/2017, 3:23 PM
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
## 2017-12-18 NOTE — Progress Notes (Signed)
It was reported to this nurse that patient had complained of nausea last night and requested to have TF turned off.  He has not had TF since.  He has requested to not have continuous TF over next 14 hours.  He states (and it has been observed) that he is consuming 100+ of food presented to him.  He is refusing dietary food, but family is bringing in food for him to consume.  His fluid intake is more than adequate.

## 2017-12-19 ENCOUNTER — Inpatient Hospital Stay (HOSPITAL_COMMUNITY): Payer: Self-pay | Admitting: Occupational Therapy

## 2017-12-19 ENCOUNTER — Inpatient Hospital Stay (HOSPITAL_COMMUNITY): Payer: Self-pay | Admitting: Physical Therapy

## 2017-12-19 ENCOUNTER — Inpatient Hospital Stay (HOSPITAL_COMMUNITY): Payer: Self-pay

## 2017-12-19 ENCOUNTER — Inpatient Hospital Stay (HOSPITAL_COMMUNITY): Payer: Self-pay | Admitting: Speech Pathology

## 2017-12-19 LAB — CBC
HCT: 30.3 % — ABNORMAL LOW (ref 39.0–52.0)
Hemoglobin: 9 g/dL — ABNORMAL LOW (ref 13.0–17.0)
MCH: 26.8 pg (ref 26.0–34.0)
MCHC: 29.7 g/dL — ABNORMAL LOW (ref 30.0–36.0)
MCV: 90.2 fL (ref 80.0–100.0)
Platelets: 576 10*3/uL — ABNORMAL HIGH (ref 150–400)
RBC: 3.36 MIL/uL — AB (ref 4.22–5.81)
RDW: 14.8 % (ref 11.5–15.5)
WBC: 9.1 10*3/uL (ref 4.0–10.5)
nRBC: 0 % (ref 0.0–0.2)

## 2017-12-19 LAB — BASIC METABOLIC PANEL
ANION GAP: 4 — AB (ref 5–15)
BUN: 15 mg/dL (ref 6–20)
CALCIUM: 8.3 mg/dL — AB (ref 8.9–10.3)
CO2: 26 mmol/L (ref 22–32)
Chloride: 104 mmol/L (ref 98–111)
Creatinine, Ser: 0.73 mg/dL (ref 0.61–1.24)
GLUCOSE: 93 mg/dL (ref 70–99)
POTASSIUM: 4.6 mmol/L (ref 3.5–5.1)
Sodium: 134 mmol/L — ABNORMAL LOW (ref 135–145)

## 2017-12-19 LAB — CK: CK TOTAL: 51 U/L (ref 49–397)

## 2017-12-19 MED ORDER — SODIUM CHLORIDE 0.9 % IV SOLN
Freq: Once | INTRAVENOUS | Status: AC
  Administered 2017-12-19: 11:00:00 via INTRAVENOUS

## 2017-12-19 MED ORDER — PRO-STAT SUGAR FREE PO LIQD: 30.0000 mL | Freq: Two times a day (BID) | ORAL | Status: DC

## 2017-12-19 MED ORDER — OXYCODONE HCL 5 MG PO TABS
5.0000 mg | ORAL_TABLET | ORAL | Status: DC | PRN
  Administered 2017-12-19 – 2017-12-24 (×21): 10 mg via ORAL
  Filled 2017-12-19 (×22): qty 2

## 2017-12-19 MED ORDER — ACETAMINOPHEN 500 MG PO TABS
1000.0000 mg | ORAL_TABLET | Freq: Three times a day (TID) | ORAL | Status: DC
  Administered 2017-12-19 – 2017-12-24 (×15): 1000 mg via ORAL
  Filled 2017-12-19 (×15): qty 2

## 2017-12-19 MED ORDER — GABAPENTIN 600 MG PO TABS
600.0000 mg | ORAL_TABLET | Freq: Three times a day (TID) | ORAL | Status: DC
  Administered 2017-12-19 – 2017-12-24 (×14): 600 mg via ORAL
  Filled 2017-12-19 (×14): qty 1

## 2017-12-19 NOTE — Progress Notes (Signed)
[place] PHYSICAL MEDICINE & REHABILITATION PROGRESS NOTE   Subjective/Complaints: Still with numerous complaints including pain, diet. Wants to take of bands for chewing of food as well as for pills. Surgeon had recommended 2 weeks with bands.   ROS: Patient denies fever, rash, sore throat, blurred vision, nausea, vomiting, diarrhea, cough, shortness of breath or chest pain, joint or back pain, headache, or mood change.   Objective:   No results found. Recent Labs    12/19/17 0604  WBC 9.1  HGB 9.0*  HCT 30.3*  PLT 576*   Recent Labs    12/19/17 0604  NA 134*  K 4.6  CL 104  CO2 26  GLUCOSE 93  BUN 15  CREATININE 0.73  CALCIUM 8.3*    Intake/Output Summary (Last 24 hours) at 12/19/2017 1310 Last data filed at 12/19/2017 0950 Gross per 24 hour  Intake 1239.62 ml  Output -  Net 1239.62 ml     Physical Exam: Vital Signs Blood pressure 107/60, pulse 87, temperature (!) 97.5 F (36.4 C), temperature source Axillary, resp. rate 19, height 6\' 2"  (1.88 m), weight 100 kg, SpO2 92 %. Constitutional: No distress . Vital signs reviewed. HEENT: EOMI, oral membranes moist Neck: supple Cardiovascular: RRR without murmur. No JVD    Respiratory: CTA Bilaterally without wheezes or rales. Normal effort    GI: BS +, non-tender, non-distended  Musculoskeletal: 1+ edema left thigh down to 1-2+ left foot. Multiple sutures LLE with scabbed abrasions on left lateral shin. Right calf lac with sutures, and surrounding eschar. . LLE with areas of eschar, some s/s drainage.  Left chest wall/back remain  tender. Wounds stable Neurological: He isalertand oriented to person, place, and time. Right facial weakness, decr lid closure. Marland Kitchen Speech surred.  Lifts RLE easily against gravity. wiggles toes on left. LLE limited. Normal sensation Skin: warm, as above Psychiatric: cooperative, anxious    Assessment/Plan: 1. Functional deficits secondary to TBI/polytrauma which require 3+ hours  per day of interdisciplinary therapy in a comprehensive inpatient rehab setting.  Physiatrist is providing close team supervision and 24 hour management of active medical problems listed below.  Physiatrist and rehab team continue to assess barriers to discharge/monitor patient progress toward functional and medical goals  Care Tool:  Bathing    Body parts bathed by patient: Right arm, Left arm, Chest, Abdomen, Front perineal area, Right upper leg, Left upper leg, Face, Buttocks   Body parts bathed by helper: Right lower leg, Left lower leg     Bathing assist Assist Level: Minimal Assistance - Patient > 75%     Upper Body Dressing/Undressing Upper body dressing   What is the patient wearing?: Pull over shirt    Upper body assist Assist Level: Contact Guard/Touching assist    Lower Body Dressing/Undressing Lower body dressing      What is the patient wearing?: Pants     Lower body assist Assist for lower body dressing: Minimal Assistance - Patient > 75%     Toileting Toileting    Toileting assist Assist for toileting: Minimal Assistance - Patient > 75%(wife assist with BSC )     Transfers Chair/bed transfer  Transfers assist  Chair/bed transfer activity did not occur: Safety/medical concerns  Chair/bed transfer assist level: Supervision/Verbal cueing     Locomotion Ambulation   Ambulation assist   Ambulation activity did not occur: Safety/medical concerns  Assist level: Minimal Assistance - Patient > 75% Assistive device: Walker-rolling Max distance: 20   Walk 10 feet activity  Assist  Walk 10 feet activity did not occur: Safety/medical concerns  Assist level: Minimal Assistance - Patient > 75% Assistive device: Walker-rolling   Walk 50 feet activity   Assist Walk 50 feet with 2 turns activity did not occur: Safety/medical concerns         Walk 150 feet activity   Assist Walk 150 feet activity did not occur: Safety/medical concerns          Walk 10 feet on uneven surface  activity   Assist Walk 10 feet on uneven surfaces activity did not occur: Safety/medical concerns         Wheelchair     Assist Will patient use wheelchair at discharge?: Yes Type of Wheelchair: Manual    Wheelchair assist level: Supervision/Verbal cueing Max wheelchair distance: 150    Wheelchair 50 feet with 2 turns activity    Assist        Assist Level: Supervision/Verbal cueing   Wheelchair 150 feet activity     Assist Wheelchair 150 feet activity did not occur: Safety/medical concerns(fatigue)   Assist Level: Supervision/Verbal cueing  Medical Problem List and Plan: 1.Functional deficitssecondary to TBI/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 DVT Prophylaxis/Anticoagulation: Pharmaceutical:Lovenox 3. Pain Management:Continue scheduled tylenol and robaxin. Oxycodone prn for severe pain.  - kpad, lidoderm patches for chest wall discomfort  -better control  -increased gabapentin to 600mg  tid 4. Mood:LCSW to follow for evaluation and support. 5. Neuropsych: This patientis not fullycapable of making decisions on hisown behalf. 6. Skin/Wound Care:Monitor wounds for healing. Continue nutritional supplements to promote healing. 7. Fluids/Electrolytes/Nutrition:Monitor I/O. Calorie ct/ TF   -protein deficient----increase protein intake  -with wires, trach out---D1 thins. Will check with ENT re: removal of rubber bands or decrease of RB's. Having troubles chewing with RB's in place. Doesn't want pills crushed.   -I personally reviewed the patient's labs today.   8.Acute hypoxic respiratory failure:Tolerating T collar.Downsized to CFS#6 on 10/28 -trach removed 10/31 by ENT, compressive dressing to stoma. 9. 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 ENT saw today 11.Bilateral LLE crush injuries with left septic femur fracture and right open fibula fracture with extensive muscle injury: PWB on RLE and WBAT LLE.  12.Right facial and mandible fracture status post ORIF mandible 10/17:  13.Acute stress disorder with anxiety/PTSD:   -Started on prazosin for nightmares. bp holding  -Psychiatry recommends adding Lexapro if prazosin or Atarax ineffective   14. Aspiration PNA: Leucocytosis resolving. Continue Rocephin added 10/20 for 22 day course.   -wbc's falling steadily---recheck next week 15. ABLA: Continue to monitor for recovery. 9.7 on 10/30, 9.0 11/4 16. Rhabdomyolysis: CK improving 1815-->4575--> 1,963.added water flushes.  -continue to push fluids  -follow up ck/bmet next week  17. Abnormal LFTs: returning to near normal range (10/30)       LOS: 6 days A FACE TO FACE EVALUATION WAS PERFORMED  Ranelle Oyster 12/19/2017, 1:10 PM

## 2017-12-19 NOTE — Progress Notes (Signed)
Occupational Therapy Session Note  Patient Details  Name: Willie Blevins MRN: [id number] Date of Birth: 1975/09/04  Today's Date: 12/19/2017 OT Individual Time: 0800-0900 OT Individual Time Calculation (min): 60 min    Short Term Goals: Week 1:  OT Short Term Goal 1 (Week 1): Pt will thread B LE through pants with min A OT Short Term Goal 2 (Week 1): Pt will complete stand pivot transfer to [facility] with min A OT Short Term Goal 3 (Week 1): Pt will complete grooming task EOB with improved cardiovascular endurance, as evidenced by no change in vital signs  OT Short Term Goal 4 (Week 1): Pt will don shirt with (S) sitting EOB  Skilled Therapeutic Interventions/Progress Updates:    Session focused on b/d tasks at sink level. Upon entering room pt emotional re condition and d/c. Emotional support and condition insight and review of progress in OT POC provided. Pt transitioned to EOB with min A for LLE support. Pt completed lateral scoot to w/c with (S) and support on w/c. RN entered room to perform dressing change on BLE. Pt completed UB bathing/dressing with set up. Edu provided for importance of pt completing as much of ADLs as possible d/t heavy dependence on wife assisting. Pt stood x2 and with CGA using RW. Vc provided for body mechanics to ease transfer. CGA provided during standing level clothing management. Sitting in w/c, cueing provided for dressing technique and body mechanics. Pt able to don pants with light min A overall. Pt left sitting up in w/c with wife present donning cam boot. Pain during session as described below.   Therapy Documentation Precautions:  Precautions Precautions: Fall Precaution Comments: unlimited ROM BLE; ROM encouraged Restrictions Weight Bearing Restrictions: Yes RLE Weight Bearing: Weight bearing as tolerated LLE Weight Bearing: Weight bearing as tolerated   Vital Signs: Therapy Vitals Temp: (!) 97.5 F (36.4 C) Temp Source: Axillary Pulse Rate:  87 Resp: 19 BP: 107/60 Patient Position (if appropriate): Lying Oxygen Therapy SpO2: 92 % O2 Device: Room Air Pain: Pain Assessment Pain Scale: 0-10 Pain Score: 8  Pain Type: Acute pain Pain Location: Leg Pain Orientation: Left Pain Descriptors / Indicators: Shooting Pain Onset: On-going Pain Intervention(s): Repositioned;Ambulation/increased activity   Therapy/Group: Individual Therapy  Crissie Reese 12/19/2017, 9:18 AM

## 2017-12-19 NOTE — Progress Notes (Signed)
Patient refused to have feeding restarted this shif

## 2017-12-19 NOTE — Progress Notes (Signed)
Physical Therapy Session Note  Patient Details  Name: Willie Blevins [name] MRN: [id number] Date of Birth: 08/13/1975  Today's Date: 12/19/2017 PT Individual Time: 1100-1150 PT Individual Time Calculation (min): 50 min   Short Term Goals: Week 1:  PT Short Term Goal 1 (Week 1): Pt will perform bed mobility with S PT Short Term Goal 2 (Week 1): Pt will perform transfers with consistent min guard PT Short Term Goal 3 (Week 1): Pt will perform sit <>stand consistent minA PT Short Term Goal 4 (Week 1): Pt will initiate gait training  Skilled Therapeutic Interventions/Progress Updates:    pt initially drowsy, but agreeable to therapy with encouragement.  PT spent time educating wife on recommendations for home equipment, safety and follow up.  Pt performs bed mobility with supervision.  Total A to don cam boots. Gait with RW x 20' with CGA.  Stair negotiation x 2 6'' stairs with bilat handrails with mod A. Pt relies heavily on UEs and reports much increased pain with stairs.  Social work aware of recommendation for a ramp at home. Pt performs squat pivot transfer to low couch to simulate home set up.  Pt able to perform with supervision. Pt left with wife with needs at hand.  Therapy Documentation Precautions:  Precautions Precautions: Fall Precaution Comments: unlimited ROM BLE; ROM encouraged Restrictions Weight Bearing Restrictions: Yes RLE Weight Bearing: Weight bearing as tolerated LLE Weight Bearing: Weight bearing as tolerated General: PT Amount of Missed Time (min): 10 Minutes PT Missed Treatment Reason: Patient fatigue Pain: Pt c/o Lt LE pain throughout session, rest and repositioned as needed, meds given prior to session   Therapy/Group: Individual Therapy  Latessa Tillis 12/19/2017, 11:55 AM
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
## 2017-12-19 NOTE — Progress Notes (Signed)
Occupational Therapy Session Note  Patient Details  Name: Abdulahad R [name] MRN: [id number] Date of Birth: 1975-11-11  Today's Date: 12/19/2017 OT Individual Time: 4098-1191 OT Individual Time Calculation (min): 33 min    Short Term Goals: Week 1:  OT Short Term Goal 1 (Week 1): Pt will thread B LE through pants with min A OT Short Term Goal 2 (Week 1): Pt will complete stand pivot transfer to [facility] with min A OT Short Term Goal 3 (Week 1): Pt will complete grooming task EOB with improved cardiovascular endurance, as evidenced by no change in vital signs  OT Short Term Goal 4 (Week 1): Pt will don shirt with (S) sitting EOB  Skilled Therapeutic Interventions/Progress Updates:    Patient seen bedside for 30 minute session due to fatigue.  Reviewed DME (commode and tub bench) with patient and wife, reviewed positioning and therapeutic activity that they can complete together.  Patient able to tolerated bilateral UE activity - note that patient with tenderness around clavicle/AC with OH reach.  Completed trunk mobility activity with min pain noted at left lower rib cage.  Patient instructed to monitor pain and activity tolerance.   Reviewed safety with holding younger children and strategies to include them in exercise upon return home.  Patient repositioned and resting in bed at close of session with wife present.  Therapy Documentation Precautions:  Precautions Precautions: Fall Precaution Comments: unlimited ROM BLE; ROM encouraged Restrictions Weight Bearing Restrictions: Yes RLE Weight Bearing: Weight bearing as tolerated LLE Weight Bearing: Weight bearing as tolerated General: General PT Missed Treatment Reason: Patient fatigue Vital Signs: Therapy Vitals Temp: 98.1 F (36.7 C) Pulse Rate: (!) 106 Resp: 18 BP: 110/62 Patient Position (if appropriate): Lying Oxygen Therapy SpO2: 96 % O2 Device: Room Air Pain: Pain Assessment Pain Scale: 0-10 Pain Score: 2  Pain Type: Acute  pain Pain Location: Shoulder Pain Orientation: Right Pain Descriptors / Indicators: Aching Pain Onset: With Activity Pain Intervention(s): Repositioned   Therapy/Group: Individual Therapy  Barrie Lyme 12/19/2017, 3:11 PM

## 2017-12-19 NOTE — Progress Notes (Signed)
Speech Language Pathology Daily Session Note  Patient Details  Name: Willie Blevins MRN: [id number] Date of Birth: 1975-05-10  Today's Date: 12/19/2017 SLP Individual Time: 1240-1320 SLP Individual Time Calculation (min): 40 min  Short Term Goals: Week 1: SLP Short Term Goal 1 (Week 1): Patient will consume thin liquids with minimal overt s/s of aspiration and Mod I for use of swallowing compensatory strategies.  SLP Short Term Goal 2 (Week 1): Patient will utilize speech intelligibility strategies at the sentence level with Mod I to achieve 100% intelligibility.  SLP Short Term Goal 3 (Week 1): Patient will recall new, daily information with Mod I.  SLP Short Term Goal 4 (Week 1): Patinet will dived attention between 2 tasks in a moderatly distracting enviornment for 30 minutes with Mod I.   Skilled Therapeutic Interventions: Skilled treatment session focused on dysphagia goals. SLP facilitated session by providing skilled observation with trials of Dys. 2 textures. Patient demonstrated prolonged but efficient mastication of textures without overt s/s of aspiration. Therefore, recommend patient upgrade to Dys. 2 textures. Educated patient and his wife on a variety of utensils to try to help ease of self-feeding while rubber bands in place. Both verbalized understanding and agreement. Of note, patient consumed medications 1 at a time, cut in half with thin liquids without overt s/s of aspiration. Patient left upright in bed with wife present. Continue with current plan of care.      Pain Pain Assessment Pain Scale: 0-10 Pain Score: 9  Pain Type: Acute pain Pain Location: Leg Pain Orientation: Left Pain Descriptors / Indicators: Shooting;Sharp Pain Frequency: Constant Pain Onset: On-going Patients Stated Pain Goal: 4 Pain Intervention(s): Medication (See eMAR)  Therapy/Group: Individual Therapy  Willie Blevins 12/19/2017, 1:25 PM

## 2017-12-19 NOTE — Progress Notes (Signed)
Discontinued Cortrac tube per Marissa Nestle, PA-C. Patient tolerated well. Patient resting comfortably in bed with spouse at the bedside.

## 2017-12-20 ENCOUNTER — Inpatient Hospital Stay (HOSPITAL_COMMUNITY): Payer: Self-pay

## 2017-12-20 ENCOUNTER — Inpatient Hospital Stay (HOSPITAL_COMMUNITY): Payer: Self-pay | Admitting: Speech Pathology

## 2017-12-20 ENCOUNTER — Inpatient Hospital Stay (HOSPITAL_COMMUNITY): Payer: Self-pay | Admitting: Physical Therapy

## 2017-12-20 NOTE — Progress Notes (Signed)
Physical Therapy Weekly Progress Note  Patient Details  Name: Willie Blevins MRN: [id number] Date of Birth: 1976/02/10  Beginning of progress report period: December 14, 2017 End of progress report period: December 20, 2017   Patient has met 3 of 4 short term goals.  Pt continues to be limited by pain but is making progress with gait and transfers.  Pt is supervision with bed mobility, min A for short distance gait.  Plan to attempt real car transfer this coming week.  Patient continues to demonstrate the following deficits muscle weakness and decreased standing balance, decreased balance strategies and difficulty maintaining precautions and therefore will continue to benefit from skilled PT intervention to increase functional independence with mobility.  Patient progressing toward long term goals..  Continue plan of care.  PT Short Term Goals Week 1:  PT Short Term Goal 1 (Week 1): Pt will perform bed mobility with S PT Short Term Goal 1 - Progress (Week 1): Progressing toward goal PT Short Term Goal 2 (Week 1): Pt will perform transfers with consistent min guard PT Short Term Goal 2 - Progress (Week 1): Met PT Short Term Goal 3 (Week 1): Pt will perform sit <>stand consistent minA PT Short Term Goal 3 - Progress (Week 1): Met PT Short Term Goal 4 (Week 1): Pt will initiate gait training PT Short Term Goal 4 - Progress (Week 1): Met Week 2:  PT Short Term Goal 1 (Week 2): = LTG  Skilled Therapeutic Interventions/Progress Updates:  Ambulation/gait training;Discharge planning;Psychosocial support;Functional mobility training;Therapeutic Activities;Wheelchair propulsion/positioning;Therapeutic Exercise;Skin care/wound management;Disease management/prevention;Neuromuscular re-education;Balance/vestibular training;Cognitive remediation/compensation;DME/adaptive equipment instruction;Pain management;Splinting/orthotics;UE/LE Strength taining/ROM;UE/LE Coordination activities;Patient/family  education;Functional electrical stimulation;Community reintegration;Stair training     Devanta Daniel 12/20/2017, 8:29 AM

## 2017-12-20 NOTE — Progress Notes (Signed)
Speech Language Pathology Daily Session Note  Patient Details  Name: Kharter R [name] MRN: [id number] Date of Birth: [birth year]  Today's Date: 12/20/2017 SLP Individual Time: 1130-1200 SLP Individual Time Calculation (min): 30 min  Short Term Goals: Week 1: SLP Short Term Goal 1 (Week 1): Patient will consume thin liquids with minimal overt s/s of aspiration and Mod I for use of swallowing compensatory strategies.  SLP Short Term Goal 2 (Week 1): Patient will utilize speech intelligibility strategies at the sentence level with Mod I to achieve 100% intelligibility.  SLP Short Term Goal 3 (Week 1): Patient will recall new, daily information with Mod I.  SLP Short Term Goal 4 (Week 1): Patinet will dived attention between 2 tasks in a moderatly distracting enviornment for 30 minutes with Mod I.   Skilled Therapeutic Interventions: Skilled treatment session focused on dysphagia and speech goals. SLP facilitated session by providing skilled observation with lunch meal of Dys. 2 textures with thin liquids. Patient demonstrated efficient mastication and consumed meals without overt s/s of aspiration. However, patient required supervision verbal cues to self-monitor and correct anterior spillage and to minimize opening of oral cavity with self-feeding (after rubber band broke). Recommend patient continue current diet. Patient was also Mod I for applying pressure to stoma during speech to achieve 100% intelligibility at the conversation level. Patient left upright in bed with all needs within reach. Continue with current plan of care.      Pain No/reports of pain   Therapy/Group: Individual Therapy  Chakita Mcgraw 12/20/2017, 2:35 PM

## 2017-12-20 NOTE — Progress Notes (Signed)
Recreational Therapy Session Note  Patient Details  Name: Willie Blevins MRN: [id number] Date of Birth: November 07, 1975 Today's Date: 12/20/2017   TR eval deferred as pt is expected to discharge home this week per team.  Will continue to monitor. [name] 12/20/2017, 4:07 PM

## 2017-12-20 NOTE — Progress Notes (Signed)
Occupational Therapy Session Note  Patient Details  Name: Willie Blevins MRN: [id number] Date of Birth: Sep 11, 1975  Today's Date: 12/20/2017 OT Individual Time: Session 1: 830-940 Session 2: 1400-1430 OT Individual Time Calculation (min): Session 1: 70 min Session 2:30 min    Short Term Goals: Week 1:  OT Short Term Goal 1 (Week 1): Pt will thread B LE through pants with min A OT Short Term Goal 2 (Week 1): Pt will complete stand pivot transfer to [name] with min A OT Short Term Goal 3 (Week 1): Pt will complete grooming task EOB with improved cardiovascular endurance, as evidenced by no change in vital signs  OT Short Term Goal 4 (Week 1): Pt will don shirt with (S) sitting EOB  Skilled Therapeutic Interventions/Progress Updates:    Session 1: Session focused on b/d at shower level. Pt transferred into walk in shower on BSC with min A. Min cueing provided for UE placement on grab bars and positioning. Pt able to complete all bathing seated, except bottom d/t fear of releasing one hand from grab bar. Pt transferred out of shower into w/c with (S). Pt thread B LE through pants, with cueing for technique, with (S) only. Pt left sitting up in w/c with RN and wife present, all needs met.   Session 2: Session focused on tub transfer and use of DME. Extensive edu provided to pt and wife re types of tub benches/seats. Pt completed tub transfer using TTB with min A for lifting L leg over tub. Discussed home modifications with pt and wife, including widening of door ways and installation of grab bars. Pt returned to room and transferred back to bed with all needs met.   Therapy Documentation Precautions:  Precautions Precautions: Fall Precaution Comments: unlimited ROM BLE; ROM encouraged Restrictions Weight Bearing Restrictions: Yes RLE Weight Bearing: Weight bearing as tolerated LLE Weight Bearing: Partial weight bearing Vital Signs: Therapy Vitals Pulse Rate: (!) 115 Resp: 18 BP:  107/70 Patient Position (if appropriate): Sitting Oxygen Therapy SpO2: 97 % O2 Device: Room Air Pain: Pain Assessment Pain Scale: 0-10 Pain Score: 7  Pain Type: Acute pain Pain Location: Leg Pain Orientation: Left Pain Descriptors / Indicators: Aching Pain Onset: On-going Pain Intervention(s): Emotional support   Therapy/Group: Individual Therapy  Curtis Sites 12/20/2017, 3:23 PM

## 2017-12-20 NOTE — Progress Notes (Signed)
[place] PHYSICAL MEDICINE & REHABILITATION PROGRESS NOTE   Subjective/Complaints: Doing much better today. Happy that NG is out. Eating breakfast this morning. Still having generalized pain, esp LLE  ROS: Patient denies fever, rash, sore throat, blurred vision, nausea, vomiting, diarrhea, cough, shortness of breath or chest pain,  headache, or mood change.   Objective:   No results found. Recent Labs    12/19/17 0604  WBC 9.1  HGB 9.0*  HCT 30.3*  PLT 576*   Recent Labs    12/19/17 0604  NA 134*  K 4.6  CL 104  CO2 26  GLUCOSE 93  BUN 15  CREATININE 0.73  CALCIUM 8.3*    Intake/Output Summary (Last 24 hours) at 12/20/2017 0913 Last data filed at 12/19/2017 1700 Gross per 24 hour  Intake 820.57 ml  Output -  Net 820.57 ml     Physical Exam: Vital Signs Blood pressure 111/65, pulse 94, temperature 98.1 F (36.7 C), temperature source Oral, resp. rate 18, height 6\' 2"  (1.88 m), weight 100 kg, SpO2 91 %. Constitutional: No distress . Vital signs reviewed. HEENT: EOMI, oral membranes moist Neck: supple, trach stoma still leaking air.  Cardiovascular: RRR without murmur. No JVD    Respiratory: CTA Bilaterally without wheezes or rales. Normal effort    GI: BS +, non-tender, non-distended  Musculoskeletal: 1+ edema left thigh down to 1-2+ left foot. Multiple sutures LLE with scabbed abrasions on left lateral shin. Right calf lac with sutures, and surrounding eschar. . LLE with areas of eschar, minimal drainage.  Left chest wall/back still  tender.  Right facial wounds appear clean Neurological: He isalertand oriented to person, place, and time. Right facial weakness, decr lid closure. Marland Kitchen Speech surred.  Lifts RLE easily against gravity. wiggles toes on left. LLE limited. Normal sensation Skin: warm, as above Psychiatric: cooperative, in brighter spirits today    Assessment/Plan: 1. Functional deficits secondary to TBI/polytrauma which require 3+ hours per day  of interdisciplinary therapy in a comprehensive inpatient rehab setting.  Physiatrist is providing close team supervision and 24 hour management of active medical problems listed below.  Physiatrist and rehab team continue to assess barriers to discharge/monitor patient progress toward functional and medical goals  Care Tool:  Bathing    Body parts bathed by patient: Right arm, Left arm, Chest, Abdomen, Front perineal area, Right upper leg, Left upper leg, Face, Buttocks   Body parts bathed by helper: Right lower leg, Left lower leg     Bathing assist Assist Level: Minimal Assistance - Patient > 75%     Upper Body Dressing/Undressing Upper body dressing   What is the patient wearing?: Pull over shirt    Upper body assist Assist Level: Contact Guard/Touching assist    Lower Body Dressing/Undressing Lower body dressing      What is the patient wearing?: Pants     Lower body assist Assist for lower body dressing: Minimal Assistance - Patient > 75%     Toileting Toileting    Toileting assist Assist for toileting: Minimal Assistance - Patient > 75%     Transfers Chair/bed transfer  Transfers assist  Chair/bed transfer activity did not occur: Safety/medical concerns  Chair/bed transfer assist level: Supervision/Verbal cueing     Locomotion Ambulation   Ambulation assist   Ambulation activity did not occur: Safety/medical concerns  Assist level: Minimal Assistance - Patient > 75% Assistive device: Walker-rolling Max distance: 20   Walk 10 feet activity   Assist  Walk 10 feet  activity did not occur: Safety/medical concerns  Assist level: Minimal Assistance - Patient > 75% Assistive device: Walker-rolling   Walk 50 feet activity   Assist Walk 50 feet with 2 turns activity did not occur: Safety/medical concerns         Walk 150 feet activity   Assist Walk 150 feet activity did not occur: Safety/medical concerns         Walk 10 feet on  uneven surface  activity   Assist Walk 10 feet on uneven surfaces activity did not occur: Safety/medical concerns         Wheelchair     Assist Will patient use wheelchair at discharge?: Yes Type of Wheelchair: Manual    Wheelchair assist level: Supervision/Verbal cueing Max wheelchair distance: 150    Wheelchair 50 feet with 2 turns activity    Assist        Assist Level: Supervision/Verbal cueing   Wheelchair 150 feet activity     Assist Wheelchair 150 feet activity did not occur: Safety/medical concerns(fatigue)   Assist Level: Supervision/Verbal cueing  Medical Problem List and Plan: 1.Functional deficitssecondary to TBI/facial trauma 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 DVT Prophylaxis/Anticoagulation: Pharmaceutical:Lovenox 3. Pain Management:Continue scheduled tylenol and robaxin. Oxycodone prn for severe pain.  - kpad, lidoderm patches for chest wall discomfort  -better control  -increased gabapentin to 600mg  tid 4. Mood:LCSW to follow for evaluation and support. 5. Neuropsych: This patientis not fullycapable of making decisions on hisown behalf. 6. Skin/Wound Care:Monitor wounds for healing. Continue nutritional supplements to promote healing. 7. Fluids/Electrolytes/Nutrition:Monitor I/O. Calorie ct/ TF   -protein deficient----increase protein intake  -diet advanced to D2 thins,   -NGT out 8.Acute hypoxic respiratory failure:Tolerating T collar.Downsized to CFS#6 on 10/28 -trach removed 10/31 by ENT, compressive dressing to stoma. Reminded patient that he needs to apply pressure to stoma when he talks or valsalvas until its sealed.  9. 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 ENT following 11.Bilateral LLE crush injuries with left septic femur fracture and right open fibula fracture with extensive muscle injury: PWB on RLE and WBAT LLE.  12.Right facial and mandible fracture status post ORIF mandible 10/17:  13.Acute stress disorder with anxiety/PTSD:   -Started on prazosin for nightmares. bp holding  -Psychiatry recommends adding Lexapro if prazosin or Atarax ineffective   14. Aspiration PNA: Leucocytosis resolving. Continue Rocephin added 10/20 for 22 day course.   -wbc's falling steadily--9.1 on 11/4 15. ABLA: Continue to monitor for recovery. 9.7 on 10/30, 9.0 11/4 16. Rhabdomyolysis: CK improving 1815-->4575--> 1,963.added water flushes.  -continue to push fluids   17. Abnormal LFTs: returning to near normal range (10/30)       LOS: 7 days A FACE TO FACE EVALUATION WAS PERFORMED  Ranelle Oyster 12/20/2017, 9:13 AM

## 2017-12-20 NOTE — Progress Notes (Signed)
Physical Therapy Session Note  Patient Details  Name: Willie Blevins MRN: [id number] Date of Birth: [birth year]  Today's Date: 12/20/2017 PT Individual Time: 1300-1355 PT Individual Time Calculation (min): 55 min   Short Term Goals: Week 2:  PT Short Term Goal 1 (Week 2): = LTG  Skilled Therapeutic Interventions/Progress Updates:    session focused on real car transfer to pt's SUV.  Pt propels w/c in controlled environment 200' with supervision.  In outdoor environment pt propels w/c 75' x 2 with obstacle negotiation and while carrying items with supervision.  Pt performs stand pivot transfer with RW to car x 2 attempts initially with PT providing min A, then with pt's wife providing min A.  Pt and wife feel safe and capable of performing car transfers at home at this level.  Pt left in room with needs at hand.  Therapy Documentation Precautions:  Precautions Precautions: Fall Precaution Comments: unlimited ROM BLE; ROM encouraged Restrictions Weight Bearing Restrictions: Yes RLE Weight Bearing: Weight bearing as tolerated LLE Weight Bearing: Partial weight bearing Pain:  pt c/o Lt LE pain with mobility, eases with rest      Therapy/Group: Individual Therapy  Millard Bautch 12/20/2017, 1:56 PM

## 2017-12-21 ENCOUNTER — Inpatient Hospital Stay (HOSPITAL_COMMUNITY): Payer: Self-pay

## 2017-12-21 ENCOUNTER — Inpatient Hospital Stay (HOSPITAL_COMMUNITY): Payer: Self-pay | Admitting: Physical Therapy

## 2017-12-21 ENCOUNTER — Inpatient Hospital Stay (HOSPITAL_COMMUNITY): Payer: Self-pay | Admitting: Speech Pathology

## 2017-12-21 ENCOUNTER — Inpatient Hospital Stay (HOSPITAL_COMMUNITY): Payer: Self-pay | Admitting: Occupational Therapy

## 2017-12-21 MED ORDER — VITAMIN C 500 MG PO TABS
1000.0000 mg | ORAL_TABLET | Freq: Every day | ORAL | Status: DC
  Administered 2017-12-22 – 2017-12-24 (×3): 1000 mg via ORAL
  Filled 2017-12-21 (×3): qty 2

## 2017-12-21 MED ORDER — FAMOTIDINE 20 MG PO TABS
20.0000 mg | ORAL_TABLET | Freq: Two times a day (BID) | ORAL | Status: DC
  Administered 2017-12-22 – 2017-12-24 (×5): 20 mg via ORAL
  Filled 2017-12-21 (×5): qty 1

## 2017-12-21 NOTE — Plan of Care (Signed)
  Problem: RH SAFETY Goal: RH STG ADHERE TO SAFETY PRECAUTIONS W/ASSISTANCE/DEVICE Description STG Adhere to Safety Precautions With cues/reminders Assistance/Device.  Outcome: Progressing  Call light within reach, bed/chair alarm,

## 2017-12-21 NOTE — Progress Notes (Signed)
[place] PHYSICAL MEDICINE & REHABILITATION PROGRESS NOTE   Subjective/Complaints: Woke up and left leg/foot throbbing. Foot is numb (not new). Eating well.   ROS: Patient denies fever, rash, sore throat, blurred vision, nausea, vomiting, diarrhea, cough, shortness of breath or chest pain,  headache, or mood change.    Objective:   No results found. Recent Labs    12/19/17 0604  WBC 9.1  HGB 9.0*  HCT 30.3*  PLT 576*   Recent Labs    12/19/17 0604  NA 134*  K 4.6  CL 104  CO2 26  GLUCOSE 93  BUN 15  CREATININE 0.73  CALCIUM 8.3*    Intake/Output Summary (Last 24 hours) at 12/21/2017 0900 Last data filed at 12/21/2017 0757 Gross per 24 hour  Intake 980 ml  Output 250 ml  Net 730 ml     Physical Exam: Vital Signs Blood pressure 123/65, pulse 99, temperature 97.9 F (36.6 C), temperature source Oral, resp. rate 18, height 6\' 2"  (1.88 m), weight 98 kg, SpO2 93 %. Constitutional: No distress . Vital signs reviewed. HEENT: EOMI, oral membranes moist Neck: supple, still some air leakage from stoma Cardiovascular: RRR without murmur. No JVD    Respiratory: CTA Bilaterally without wheezes or rales. Normal effort    GI: BS +, non-tender, non-distended  Musculoskeletal: 1+ edema left thigh down to 1-2+ left foot. Multiple sutures LLE with scabbed abrasions on left lateral shin. Right calf lac with sutures, and surrounding eschar. . LLE with areas of eschar, minimal drainage.  Left chest wall/back still  tender.  Right facial wounds healing, clean Neurological: He isalertand oriented to person, place, and time. Right facial weakness, decr lid closure. Marland Kitchen Speech surred.  Lifts RLE easily against gravity. wiggles toes on left. LLE limited. Normal sensation Skin: warm, as above Psychiatric: coopeartive, anxious    Assessment/Plan: 1. Functional deficits secondary to TBI/polytrauma which require 3+ hours per day of interdisciplinary therapy in a comprehensive  inpatient rehab setting.  Physiatrist is providing close team supervision and 24 hour management of active medical problems listed below.  Physiatrist and rehab team continue to assess barriers to discharge/monitor patient progress toward functional and medical goals  Care Tool:  Bathing    Body parts bathed by patient: Right arm, Left arm, Chest, Abdomen, Front perineal area, Right upper leg, Left upper leg, Face, Right lower leg, Left lower leg   Body parts bathed by helper: Buttocks     Bathing assist Assist Level: Minimal Assistance - Patient > 75%     Upper Body Dressing/Undressing Upper body dressing   What is the patient wearing?: Pull over shirt    Upper body assist Assist Level: Supervision/Verbal cueing    Lower Body Dressing/Undressing Lower body dressing      What is the patient wearing?: Pants     Lower body assist Assist for lower body dressing: Supervision/Verbal cueing     Toileting Toileting    Toileting assist Assist for toileting: Minimal Assistance - Patient > 75%     Transfers Chair/bed transfer  Transfers assist  Chair/bed transfer activity did not occur: Safety/medical concerns  Chair/bed transfer assist level: Contact Guard/Touching assist     Locomotion Ambulation   Ambulation assist   Ambulation activity did not occur: Safety/medical concerns  Assist level: Minimal Assistance - Patient > 75% Assistive device: Walker-rolling Max distance: 20   Walk 10 feet activity   Assist  Walk 10 feet activity did not occur: Safety/medical concerns  Assist level: Minimal  Assistance - Patient > 75% Assistive device: Walker-rolling   Walk 50 feet activity   Assist Walk 50 feet with 2 turns activity did not occur: Safety/medical concerns         Walk 150 feet activity   Assist Walk 150 feet activity did not occur: Safety/medical concerns         Walk 10 feet on uneven surface  activity   Assist Walk 10 feet on uneven  surfaces activity did not occur: Safety/medical concerns         Wheelchair     Assist Will patient use wheelchair at discharge?: Yes Type of Wheelchair: Manual    Wheelchair assist level: Supervision/Verbal cueing Max wheelchair distance: 150    Wheelchair 50 feet with 2 turns activity    Assist        Assist Level: Supervision/Verbal cueing   Wheelchair 150 feet activity     Assist Wheelchair 150 feet activity did not occur: Safety/medical concerns(fatigue)   Assist Level: Supervision/Verbal cueing  Medical Problem List and Plan: 1.Functional deficitssecondary to TBI/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 DVT Prophylaxis/Anticoagulation: Pharmaceutical:Lovenox 3. Pain Management:Continue scheduled tylenol and robaxin. Oxycodone prn for severe pain.  - kpad, lidoderm patches for chest wall discomfort  -better control  -maintain gabapentin at 600mg  tid, explained that pain is neuropathic in left leg/foot. Need to observe response to gabapentin 4. Mood:LCSW to follow for evaluation and support. 5. Neuropsych: This patientis not fullycapable of making decisions on hisown behalf. 6. Skin/Wound Care:Monitor wounds for healing. Continue nutritional supplements to promote healing. 7. Fluids/Electrolytes/Nutrition:Monitor I/O. Calorie ct/ TF   -protein deficient----increase protein intake  -diet advanced to D2 thins,   -NGT out, eating with small spoons  -needs more rubber bands for mouth 8.Acute hypoxic respiratory failure:Tolerating T collar.Downsized to CFS#6 on 10/28 -trach removed 10/31 by ENT, compressive dressing to stoma. Reminded patient that he needs to apply pressure to stoma when he talks or valsalvas until its sealed.  9. 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 ENT following 11.Bilateral LLE crush injuries with left septic femur fracture and right open fibula fracture with extensive muscle injury: PWB on RLE and WBAT LLE.  12.Right facial and mandible fracture status post ORIF mandible 10/17:  13.Acute stress disorder with anxiety/PTSD:   -Started on prazosin for nightmares. bp holding  -Psychiatry recommends adding Lexapro if prazosin or Atarax ineffective   14. Aspiration PNA: Leucocytosis resolving. Continue Rocephin added 10/20 for 22 day course.   -wbc's falling steadily--9.1 on 11/4 15. ABLA: Continue to monitor for recovery. 9.7 on 10/30, 9.0 11/4 16. Rhabdomyolysis: CK improving 1815-->4575--> 1,963.added water flushes.  -continue to push fluids   17. Abnormal LFTs: returning to near normal range (10/30)       LOS: 8 days A FACE TO FACE EVALUATION WAS PERFORMED  Ranelle Oyster 12/21/2017, 9:00 AM

## 2017-12-21 NOTE — Progress Notes (Signed)
Occupational Therapy Session Note  Patient Details  Name: Willie Blevins MRN: [id number] Date of Birth: 17-May-1975  Today's Date: 12/21/2017 OT Individual Time: 1300-1345 OT Individual Time Calculation (min): 45 min    Short Term Goals: Week 1:  OT Short Term Goal 1 (Week 1): Pt will thread B LE through pants with min A OT Short Term Goal 1 - Progress (Week 1): Met OT Short Term Goal 2 (Week 1): Pt will complete stand pivot transfer to [facility] with min A OT Short Term Goal 2 - Progress (Week 1): Met OT Short Term Goal 3 (Week 1): Pt will complete grooming task EOB with improved cardiovascular endurance, as evidenced by no change in vital signs  OT Short Term Goal 3 - Progress (Week 1): Met OT Short Term Goal 4 (Week 1): Pt will don shirt with (S) sitting EOB OT Short Term Goal 4 - Progress (Week 1): Met Week 2:  OT Short Term Goal 1 (Week 2): STG=LTG d/t ELOS  Skilled Therapeutic Interventions/Progress Updates:    1:1 Pt in bed when arrived. Pt able to come to EOB with min A for management of left LE. Pt able to perform squat/ scoot pivot bed to w/c with setup of w/c. Pt able to self propel to the gym mod I. Pt transferred to the mat with supervision with only one VC for w/c setup. Pt able to demonstrating removing and putting on w/c leg rests. Focus on sit to stands and controlled descents to the mat with one UE support. PT able to perform 5 times using RW for UE support. Performed 15 straight LE raises from EOM but needed to rest inbetween due to pain and SOB. Pt alsom perform 2 UB  Exercises to promote trunk extension and overall strengthening and conditioning. Pt transitioned back to his room and into bed again with min A for management of left LE.  Wife present for session.   Therapy Documentation Precautions:  Precautions Precautions: Fall Precaution Comments: unlimited ROM BLE; ROM encouraged Restrictions Weight Bearing Restrictions: Yes RLE Weight Bearing: Weight bearing as  tolerated LLE Weight Bearing: Partial weight bearing General:   Vital Signs: Therapy Vitals Temp: 97.6 F (36.4 C) Temp Source: Oral Pulse Rate: 98 Resp: 14 BP: 107/60 Patient Position (if appropriate): Lying Oxygen Therapy SpO2: 96 % O2 Device: Room Air Pain: 8-10/10 pain in left LE from knee to ankle. RN and MD aware. Did request meds in session and did recieve  Therapy/Group: Individual Therapy  Willeen Cass [facility] 12/21/2017, 5:49 PM

## 2017-12-21 NOTE — Progress Notes (Signed)
Physical Therapy Session Note  Patient Details  Name: Willie Blevins MRN: [id number] Date of Birth: 06-Jan-1976  Today's Date: 12/21/2017 PT Individual Time: 1400-1525 PT Individual Time Calculation (min): 85 min   Short Term Goals: Week 2:  PT Short Term Goal 1 (Week 2): = LTG  Skilled Therapeutic Interventions/Progress Updates:    Pt received seated in bed, agreeable to PT. Pt reports 9/10 pain in LLE intermittently, some relief with repositioning. Bed mobility min A for LLE management. Squat pivot transfer bed to w/c with Supervision. Manual w/c propulsion x 150 ft with use of BUE and Supervision. Pt is Supervision for management of w/c parts. Stand pivot transfer w/c to bed in simulation apartment with RW and Supervision. Sit to/from supine with min A for LLE management. Family training with patient's spouse assisting him with bed mobility. Pt's spouse demos good safety and performance of assisting patient with bed mobility from flat bed at height similar to patient's bed at home. UBE x 4 min fwd/backward for global endurance training, seated rest break after 4 min. Ascend/descend ramp in w/c with min A with use of BUE to simulate home environment. Seated and supine BLE therex x 10 reps with AAROM for LLE. Pt left semi-reclined in bed with needs in reach, bed alarm in place, spouse present.  Therapy Documentation Precautions:  Precautions Precautions: Fall Precaution Comments: unlimited ROM BLE; ROM encouraged Restrictions Weight Bearing Restrictions: Yes RLE Weight Bearing: Weight bearing as tolerated LLE Weight Bearing: Partial weight bearing   Therapy/Group: Individual Therapy  Peter Congo, PT, DPT  12/21/2017, 3:37 PM

## 2017-12-21 NOTE — Progress Notes (Signed)
Pt wore binder in the am, went to therapy in the afternoon without the binder.

## 2017-12-21 NOTE — Progress Notes (Signed)
Speech Language Pathology Daily Session Note  Patient Details  Name: Willie Blevins MRN: [id number] Date of Birth: 12/10/1975  Today's Date: 12/21/2017 SLP Individual Time: 1130-1200 SLP Individual Time Calculation (min): 30 min  Short Term Goals: Week 1: SLP Short Term Goal 1 (Week 1): Patient will consume thin liquids with minimal overt s/s of aspiration and Mod I for use of swallowing compensatory strategies.  SLP Short Term Goal 2 (Week 1): Patient will utilize speech intelligibility strategies at the sentence level with Mod I to achieve 100% intelligibility.  SLP Short Term Goal 3 (Week 1): Patient will recall new, daily information with Mod I.  SLP Short Term Goal 4 (Week 1): Patinet will dived attention between 2 tasks in a moderatly distracting enviornment for 30 minutes with Mod I.   Skilled Therapeutic Interventions: Skilled treatment session focused on dysphagia and speech goals. SLP facilitated session by providing skilled observation with lunch meal of Dys. 2 textures with thin liquids. Patient demonstrated efficient mastication and consumed meals without overt s/s of aspiration. However, patient required supervision visual cues to self-monitor and correct anterior spillage with use of mirror. Recommend patient continue current diet. Patient was also Mod I for applying pressure to stoma during speech to achieve 100% intelligibility at the conversation level. Patient left upright in bed with all needs within reach. Continue with current plan of care.      Pain No/Denies Pain   Therapy/Group: Individual Therapy  Willie Blevins 12/21/2017, 3:24 PM

## 2017-12-21 NOTE — Progress Notes (Signed)
Occupational Therapy Weekly Progress Note  Patient Details  Name: Willie Blevins MRN: [id number] Date of Birth: Jul 05, 1975  Beginning of progress report period: December 14, 2017 End of progress report period: December 21, 2017  Today's Date: 12/21/2017 OT Individual Time: 6962-9528 OT Individual Time Calculation (min): 74 min    Patient has met 4 of 4 short term goals.  Pt has made great gains in his independence with ADLs and functional transfers. Pt and his wife have been educated and actively participated in use of AE/AD and DME use during ADLs, with excellent carryover demonstrated.   Patient continues to demonstrate the following deficits: muscle weakness, decreased cardiorespiratoy endurance, decreased coordination and decreased standing balance and decreased balance strategies and therefore will continue to benefit from skilled OT intervention to enhance overall performance with BADL.  Patient progressing toward long term goals..  Continue plan of care.  OT Short Term Goals Week 1:  OT Short Term Goal 1 (Week 1): Pt will thread B LE through pants with min A OT Short Term Goal 1 - Progress (Week 1): Met OT Short Term Goal 2 (Week 1): Pt will complete stand pivot transfer to [facility] with min A OT Short Term Goal 2 - Progress (Week 1): Met OT Short Term Goal 3 (Week 1): Pt will complete grooming task EOB with improved cardiovascular endurance, as evidenced by no change in vital signs  OT Short Term Goal 3 - Progress (Week 1): Met OT Short Term Goal 4 (Week 1): Pt will don shirt with (S) sitting EOB OT Short Term Goal 4 - Progress (Week 1): Met Week 2:  OT Short Term Goal 1 (Week 2): STG=LTG d/t ELOS  Skilled Therapeutic Interventions/Progress Updates:    Session focused on b/d tasks, B UE strengthening, and ADL transfers. Pt requried min A for LLE management during bed mobility and lateral scoot  To w/c. Pt completed Lb bathing sitting in front of sink with (S). Cueing provided for LB  dressing technique and w/c management, with pt able to thread B LE through pants and pull up in standing with (S) only! Pt required mod A to don B cam boots. Pt completed 100 ft of w/c propulsion with mod I. Pt completed tub transfer using TTB with w/c support only and cueing for w/c positioning. Pt provided with leg lifter and he was able to use to lift LLE over tub with (S) only. Pt ended session by completing 8 min on NuStep to increase hip AROM for carryover to ADL transfers. Pt returned to room and was left supine in bed with all needs met. Pain reported during session as reported below.   Therapy Documentation Precautions:  Precautions Precautions: Fall Precaution Comments: unlimited ROM BLE; ROM encouraged Restrictions Weight Bearing Restrictions: Yes RLE Weight Bearing: Weight bearing as tolerated LLE Weight Bearing: Partial weight bearing    Pain: Pain Assessment Pain Scale: 0-10 Pain Score: 4  Pain Type: Acute pain Pain Location: Leg Pain Orientation: Left Pain Descriptors / Indicators: Aching;Throbbing Pain Onset: On-going Pain Intervention(s): Emotional support;RN made aware ADL: ADL Grooming: Minimal assistance Where Assessed-Grooming: Edge of bed Upper Body Bathing: Minimal assistance Where Assessed-Upper Body Bathing: Edge of bed Lower Body Bathing: Moderate assistance Where Assessed-Lower Body Bathing: Edge of bed Upper Body Dressing: Minimal assistance Where Assessed-Upper Body Dressing: Edge of bed Lower Body Dressing: Maximal assistance Where Assessed-Lower Body Dressing: Edge of bed Toileting: Not assessed   Therapy/Group: Individual Therapy  Curtis Sites 12/21/2017, 10:42 AM

## 2017-12-22 ENCOUNTER — Inpatient Hospital Stay (HOSPITAL_COMMUNITY): Payer: Self-pay | Admitting: Occupational Therapy

## 2017-12-22 ENCOUNTER — Inpatient Hospital Stay (HOSPITAL_COMMUNITY): Payer: Self-pay

## 2017-12-22 ENCOUNTER — Inpatient Hospital Stay (HOSPITAL_COMMUNITY): Payer: Self-pay | Admitting: Physical Therapy

## 2017-12-22 ENCOUNTER — Inpatient Hospital Stay (HOSPITAL_COMMUNITY): Payer: Worker's Compensation

## 2017-12-22 DIAGNOSIS — M7989 Other specified soft tissue disorders: Secondary | ICD-10-CM

## 2017-12-22 NOTE — Progress Notes (Signed)
Left lower extremity venous duplex has been completed. Negative for DVT.  12/22/17 4:35 PM Olen Cordial RVT

## 2017-12-22 NOTE — Progress Notes (Signed)
Occupational Therapy Session Note  Patient Details  Name: Willie Blevins MRN: [id number] Date of Birth: 08/02/1975  Today's Date: 12/22/2017 OT Individual Time: 1587-2761 OT Individual Time Calculation (min): 75 min    Short Term Goals: Week 1:  OT Short Term Goal 1 (Week 1): Pt will thread B LE through pants with min A OT Short Term Goal 1 - Progress (Week 1): Met OT Short Term Goal 2 (Week 1): Pt will complete stand pivot transfer to [facility] with min A OT Short Term Goal 2 - Progress (Week 1): Met OT Short Term Goal 3 (Week 1): Pt will complete grooming task EOB with improved cardiovascular endurance, as evidenced by no change in vital signs  OT Short Term Goal 3 - Progress (Week 1): Met OT Short Term Goal 4 (Week 1): Pt will don shirt with (S) sitting EOB OT Short Term Goal 4 - Progress (Week 1): Met Week 2:  OT Short Term Goal 1 (Week 2): STG=LTG d/t ELOS  Skilled Therapeutic Interventions/Progress Updates:    Pt received in bed with spouse in the room. Spent time discussing plans for this am. Pt hesitant to shower as he stated it makes him very fatigued and does not feel up to it today.  He did agree to bathe at sink today and shower tomorrow in ADL apt.  Pt provided with a leg lifter to enable him to move L leg in and out of bed. Pt did extremely well with this and was able to sit to EOB with S.   He completed squat pivot to w/c with S and then propelled self to sink.  He completed several sit ><stands at sink with close S.  He is able to wash his bottom but has difficulty fully reaching it in standing.  He will probably be able to reach better on tub bench tomorrow.    Pt stated he had difficulty donning socks prior to injury due to pain with bending forward.  Introduced a Landscape architect, but the pressure of pulling the sock on hurt his ankle.   Pt would benefit from a flexible sock aide that has fabric on one side to allow for a smoother pull.  He will also benefit from a reacher, shoe  horn (for when he is able to don regular shoes) and a long sponge.  Social worker will provide wife with a prescription so she can order through the worker's compensation.   Pt completed self care and resting in w/c with spouse with him.  Therapy Documentation Precautions:  Precautions Precautions: Fall Precaution Comments: unlimited ROM BLE; ROM encouraged Restrictions Weight Bearing Restrictions: Yes RLE Weight Bearing: Weight bearing as tolerated LLE Weight Bearing: Weight bearing as tolerated       Pain: Pain Assessment Pain Scale: 0-10 Pain Score: 9  Pain Location: Leg Pain Orientation: Left Pain Descriptors / Indicators: Aching Pain Frequency: Constant Pain Onset: On-going Pain Intervention(s): Medication (See eMAR)   Therapy/Group: Individual Therapy  [place] 12/22/2017, 1:22 PM

## 2017-12-22 NOTE — Progress Notes (Signed)
Sutures removes to right lower extremity per order; Incision dry and intact; no complaints of pain. Patient tolerated.

## 2017-12-22 NOTE — Progress Notes (Addendum)
Physical Therapy Session Note  Patient Details  Name: Willie Blevins [name] MRN: [id number] Date of Birth: 19-Feb-1975  Today's Date: 12/22/2017 PT Individual Time: 4098-1191 and 4782-9562 PT Individual Time Calculation (min): 54 min and 27 min  Short Term Goals: Week 2:  PT Short Term Goal 1 (Week 2): = LTG  Skilled Therapeutic Interventions/Progress Updates:    pt in w/c agreeable to therapy after having stitches removed. Pt performs w/c mobility with mod I, supervision for parts management.  Squat pivot transfers throughout session with supervision.  Sit <> stand initially CGA, progresses to supervision during session.  Gait x 40' with RW with CGA, improved endurance noted although pt does need prolonged rest due to SOB after gait.  Sidestepping with RW with supervision to simulate home bathroom environment.  Supine therex 2 x 10 AROM for Rt LE, AAROM for Lt LE for strength and ROM.  Pt left in bed with wife present, needs at hand.  Session 2: pt in bed states pain in Lt LE, meds given prior to session.  Pt states fatigue from previous session and requests bed level activity.  PT and pt discussed home safety, energy conservation, and HEP with pt able to perform each exercise x 5. Pt then able to perform supine <> sit with min A and doff cam boots with supervision.  Pt left in bed with needs at hand.  Therapy Documentation Precautions:  Precautions Precautions: Fall Precaution Comments: unlimited ROM BLE; ROM encouraged Restrictions Weight Bearing Restrictions: Yes RLE Weight Bearing: Weight bearing as tolerated LLE Weight Bearing: Partial weight bearing Pain: Pt c/o Lt LE pain with mobility, rest and repositioned as needed   Therapy/Group: Individual Therapy  Vencent Hauschild 12/22/2017, 12:53 PM

## 2017-12-22 NOTE — Progress Notes (Signed)
Speech Language Pathology Discharge Summary  Patient Details  Name: Willie Blevins MRN: [id number] Date of Birth: 1975-12-14  Patient has met 5 of 5 long term goals.  Patient to discharge at overall Modified Independent level.   Reasons goals not met: N/A   Clinical Impression/Discharge Summary: Patient has made excellent gains and has met 5 of 5 LTGs this admission. Currently, patient is consuming Dys. 2 textures with thin liquids without overt s/s of aspiration and overall Mod I for use of swallowing compensatory strategies. Patient will be unable to upgrade his diet until the rubber bands around his mandible are removed and he is cleared medically.  Patient is ~901-00% intelligible at the conversation level and utilizes speech intelligibility strategies with Mod I. However, intelligibility will be impaired due to rubber bands and severe right oral-motor weakness from the injury to his nerve. Patient is overall Mod I for cognitive functioning with basic and familiar tasks. Patient and family education is complete and patient will discharge home with assistance from family. Patient would benefit from f/u SLP services to [facility] his speech and swallowing function once rubber bands are removed.    Care Partner:  Caregiver Able to Provide Assistance: Yes  Type of Caregiver Assistance: Physical  Recommendation:  Home Health SLP;Outpatient SLP  Rationale for SLP Follow Up: Maximize swallowing safety;Maximize functional communication   Equipment: Suction    Reasons for discharge: Treatment goals met   Patient/Family Agrees with Progress Made and Goals Achieved: Yes    Rorik Vespa 12/22/2017, 6:50 AM

## 2017-12-22 NOTE — Progress Notes (Signed)
Occupational Therapy Session Note  Patient Details  Name: Willie Blevins [name] MRN: [id number] Date of Birth: 1976/02/04  Today's Date: 12/22/2017 OT Individual Time: 1415-1500 OT Individual Time Calculation (min): 45 min    Short Term Goals: Week 2:  OT Short Term Goal 1 (Week 2): STG=LTG d/t ELOS  Skilled Therapeutic Interventions/Progress Updates:    Pt seen for OT session focusing on self care tasks specifically LB dressing and functional mobility. Pt in supine upon arrival, voiced generalized pain 8/10 over entire L side of body, reports having been pre-medicated and pain trending downwards. With encouragement, pt willing to participate in session. Used leg lifter to assist with transfer to/from EOB, able to complete with supervision. Sitting EOB, worked on pt donning/doffing CAM boots and non-skid socks. Pt able to complete with supervision/ VCs and significantly increased time 2/2 need for rest breaks due to decreased functional activity tolerance and decreased lung capacity. Following rest break, pt able to ambulate from EOB to door and turn around to return with CGA using RW. Maintaining WBing appropriately without need for cuing. Pt returned to supine at end of session, all needs in reach and bed alarm on.   Therapy Documentation Precautions:  Precautions Precautions: Fall Precaution Comments: unlimited ROM BLE; ROM encouraged Restrictions Weight Bearing Restrictions: Yes RLE Weight Bearing: Weight bearing as tolerated LLE Weight Bearing: Weight bearing as tolerated   Therapy/Group: Individual Therapy  Tristain Daily L 12/22/2017, 3:02 PM

## 2017-12-22 NOTE — Progress Notes (Signed)
[place] PHYSICAL MEDICINE & REHABILITATION PROGRESS NOTE   Subjective/Complaints: Had a better night. Had questions about wound, eye. Eating well  ROS: Patient denies fever, rash, sore throat, blurred vision, nausea, vomiting, diarrhea, cough, shortness of breath or chest pain,  headache, or mood change.   Objective:   No results found. No results for input(s): WBC, HGB, HCT, PLT in the last 72 hours. No results for input(s): NA, K, CL, CO2, GLUCOSE, BUN, CREATININE, CALCIUM in the last 72 hours.  Intake/Output Summary (Last 24 hours) at 12/22/2017 1122 Last data filed at 12/22/2017 0853 Gross per 24 hour  Intake 926.46 ml  Output -  Net 926.46 ml     Physical Exam: Vital Signs Blood pressure 101/62, pulse 88, temperature 98.3 F (36.8 C), temperature source Oral, resp. rate 14, height 6\' 2"  (1.88 m), weight 95.3 kg, SpO2 93 %. Constitutional: No distress . Vital signs reviewed. HEENT: EOMI, oral membranes moist. Sclera white/non-irritated Neck: supple Cardiovascular: RRR without murmur. No JVD    Respiratory: CTA Bilaterally without wheezes or rales. Normal effort    GI: BS +, non-tender, non-distended  Musculoskeletal: 1+  To tr bilateral LE Edema.  LLE with scabbed abrasions on left lateral shin. Right calf lac with sutures, and surrounding eschar.  LLE with areas of eschar, no drainage.  Left chest wall/back still  tender.  Right facial all wounds healing, clean Neurological: He isalertand oriented to person, place, and time. Right facial weakness, unable to close eye lid . Speech surred.  Lifts RLE easily against gravity. wiggles toes on left. LLE limited. Normal sensation Skin: warm, as above Psychiatric: coopeartive, anxious    Assessment/Plan: 1. 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  Physiatrist is providing close team supervision and 24 hour  management of active medical problems listed below.  Physiatrist and rehab team continue to assess barriers to discharge/monitor patient progress toward functional and medical goals  Care Tool:  Bathing    Body parts bathed by patient: Right upper leg, Left upper leg, Face, Right lower leg, Left lower leg, Buttocks, Front perineal area   Body parts bathed by helper: Buttocks     Bathing assist Assist Level: Contact Guard/Touching assist     Upper Body Dressing/Undressing Upper body dressing   What is the patient wearing?: Pull over shirt    Upper body assist Assist Level: Supervision/Verbal cueing    Lower Body Dressing/Undressing Lower body dressing      What is the patient wearing?: Pants     Lower body assist Assist for lower body dressing: Supervision/Verbal cueing     Toileting Toileting    Toileting assist Assist for toileting: Contact Guard/Touching assist     Transfers Chair/bed transfer  Transfers assist  Chair/bed transfer activity did not occur: Safety/medical concerns  Chair/bed transfer assist level: Set up assist     Locomotion Ambulation   Ambulation assist   Ambulation activity did not occur: Safety/medical concerns  Assist level: Minimal Assistance - Patient > 75% Assistive device: Walker-rolling Max distance: 20   Walk 10 feet activity   Assist  Walk 10 feet activity did not occur: Safety/medical concerns  Assist level: Minimal Assistance - Patient > 75% Assistive device: Walker-rolling   Walk 50 feet activity   Assist Walk 50 feet with 2 turns activity did not occur: Safety/medical concerns         Walk 150 feet activity   Assist  Walk 150 feet activity did not occur: Safety/medical concerns         Walk 10 feet on uneven surface  activity   Assist Walk 10 feet on uneven surfaces activity did not occur: Safety/medical concerns         Wheelchair     Assist Will patient use wheelchair at discharge?:  Yes Type of Wheelchair: Manual    Wheelchair assist level: Supervision/Verbal cueing Max wheelchair distance: 150'    Wheelchair 50 feet with 2 turns activity    Assist        Assist Level: Supervision/Verbal cueing   Wheelchair 150 feet activity     Assist Wheelchair 150 feet activity did not occur: Safety/medical concerns(fatigue)   Assist Level: Supervision/Verbal cueing  Medical Problem List and Plan: 1.Functional deficitssecondary to TBI/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 DVT Prophylaxis/Anticoagulation: Pharmaceutical:Lovenox 3. Pain Management:Continue scheduled tylenol and robaxin. Oxycodone prn for severe pain.  - kpad, lidoderm patches for chest wall discomfort  - maintain gabapentin at 600mg  tid, explained that pain is neuropathic in left leg/foot. Need to observe response to gabapentin 4. Mood:LCSW to follow for evaluation and support. 5. Neuropsych: This patientis capable of making decisions on hisown behalf.  6. Skin/Wound Care:can change lower ext dressings to dry  -remove sutures RLE 7. Fluids/Electrolytes/Nutrition:Monitor I/O. Calorie ct/ TF   -protein deficient----increased protein intake  -diet advanced to D2 thins,   -eating better 8.Acute hypoxic respiratory failure:Tolerating T collar.Downsized to CFS#6 on 10/28 -trach removed 10/31 by ENT, compressive dressing to stoma. Reminded patient that he needs to apply pressure to stoma when he talks or valsalvas until its sealed.  9. 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 ENT following 11.Bilateral LLE crush injuries with left septic femur fracture and  right open fibula fracture with extensive muscle injury: PWB on RLE and WBAT LLE.  12.Right facial and mandible fracture status post ORIF mandible 10/17:  13.Acute stress disorder with anxiety/PTSD:   -Started on prazosin for nightmares. bp holding    14. Aspiration PNA: Leucocytosis resolving. Continue Rocephin added 10/20 for 22 day course.   -wbc's falling steadily--9.1 on 11/4 15. ABLA: Continue to monitor for recovery. 9.7 on 10/30, 9.0 11/4 16. Rhabdomyolysis: CK improving 1815-->4575--> 1,963.added water flushes.  -continue to push fluids   17. Abnormal LFTs: returning to near normal range (10/30)       LOS: 9 days A FACE TO FACE EVALUATION WAS PERFORMED  Ranelle Oyster 12/22/2017, 11:22 AM

## 2017-12-22 NOTE — Progress Notes (Signed)
Occupational Therapy Discharge Summary  Patient Details  Name: Willie Blevins MRN: [id number] Date of Birth: Sep 23, 1975  Today's Date: 12/23/2017 OT Individual Time: 9381-8299 and 1430-1526 OT Individual Time Calculation (min): 88 min  And 56 min   1:1. Pt with 8/10 pain in LLE. Pt agreeable to wash up and dress at sink level sit to stand. Pt completes all transfers to various surfaces stand pivot with RW or squat pivot EOB<>w/c<>BSC/TTB with supervision and VC for locking w/c brakes. Pt bathes and dresses sit to stand at sink with set up. Pt completes donning B cam boots with VC for folding bottom foam flap back to avoid getting caught under foot.  Pt reporting making it much easier to don cam boots. Pt requires significantly increased time to don cam boots d/t SOB/fatigue. Pt voids bladder and attempts bowel on BSC next to bed, pt completes clothing management and hygien wit supervision in standing. Pt completes seated [place] activity of making no sew blanket cutting strips of fabric and tying knots with increased time and min VC for sequencing fading to supervision for novel task. Exited session with pt seated in bed, call light in reach and all needs met  Session 2: Pt received seated in bed asking questions about D/C instrucitons, equipment delivery and w/c fit. OT answers all questions. Pt completes supine>sitting EOB to don cam boots with set up and completes lateral scoot with set up of w/c. Pt propels w/c to/from ADL apartment with MOD I. Pt educated on set up for lateral scoot transfer to TTB. Pt will have to back in at home d/t narrow bathroom. Pt completes lateral scoot transfer with supervision and use of leg lifter to bring LE into tub.   Patient has met 10 of 10 long term goals due to improved activity tolerance, improved balance, postural control, ability to compensate for deficits, improved awareness and improved coordination.  Patient to discharge at overall Supervision level.   Patient's care partner is independent to provide the necessary physical assistance at discharge.    Reasons goals not met: All treatment goals met  Recommendation:  Patient will benefit from ongoing skilled OT services in home health setting to continue to advance functional skills in the area of BADL and Reduce care partner burden.  Equipment: BSC, TTB, RW, w/c  Reasons for discharge: treatment goals met and discharge from hospital  Patient/family agrees with progress made and goals achieved: Yes  OT Discharge Precautions/Restrictions  Precautions Precautions: Fall Precaution Comments: unlimited ROM BLE; ROM encouraged Required Braces or Orthoses: Other Brace/Splint Other Brace/Splint: bilat cam boots Restrictions Weight Bearing Restrictions: Yes RLE Weight Bearing: Weight bearing as tolerated LLE Weight Bearing: Weight bearing as tolerated Vital Signs Therapy Vitals Temp: 98.4 F (36.9 C) Temp Source: Oral Pulse Rate: 88 Resp: 16 BP: 110/68 Patient Position (if appropriate): Sitting Oxygen Therapy SpO2: 94 % O2 Device: Room Air Pain Pain Assessment Pain Scale: 0-10 Pain Score: 9  Pain Type: Acute pain Pain Location: Leg Pain Orientation: Left Pain Descriptors / Indicators: Aching Pain Onset: On-going Pain Intervention(s): RN made aware ADL ADL Grooming: Minimal assistance Where Assessed-Grooming: Edge of bed Upper Body Bathing: Minimal assistance Where Assessed-Upper Body Bathing: Edge of bed Lower Body Bathing: Moderate assistance Where Assessed-Lower Body Bathing: Edge of bed Upper Body Dressing: Minimal assistance Where Assessed-Upper Body Dressing: Edge of bed Lower Body Dressing: Maximal assistance Where Assessed-Lower Body Dressing: Edge of bed Toileting: Not assessed Vision Baseline Vision/History: No visual deficits Patient Visual Report: Other (comment)(pt's  R eye taped shut, however no further visual impairment) Vision Assessment?: No  apparent visual deficits Additional Comments: R eye covered, no further impairment Perception  Perception: Within Functional Limits Praxis Praxis: Intact Cognition Overall Cognitive Status: Within Functional Limits for tasks assessed Arousal/Alertness: Awake/alert Orientation Level: Oriented X4 Attention: Divided Divided Attention: Appears intact Memory: Appears intact Awareness: Appears intact Problem Solving: Appears intact Reasoning: Appears intact Safety/Judgment: Appears intact Sensation Sensation Light Touch: Impaired Detail Central sensation comments: Numbness in L leg Coordination Gross Motor Movements are Fluid and Coordinated: No Fine Motor Movements are Fluid and Coordinated: Yes Coordination and Movement Description: pt still limited by pain and generalized weakness Finger Nose Finger Test: [facility] Motor  Motor Motor: Within Functional Limits Motor - Discharge Observations: limited by pain, but overall WFL Mobility  Bed Mobility Bed Mobility: Supine to Sit;Sit to Supine Supine to Sit: Minimal Assistance - Patient > 75% Sit to Supine: Minimal Assistance - Patient > 75% Transfers Sit to Stand: Supervision/Verbal cueing Stand to Sit: Supervision/Verbal cueing  Trunk/Postural Assessment  Cervical Assessment Cervical Assessment: Within Functional Limits Thoracic Assessment Thoracic Assessment: Within Functional Limits Lumbar Assessment Lumbar Assessment: Within Functional Limits Postural Control Postural Control: Within Functional Limits  Balance Balance Balance Assessed: Yes Static Sitting Balance Static Sitting - Balance Support: Feet supported;Bilateral upper extremity supported Static Sitting - Level of Assistance: 7: Independent Dynamic Sitting Balance Dynamic Sitting - Balance Support: Feet supported;During functional activity Dynamic Sitting - Level of Assistance: 7: Independent Static Standing Balance Static Standing - Balance Support: During  functional activity;Bilateral upper extremity supported Static Standing - Level of Assistance: 5: Stand by assistance Dynamic Standing Balance Dynamic Standing - Balance Support: Bilateral upper extremity supported;During functional activity Dynamic Standing - Level of Assistance: 5: Stand by assistance Extremity/Trunk Assessment RUE Assessment RUE Assessment: Within Functional Limits LUE Assessment LUE Assessment: Within Functional Limits   Curtis Sites 12/22/2017, 4:09 PM

## 2017-12-22 NOTE — Patient Care Conference (Signed)
Inpatient RehabilitationTeam Conference and Plan of Care Update Date: 12/20/2017   Time: 2:40 PM    Patient Name: Willie Blevins      Medical Record Number: [id number]  Date of Birth: November 01, 1975 Sex: Male         Room/Bed: 4W12C/4W12C-01 Payor Info: Payor: [employer] / Plan: [employer] / Product Type: *No Product type* /    Admitting Diagnosis: TBI Polytrauma  Admit Date/Time:  12/13/2017  2:17 PM Admission Comments: No comment available   Primary Diagnosis:  <principal problem not specified> Principal Problem: <principal problem not specified>  Patient Active Problem List   Diagnosis Date Noted  . Skull fracture with cerebral contusion (HCC) 12/13/2017  . Aspiration pneumonia of right lower lobe due to vomit (HCC)   . Tracheostomy status (HCC)   . PTSD (post-traumatic stress disorder)   . Acute stress disorder   . Closed extensive facial fractures (HCC)   . Fracture   . Liver injury, laceration   . Trauma   . Traumatic brain injury with loss of consciousness (HCC)   . Pulmonary emphysema (HCC)   . Hyperglycemia   . Post-op pain   . Hyponatremia   . Crush injury, leg, lower, bilateral  12/02/2017  . Open right fibular fracture 12/02/2017  . Closed left subtrochanteric femur fracture (HCC) 11/29/2017    Expected Discharge Date: Expected Discharge Date: 12/24/17  Team Members Present: Physician leading conference: Dr. Faith Rogue Social Worker Present: Amada Jupiter, LCSW Nurse Present: Keturah Barre, RN PT Present: Judieth Keens, PT OT Present: Other (comment)(Sandra Earlene Plater, OT) SLP Present: Feliberto Gottron, SLP PPS Coordinator present : Tora Duck, RN, CRRN     Current Status/Progress Goal Weekly Team Focus  Medical   polytrauma, TBI with significant skull fracutre and damage to right facial nerve. jaws remain partially wired, trach is out.   improve nutrition and tolerance of diet  see above, locl wound care, pain control,    Bowel/Bladder   Pt is continent of B/B. LBM 12/19/2017  Remain continent of B/B. Maintian regular bowel pattern  Assist with toileting needs PRN   Swallow/Nutrition/ Hydration   Dys. 2 textures with thin liquids, Supervision   Mod I  Family Education    ADL's   min A LB ADLs, (S) UB ADLS, min A stand pivot  (S) ADLs/transfers  LB ADLs, dynamic standing balance, pain management, ADL transfers   Mobility   supervision transfers, min A gait  supervision overall  family ed, activity tolerance   Communication   Supervision  Mod I  Family Education    Safety/Cognition/ Behavioral Observations  Mod I  Mod I  Family Education    Pain   Pt has constant pain of 8 on scale of 0-10. Pain managed by Tylenol and Oxycodone   Pain <5  Assess pain Q shift and PRN   Skin   R ear has scabbed over laceration. R leg sutures, L hip sutures removed 12/19/2017, L leg clean, dry, intact with gauze, Neck clean, dry, intact with gauze  Maintain skin integrity and prevent skin breakdown. Promote continued healing via orders   Assess skin Q shift and PRN     Rehab Goals Patient on target to meet rehab goals: Yes *See Care Plan and progress notes for long and short-term goals.     Barriers to Discharge  Current Status/Progress Possible Resolutions Date Resolved   Physician    Medical stability;Wound Care        family ed  Nursing                  PT                    OT                  SLP                SW                Discharge Planning/Teaching Needs:  Home with spouse who can provide 24/7 assistance.  Teaching is ongoing as wife is staying at the hospital   Team Discussion:   Still with pain issues but trying to wean down meds.  D2 , thin with baby spoon due to rubber bands.  Car tfs today with min assist.  Reaching min assist goals overall.   Review of DME and follow up needs.  Revisions to Treatment Plan:  NA    Continued Need for Acute Rehabilitation Level of Care: The patient requires daily  medical management by a physician with specialized training in physical medicine and rehabilitation for the following conditions: Daily direction of a multidisciplinary physical rehabilitation program to ensure safe treatment while eliciting the highest outcome that is of practical value to the patient.: Yes Daily medical management of patient stability for increased activity during participation in an intensive rehabilitation regime.: Yes Daily analysis of laboratory values and/or radiology reports with any subsequent need for medication adjustment of medical intervention for : Neurological problems;Wound care problems   I attest that I was present, lead the team conference, and concur with the assessment and plan of the team.   Willie Blevins 12/22/2017, 10:36 AM

## 2017-12-22 NOTE — Progress Notes (Signed)
Occupational Therapy Session Note  Patient Details  Name: Willie Blevins MRN: [id number] Date of Birth: January 14, 1976  Today's Date: 12/22/2017 OT Individual Time: 1300-1400 OT Individual Time Calculation (min): 60 min    Short Term Goals: Week 2:  OT Short Term Goal 1 (Week 2): STG=LTG d/t ELOS  Skilled Therapeutic Interventions/Progress Updates:    Session focused on toileting tasks, standing balance, and posterior reaching to simulate peri care. Pt transferred to [facility] via stand pivot with set up. Pt initially requested assistance with clothing management, but with cueing/encouragement was able to complete with (S) and unilateral support on RW. Pt stood and again, requested assistance with posterior peri hygiene but was encouraged to complete, and pt did so with (S). Pt propelled w/c to therapy gym with (S), 130f, and transferred to mat with set up only. RN entered to give pt pain medication d/t pt as described below. Pt stood with RW and completed posterior reaching task to simulate posterior peri hygiene. Pt required (S) only in standing. Pt returned to room and was left supine in bed with all needs met. Discussed d/c planning and HAP.  Therapy Documentation Precautions:  Precautions Precautions: Fall Precaution Comments: unlimited ROM BLE; ROM encouraged Restrictions Weight Bearing Restrictions: Yes RLE Weight Bearing: Weight bearing as tolerated LLE Weight Bearing: Weight bearing as tolerated Therapy Vitals Temp: 98.4 F (36.9 C) Temp Source: Oral Pulse Rate: 88 Resp: 16 BP: 110/68 Patient Position (if appropriate): Sitting Oxygen Therapy SpO2: 94 % O2 Device: Room Air Pain: Pain Assessment Pain Scale: 0-10 Pain Score: 9  Pain Type: Acute pain Pain Location: Leg Pain Orientation: Left Pain Descriptors / Indicators: Aching Pain Onset: On-going Pain Intervention(s): RN made aware   Therapy/Group: Individual Therapy  SCurtis Sites11/08/2017, 4:01 PM

## 2017-12-22 NOTE — Plan of Care (Signed)
  Problem: RH SAFETY Goal: RH STG ADHERE TO SAFETY PRECAUTIONS W/ASSISTANCE/DEVICE Description STG Adhere to Safety Precautions With cues/reminders Assistance/Device.  Outcome: Progressing  Call light within reach, bed alarm,  Problem: RH PAIN MANAGEMENT Goal: RH STG PAIN MANAGED AT OR BELOW PT'S PAIN GOAL Description At or below level 6 with medications  Outcome: Progressing  Administering pain regimen as ordered and needed

## 2017-12-23 ENCOUNTER — Inpatient Hospital Stay (HOSPITAL_COMMUNITY): Payer: Self-pay | Admitting: Physical Therapy

## 2017-12-23 ENCOUNTER — Inpatient Hospital Stay (HOSPITAL_COMMUNITY): Payer: Self-pay

## 2017-12-23 MED ORDER — ENOXAPARIN SODIUM 40 MG/0.4ML ~~LOC~~ SOLN: 40.0000 mg | SUBCUTANEOUS | 0 refills | Status: DC

## 2017-12-23 MED ORDER — ORAL CARE MOUTH RINSE: 15.0000 mL | OROMUCOSAL | 0 refills | Status: DC

## 2017-12-23 MED ORDER — METHOCARBAMOL 750 MG PO TABS: 750.0000 mg | ORAL_TABLET | Freq: Three times a day (TID) | ORAL | 1 refills | Status: DC

## 2017-12-23 MED ORDER — TRAZODONE HCL 50 MG PO TABS: 25.0000 mg | ORAL_TABLET | Freq: Every evening | ORAL | 0 refills | Status: DC | PRN

## 2017-12-23 MED ORDER — POLYVINYL ALCOHOL 1.4 % OP SOLN: 2.0000 [drp] | OPHTHALMIC | 4 refills | Status: DC

## 2017-12-23 MED ORDER — FLORANEX PO PACK: 1.0000 g | PACK | Freq: Three times a day (TID) | ORAL | 0 refills | Status: DC

## 2017-12-23 MED ORDER — FAMOTIDINE 20 MG PO TABS: 20.0000 mg | ORAL_TABLET | Freq: Two times a day (BID) | ORAL | 0 refills | Status: DC

## 2017-12-23 MED ORDER — ACETAMINOPHEN 500 MG PO TABS: 500.0000 mg | ORAL_TABLET | Freq: Three times a day (TID) | ORAL | 0 refills | Status: AC

## 2017-12-23 MED ORDER — PRAZOSIN HCL 1 MG PO CAPS: 1.0000 mg | ORAL_CAPSULE | Freq: Every day | ORAL | 0 refills | Status: DC

## 2017-12-23 MED ORDER — BENZOCAINE 10 % MT GEL: Freq: Four times a day (QID) | OROMUCOSAL | 0 refills | Status: AC | PRN

## 2017-12-23 MED ORDER — GABAPENTIN 600 MG PO TABS: 600.0000 mg | ORAL_TABLET | Freq: Three times a day (TID) | ORAL | 0 refills | Status: DC

## 2017-12-23 MED ORDER — HYDROXYZINE HCL 25 MG PO TABS: 25.0000 mg | ORAL_TABLET | Freq: Three times a day (TID) | ORAL | 0 refills | Status: DC | PRN

## 2017-12-23 MED ORDER — OXYCODONE HCL 5 MG PO TABS: 5.0000 mg | ORAL_TABLET | Freq: Four times a day (QID) | ORAL | 0 refills | Status: DC | PRN

## 2017-12-23 MED ORDER — ARTIFICIAL TEARS OPHTHALMIC OINT: TOPICAL_OINTMENT | Freq: Every evening | OPHTHALMIC | 2 refills | Status: AC | PRN

## 2017-12-23 MED ORDER — CALCIUM POLYCARBOPHIL 625 MG PO TABS: 625.0000 mg | ORAL_TABLET | Freq: Two times a day (BID) | ORAL | 0 refills | Status: DC

## 2017-12-23 MED ORDER — ASCORBIC ACID 1000 MG PO TABS: 1000.0000 mg | ORAL_TABLET | Freq: Every day | ORAL | 0 refills | Status: AC

## 2017-12-23 MED ORDER — ARTIFICIAL TEARS OPHTHALMIC OINT: TOPICAL_OINTMENT | Freq: Every evening | OPHTHALMIC | 2 refills | Status: DC | PRN

## 2017-12-23 MED ORDER — LIDOCAINE 5 % EX PTCH: MEDICATED_PATCH | CUTANEOUS | 0 refills | Status: DC

## 2017-12-23 NOTE — Progress Notes (Signed)
Physical Therapy Discharge Summary  Patient Details  Name: Willie Blevins MRN: [id number] Date of Birth: 02/06/1976  Today's Date: 12/23/2017 PT Individual Time: 1100-1145 PT Individual Time Calculation (min): 45 min   Patient participated in grad day activities in therapy and performed bed mobility with supervision for supine to sit, min A sit to supine, gait 25', 50' with RW and CGA.  Simulated car transfers min A.  Step up to 4''step with min A with RW.  W/c mobility in a variety of environments with supervision.  PA then came and undressed pt's wound to inspect Lt LE.  Pt then assisted back to bed for RN to redress wound, PT dons TED hose per PA order.  Pt states he feels ready for d/c home tomorrow.  Patient has met 7 of 7 long term goals due to improved activity tolerance, improved balance, increased strength, decreased pain and ability to compensate for deficits.  Patient to discharge at an ambulatory level Supervision.   Patient's care partner is independent to provide the necessary physical assistance at discharge.  Reasons goals not met: n/a  Recommendation:  Patient will benefit from ongoing skilled PT services in home health setting to continue to advance safe functional mobility, address ongoing impairments in strength, gait, ROM, balance, and minimize fall risk.  Equipment: w/c, RW  Reasons for discharge: treatment goals met and discharge from hospital  Patient/family agrees with progress made and goals achieved: Yes  PT Discharge Precautions/Restrictions Precautions Precautions: Fall Precaution Comments: unlimited ROM BLE; ROM encouraged Required Braces or Orthoses: Other Brace/Splint Other Brace/Splint: bilat cam boots Restrictions Weight Bearing Restrictions: Yes RLE Weight Bearing: Weight bearing as tolerated LLE Weight Bearing: Weight bearing as tolerated Pain Pain Assessment Pain Score: 8  Faces Pain Scale: Hurts little more Pain Type: Acute pain Pain  Location: Leg Pain Orientation: Left Pain Descriptors / Indicators: Aching Pain Onset: On-going Pain Intervention(s): Repositioned;Rest  Cognition Overall Cognitive Status: Within Functional Limits for tasks assessed Arousal/Alertness: Awake/alert Orientation Level: Oriented X4 Alternating Attention: Appears intact Divided Attention: Appears intact Memory: Appears intact Problem Solving: Appears intact Reasoning: Appears intact Sensation Sensation Central sensation comments: Numbness in L leg Proprioception: Appears Intact Coordination Gross Motor Movements are Fluid and Coordinated: Yes Fine Motor Movements are Fluid and Coordinated: Yes Coordination and Movement Description: pt still limited by pain and generalized weakness Motor  Motor Motor - Discharge Observations: limited by pain, but overall WFL  Mobility Bed Mobility Supine to Sit: Supervision/Verbal cueing Sit to Supine: Supervision/Verbal cueing Transfers Sit to Stand: Supervision/Verbal cueing Stand to Sit: Supervision/Verbal cueing Lateral/Scoot Transfers: Supervision/Verbal cueing Locomotion  Gait Ambulation: Yes Gait Assistance: Contact Guard/Touching assist Gait Distance (Feet): 50 Feet Assistive device: Rolling walker Stairs / Additional Locomotion Stairs: No Wheelchair Mobility Wheelchair Mobility: Yes Wheelchair Assistance: Chartered loss adjuster: Both upper extremities Distance: 200  Trunk/Postural Assessment  Cervical Assessment Cervical Assessment: Within Functional Limits Thoracic Assessment Thoracic Assessment: Within Functional Limits Lumbar Assessment Lumbar Assessment: Within Functional Limits Postural Control Postural Control: Within Functional Limits  Balance Static Sitting Balance Static Sitting - Level of Assistance: 7: Independent Dynamic Sitting Balance Dynamic Sitting - Level of Assistance: 7: Independent Static Standing Balance Static Standing -  Level of Assistance: 5: Stand by assistance Dynamic Standing Balance Dynamic Standing - Level of Assistance: 5: Stand by assistance Extremity Assessment      RLE Assessment General Strength Comments: grossly 4/5 throughout LLE Assessment General Strength Comments: hip flexion 3/5, knee extension 3/5, knee flexion 3/5, ankle dorsiflexion/plantarflexion  4/5    Matix Henshaw 12/23/2017, 12:17 PM

## 2017-12-23 NOTE — Progress Notes (Signed)
[place] PHYSICAL MEDICINE & REHABILITATION PROGRESS NOTE   Subjective/Complaints: Left leg swelled yesterday?? No problems with it this morning.   ROS: Patient denies fever, rash, sore throat, blurred vision, nausea, vomiting, diarrhea, cough, shortness of breath or chest pain,  headache, or mood change.    Objective:   Vas [place] Lower Extremity Venous (dvt)  Result Date: 12/23/2017  Lower Venous Study Indications: Swelling.  Performing Technologist: Chanda Busing RVT  Examination Guidelines: A complete evaluation includes B-mode imaging, spectral Doppler, color Doppler, and power Doppler as needed of all accessible portions of each vessel. Bilateral testing is considered an integral part of a complete examination. Limited examinations for reoccurring indications may be performed as noted.  Right Venous Findings: +---+---------------+---------+-----------+----------+-------+    CompressibilityPhasicitySpontaneityPropertiesSummary +---+---------------+---------+-----------+----------+-------+ CFVFull           Yes      Yes                          +---+---------------+---------+-----------+----------+-------+  Left Venous Findings: +---------+---------------+---------+-----------+----------+-------+          CompressibilityPhasicitySpontaneityPropertiesSummary +---------+---------------+---------+-----------+----------+-------+ CFV      Full           Yes      Yes                          +---------+---------------+---------+-----------+----------+-------+ SFJ      Full                                                 +---------+---------------+---------+-----------+----------+-------+ FV Prox  Full                                                 +---------+---------------+---------+-----------+----------+-------+ FV Mid   Full                                                 +---------+---------------+---------+-----------+----------+-------+ FV  DistalFull                                                 +---------+---------------+---------+-----------+----------+-------+ PFV      Full                                                 +---------+---------------+---------+-----------+----------+-------+ POP      Full           Yes      Yes                          +---------+---------------+---------+-----------+----------+-------+ PTV      Full                                                 +---------+---------------+---------+-----------+----------+-------+  PERO     Full                                                 +---------+---------------+---------+-----------+----------+-------+    Summary: Right: No evidence of common femoral vein obstruction. Left: There is no evidence of deep vein thrombosis in the lower extremity. No cystic structure found in the popliteal fossa.  *See table(s) above for measurements and observations. Electronically signed by Lemar Livings MD on 12/23/2017 at 8:28:55 AM.    Final    No results for input(s): WBC, HGB, HCT, PLT in the last 72 hours. No results for input(s): NA, K, CL, CO2, GLUCOSE, BUN, CREATININE, CALCIUM in the last 72 hours.  Intake/Output Summary (Last 24 hours) at 12/23/2017 0922 Last data filed at 12/22/2017 1300 Gross per 24 hour  Intake 300 ml  Output -  Net 300 ml     Physical Exam: Vital Signs Blood pressure 112/65, pulse 90, temperature 98.4 F (36.9 C), temperature source Oral, resp. rate 18, height 6\' 2"  (1.88 m), weight 93.7 kg, SpO2 94 %.  Constitutional: No distress . Vital signs reviewed. HEENT: EOMI, oral membranes moist Neck: supple Cardiovascular: RRR without murmur. No JVD    Respiratory: CTA Bilaterally without wheezes or rales. Normal effort    GI: BS +, non-tender, non-distended  Musculoskeletal: 1+  To tr bilateral LE Edema this morning.  LLE with scabbed abrasions on left lateral shin. Right calf lac with sutures, and surrounding  eschar.  LLE with areas of eschar, minimal drainage. Right facial all wounds clean Neurological: He isalertand oriented to person, place, and time. Right facial weakness, unable to close eye lid . Speech surred.  Lifts RLE easily against gravity. wiggles toes on left. LLE limited. Normal sensation Skin: warm, as above Psychiatric: cooperative    Assessment/Plan: 1. 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  Physiatrist is providing close team supervision and 24 hour management of active medical problems listed below.  Physiatrist and rehab team continue to assess barriers to discharge/monitor patient progress toward functional and medical goals  Care Tool:  Bathing    Body parts bathed by patient: Right upper leg, Left upper leg, Face, Right lower leg, Left lower leg, Buttocks, Front perineal area, Right arm, Left arm, Chest, Abdomen   Body parts bathed by helper: Buttocks     Bathing assist Assist Level: Set up assist     Upper Body Dressing/Undressing Upper body dressing   What is the patient wearing?: Pull over shirt    Upper body assist Assist Level: Supervision/Verbal cueing    Lower Body Dressing/Undressing Lower body dressing      What is the patient wearing?: Pants     Lower body assist Assist for lower body dressing: Supervision/Verbal cueing     Toileting Toileting    Toileting assist Assist for toileting: Supervision/Verbal cueing     Transfers Chair/bed transfer  Transfers assist  Chair/bed transfer activity did not occur: Safety/medical concerns  Chair/bed transfer assist level: Supervision/Verbal cueing     Locomotion Ambulation   Ambulation assist   Ambulation activity did not occur: Safety/medical concerns  Assist level: Minimal Assistance - Patient > 75% Assistive device: Walker-rolling Max distance: 20   Walk 10 feet  activity   Assist  Walk 10  feet activity did not occur: Safety/medical concerns  Assist level: Minimal Assistance - Patient > 75% Assistive device: Walker-rolling   Walk 50 feet activity   Assist Walk 50 feet with 2 turns activity did not occur: Safety/medical concerns         Walk 150 feet activity   Assist Walk 150 feet activity did not occur: Safety/medical concerns         Walk 10 feet on uneven surface  activity   Assist Walk 10 feet on uneven surfaces activity did not occur: Safety/medical concerns         Wheelchair     Assist Will patient use wheelchair at discharge?: Yes Type of Wheelchair: Manual    Wheelchair assist level: Supervision/Verbal cueing Max wheelchair distance: 150'    Wheelchair 50 feet with 2 turns activity    Assist        Assist Level: Supervision/Verbal cueing   Wheelchair 150 feet activity     Assist Wheelchair 150 feet activity did not occur: Safety/medical concerns(fatigue)   Assist Level: Supervision/Verbal cueing  Medical Problem List and Plan: 1.Functional deficitssecondary to 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   -surgical follow up as advised--ENT will need to make further recs re: his broken teeth once he follows up  2. DVT Prophylaxis/Anticoagulation: Pharmaceutical:Lovenox  -dopplers negative  -compression/elevation of LE for edema control 3. Pain Management:Continue scheduled tylenol and robaxin. Oxycodone prn for severe pain.  - kpad, lidoderm patches for chest wall discomfort  - continue gabapentin at 600mg  tid, explained that pain is neuropathic in left leg/foot. Need to observe response to gabapentin 4. Mood:LCSW to follow for evaluation and support. 5. Neuropsych: This patientis capable of making decisions on hisown behalf.  6. Skin/Wound  Care:continue dry dressings (oil emersion if needed) to legs  -removed sutures RLE 7. Fluids/Electrolytes/Nutrition:Monitor I/O. Calorie ct/ TF   -protein deficient----increased protein intake  -diet advanced to D2 thins,   -eating fairly well 8.Acute hypoxic respiratory failure:Tolerating T collar. -trach removed 10/31 by ENT, stoma closing 9. 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 ENT following 11.Bilateral LLE crush injuries with left septic femur fracture and right open fibula fracture with extensive muscle injury: PWB on RLE and WBAT LLE.  12.Right facial and mandible fracture status post ORIF mandible 10/17:  13.Acute stress disorder with anxiety/PTSD:   -Started on prazosin for nightmares. bp holding    14. Aspiration PNA: Leucocytosis resolving. Continue Rocephin added 10/20 for 22 day course.   -wbc's falling steadily--9.1 on 11/4 15. ABLA: Continue to monitor for recovery. 9.7 on 10/30, 9.0 11/4 16. Rhabdomyolysis: CK improving 1815-->4575--> 1,963.added water flushes.  -continue to push fluids   17. Abnormal LFTs: returning to near normal range (10/30)       LOS: 10 days A FACE TO FACE EVALUATION WAS PERFORMED  Ranelle Oyster 12/23/2017, 9:22 AM

## 2017-12-23 NOTE — Discharge Summary (Signed)
Physician Discharge Summary  Patient ID: Willie Blevins MRN: [id number] DOB/AGE: 40-Apr-1977 40 y.o.  Admit date: 12/13/2017 Discharge date: 12/27/2017  Discharge Diagnoses:  Principal Problem:   Traumatic brain injury with loss of consciousness [facility]) Active Problems:   Closed left subtrochanteric femur fracture ([place])   Crush injury, leg, lower, bilateral    Aspiration pneumonia of right lower lobe due to vomit ([place])   PTSD (post-traumatic stress disorder)   Mandible fracture ([facility])   Traumatic rhabdomyolysis ([place])   Abnormal LFTs   Injury of right facial nerve   Discharged Condition:  Stable   Significant Diagnostic Studies: Dg Tibia/fibula Left  Result Date: 11/29/2017 CLINICAL DATA:  Irrigation of right lower extremity wound for open tibial fracture. EXAM: RIGHT TIBIA AND FIBULA - 2 VIEW; DG C-ARM 61-120 MIN; LEFT TIBIA AND FIBULA - 2 VIEW FLUOROSCOPY TIME:  21 seconds. COMPARISON:  Radiographs of same day. FINDINGS: Four intraoperative fluoroscopic images were obtained of the right lower leg. These demonstrate severely comminuted and displaced fracture involving the midshaft of the right tibia with overlying soft tissue laceration or wound. Three intraoperative fluoroscopic images of the left tibia and fibula demonstrate mildly displaced and comminuted left fibular shaft fracture. IMPRESSION: Fluoroscopic guidance and evaluation was performed of bilateral fibular fractures. Electronically Signed   By: Marijo Conception, M.D.   On: 11/29/2017 16:21   Dg Tibia/fibula Right  Result Date: 11/29/2017 CLINICAL DATA:  Irrigation of right lower extremity wound for open tibial fracture. EXAM: RIGHT TIBIA AND FIBULA - 2 VIEW; DG C-ARM 61-120 MIN; LEFT TIBIA AND FIBULA - 2 VIEW FLUOROSCOPY TIME:  21 seconds. COMPARISON:  Radiographs of same day. FINDINGS: Four intraoperative fluoroscopic images were obtained of the right lower leg. These demonstrate severely comminuted and displaced fracture  involving the midshaft of the right tibia with overlying soft tissue laceration or wound. Three intraoperative fluoroscopic images of the left tibia and fibula demonstrate mildly displaced and comminuted left fibular shaft fracture. IMPRESSION: Fluoroscopic guidance and evaluation was performed of bilateral fibular fractures. Electronically Signed   By: Marijo Conception, M.D.   On: 11/29/2017 16:21   Dg Tibia/fibula Right  Result Date: 11/29/2017 CLINICAL DATA:  Patient struck by falling tree. Open wound of the right leg EXAM: RIGHT TIBIA AND FIBULA - 2 VIEW COMPARISON:  None. FINDINGS: Soft tissue wound along the posterolateral aspect of the right leg. Soft tissue emphysema around the wound. Comminuted fracture of the proximal-mid fibular diaphysis with 8 mm of distal displacement of the major fracture fragment. No other fracture or dislocation. IMPRESSION: 1. Open comminuted fracture of the proximal-mid fibular diaphysis. Electronically Signed   By: Kathreen Devoid   On: 11/29/2017 11:40   Ct Head Wo Contrast  Result Date: 11/29/2017 CLINICAL DATA:  Trauma with facial fracture suspected EXAM: CT HEAD WITHOUT CONTRAST CT MAXILLOFACIAL WITHOUT CONTRAST CT CERVICAL SPINE WITHOUT CONTRAST TECHNIQUE: Multidetector CT imaging of the head, cervical spine, and maxillofacial structures were performed using the standard protocol without intravenous contrast. Multiplanar CT image reconstructions of the cervical spine and maxillofacial structures were also generated. COMPARISON:  None. FINDINGS: CT HEAD FINDINGS Brain: No evidence of acute infarction, hemorrhage, hydrocephalus, extra-axial collection or mass lesion/mass effect. Vascular: Negative Skull: Facial fractures and skull base fractures described below. CT MAXILLOFACIAL FINDINGS Osseous: Nondisplaced right anterior mandible fracture extending between the canine and first premolar roots. No additional mandibular fracture is seen. No mandibular dislocation. Tripod  fracture on the right with anterior and posterior walls right  maxillary sinus, right orbital floor, lateral right orbit wall. The fracture complexes incomplete at the level of the zygomatic arch. There is mild depression of the right zygoma compared to the left. Right temporal bone fracture with hemotympanum. Fracture appears to spare the otic capsule. There is disruption of the malleoincudal joint. Central skull base fracture traversing the pterygoid bodies and sphenoid sinuses. Posterior nasal septum shows segmental fracturing. Best seen on coronal reformats there is fracture offset along the right foramen rotundum. No carotid canal involvement. Right orbital roof fracture.  Small volume pneumocephalus. Orbits: Soft tissue gas from sinus fracturing on the right where there is also mild extraconal stranding. No hematoma or worrisome proptosis. Sinuses: Patchy hemosinus. Soft tissues: Facial contusion.  No opaque foreign body. CT CERVICAL SPINE FINDINGS Alignment: No traumatic malalignment Skull base and vertebrae: Negative for fracture Soft tissues and spinal canal: No prevertebral fluid or swelling. No visible canal hematoma. Disc levels:  Negative Upper chest: Reported separately. IMPRESSION: Maxillofacial CT: 1. Nondisplaced right anterior mandible fracture. 2. Tripod fracture on the right that is incomplete at the zygomatic arch. 3. Central skull base fracturing involving the sphenoid sinuses. This fracture is displaced at the right foramen rotundum. 4. Right temporal bone fracture with malleo-incudal subluxation. Head CT: 1. Negative for intracranial hemorrhage or visible contusion. 2. Small volume pneumocephalus. Cervical spine CT: No evidence of injury. Electronically Signed   By: Monte Fantasia M.D.   On: 11/29/2017 12:39   Ct Chest W Contrast  Result Date: 11/29/2017 CLINICAL DATA:  Hit by tree, blunt trauma EXAM: CT CHEST, ABDOMEN, AND PELVIS WITH CONTRAST TECHNIQUE: Multidetector CT imaging of the  chest, abdomen and pelvis was performed following the standard protocol during bolus administration of intravenous contrast. CONTRAST:  158m OMNIPAQUE IOHEXOL 300 MG/ML  SOLN COMPARISON:  None. FINDINGS: CT CHEST FINDINGS Cardiovascular: Heart size normal. No pericardial effusion. No thoracic aortic aneurysm. Mediastinum/Nodes: No mediastinal hematoma. No hilar or mediastinal adenopathy. Endotracheal tube projects in expected location. Lungs/Pleura: No pneumothorax. No pleural effusion. Advanced bullous emphysema most marked in the apices. Consolidation/atelectasis posteriorly in both lungs. Musculoskeletal: Minimally displaced fracture, posterior aspect left eleventh rib. CT ABDOMEN PELVIS FINDINGS Hepatobiliary: 4.7 cm linear laceration in hepatic segment 7, extending to the capsule medially just posterior to the IVC. No definite active extravasation. Gallbladder unremarkable. Pancreas: Unremarkable. No pancreatic ductal dilatation or surrounding inflammatory changes. Spleen: 4.5 cm laceration in the cephalad aspect of the spine bleeding extending towards the posterior capsule without definite capsular injury. There is no perisplenic hematoma. No active extravasation evident. Adrenals/Urinary Tract: 2.8 cm right adrenal hemorrhage extending into the posterior retroperitoneum with a small amount of extravasation evident on delayed scan. Left adrenal unremarkable. Kidneys unremarkable. No hydronephrosis. Urinary bladder nondistended. Stomach/Bowel: Physiologic distention of stomach with fluid and gas. The small bowel is nondilated. Normal appendix. Colon decompressed, unremarkable. Vascular/Lymphatic: No significant vascular findings are present. No enlarged abdominal or pelvic lymph nodes. Reproductive: Prostate is unremarkable. Other: No ascites.  No free air. Musculoskeletal: Lumbar spine intact. Bony pelvis intact. Bilateral hips unremarkable. Comminuted sub trochanteric fracture of the left femoral shaft,  incompletely visualized. IMPRESSION: 1. 4.7 cm linear laceration hepatic segment 7 without extracapsular hematoma or active extravasation. 2. 2.8 cm right adrenal hemorrhage with some extravasation evident on delayed scan. 3. 4.5 cm splenic laceration without extracapsular hematoma or active extravasation. 4. Minimally displaced left eleventh rib fracture; no pneumothorax. 5. Comminuted proximal left femoral shaft subtrochanteric fracture, incompletely visualized. 6. Endotracheal tube in good position. 7.  Advanced pulmonary bullous emphysema. Critical Value/emergent results were reviewed in person at the time of interpretation on 11/29/2017 at 12:33 pm with Dr. Grandville Silos , who verbally acknowledged these results. Electronically Signed   By: Lucrezia Europe M.D.   On: 11/29/2017 12:34   Ct Cervical Spine Wo Contrast  Result Date: 11/29/2017 CLINICAL DATA:  Trauma with facial fracture suspected EXAM: CT HEAD WITHOUT CONTRAST CT MAXILLOFACIAL WITHOUT CONTRAST CT CERVICAL SPINE WITHOUT CONTRAST TECHNIQUE: Multidetector CT imaging of the head, cervical spine, and maxillofacial structures were performed using the standard protocol without intravenous contrast. Multiplanar CT image reconstructions of the cervical spine and maxillofacial structures were also generated. COMPARISON:  None. FINDINGS: CT HEAD FINDINGS Brain: No evidence of acute infarction, hemorrhage, hydrocephalus, extra-axial collection or mass lesion/mass effect. Vascular: Negative Skull: Facial fractures and skull base fractures described below. CT MAXILLOFACIAL FINDINGS Osseous: Nondisplaced right anterior mandible fracture extending between the canine and first premolar roots. No additional mandibular fracture is seen. No mandibular dislocation. Tripod fracture on the right with anterior and posterior walls right maxillary sinus, right orbital floor, lateral right orbit wall. The fracture complexes incomplete at the level of the zygomatic arch. There is  mild depression of the right zygoma compared to the left. Right temporal bone fracture with hemotympanum. Fracture appears to spare the otic capsule. There is disruption of the malleoincudal joint. Central skull base fracture traversing the pterygoid bodies and sphenoid sinuses. Posterior nasal septum shows segmental fracturing. Best seen on coronal reformats there is fracture offset along the right foramen rotundum. No carotid canal involvement. Right orbital roof fracture.  Small volume pneumocephalus. Orbits: Soft tissue gas from sinus fracturing on the right where there is also mild extraconal stranding. No hematoma or worrisome proptosis. Sinuses: Patchy hemosinus. Soft tissues: Facial contusion.  No opaque foreign body. CT CERVICAL SPINE FINDINGS Alignment: No traumatic malalignment Skull base and vertebrae: Negative for fracture Soft tissues and spinal canal: No prevertebral fluid or swelling. No visible canal hematoma. Disc levels:  Negative Upper chest: Reported separately. IMPRESSION: Maxillofacial CT: 1. Nondisplaced right anterior mandible fracture. 2. Tripod fracture on the right that is incomplete at the zygomatic arch. 3. Central skull base fracturing involving the sphenoid sinuses. This fracture is displaced at the right foramen rotundum. 4. Right temporal bone fracture with malleo-incudal subluxation. Head CT: 1. Negative for intracranial hemorrhage or visible contusion. 2. Small volume pneumocephalus. Cervical spine CT: No evidence of injury. Electronically Signed   By: Monte Fantasia M.D.   On: 11/29/2017 12:39   Ct Abdomen Pelvis W Contrast  Result Date: 11/29/2017 CLINICAL DATA:  Hit by tree, blunt trauma EXAM: CT CHEST, ABDOMEN, AND PELVIS WITH CONTRAST TECHNIQUE: Multidetector CT imaging of the chest, abdomen and pelvis was performed following the standard protocol during bolus administration of intravenous contrast. CONTRAST:  155m OMNIPAQUE IOHEXOL 300 MG/ML  SOLN COMPARISON:  None.  FINDINGS: CT CHEST FINDINGS Cardiovascular: Heart size normal. No pericardial effusion. No thoracic aortic aneurysm. Mediastinum/Nodes: No mediastinal hematoma. No hilar or mediastinal adenopathy. Endotracheal tube projects in expected location. Lungs/Pleura: No pneumothorax. No pleural effusion. Advanced bullous emphysema most marked in the apices. Consolidation/atelectasis posteriorly in both lungs. Musculoskeletal: Minimally displaced fracture, posterior aspect left eleventh rib. CT ABDOMEN PELVIS FINDINGS Hepatobiliary: 4.7 cm linear laceration in hepatic segment 7, extending to the capsule medially just posterior to the IVC. No definite active extravasation. Gallbladder unremarkable. Pancreas: Unremarkable. No pancreatic ductal dilatation or surrounding inflammatory changes. Spleen: 4.5 cm laceration in the cephalad aspect of the  spine bleeding extending towards the posterior capsule without definite capsular injury. There is no perisplenic hematoma. No active extravasation evident. Adrenals/Urinary Tract: 2.8 cm right adrenal hemorrhage extending into the posterior retroperitoneum with a small amount of extravasation evident on delayed scan. Left adrenal unremarkable. Kidneys unremarkable. No hydronephrosis. Urinary bladder nondistended. Stomach/Bowel: Physiologic distention of stomach with fluid and gas. The small bowel is nondilated. Normal appendix. Colon decompressed, unremarkable. Vascular/Lymphatic: No significant vascular findings are present. No enlarged abdominal or pelvic lymph nodes. Reproductive: Prostate is unremarkable. Other: No ascites.  No free air. Musculoskeletal: Lumbar spine intact. Bony pelvis intact. Bilateral hips unremarkable. Comminuted sub trochanteric fracture of the left femoral shaft, incompletely visualized. IMPRESSION: 1. 4.7 cm linear laceration hepatic segment 7 without extracapsular hematoma or active extravasation. 2. 2.8 cm right adrenal hemorrhage with some extravasation  evident on delayed scan. 3. 4.5 cm splenic laceration without extracapsular hematoma or active extravasation. 4. Minimally displaced left eleventh rib fracture; no pneumothorax. 5. Comminuted proximal left femoral shaft subtrochanteric fracture, incompletely visualized. 6. Endotracheal tube in good position. 7. Advanced pulmonary bullous emphysema. Critical Value/emergent results were reviewed in person at the time of interpretation on 11/29/2017 at 12:33 pm with Dr. Grandville Silos , who verbally acknowledged these results. Electronically Signed   By: Lucrezia Europe M.D.   On: 11/29/2017 12:34   Dg Pelvis Portable  Addendum Date: 11/29/2017   ADDENDUM REPORT: 11/29/2017 11:33 ADDENDUM: Study discussed by telephone with Dr. Davonna Belling. And additionally, the clinical data should state "42 year old MALE". Electronically Signed   By: Genevie Ann M.D.   On: 11/29/2017 11:33   Result Date: 11/29/2017 CLINICAL DATA:  42 year old male status post blunt trauma, struck by tree. EXAM: PORTABLE PELVIS 1-2 VIEWS COMPARISON:  None. FINDINGS: Portable AP view at 1054 hours. Highly comminuted fracture of the proximal left femur beginning at the proximal shaft just distal to the intertrochanteric segment. Displaced butterfly fragments. Proximal to the shaft the left femur appears intact. The left femoral head is normally located. Grossly intact proximal right femur. No pelvis fracture identified. Negative visible bowel gas pattern. IMPRESSION: 1. Partially visible highly comminuted left femoral shaft fracture. The proximal left femur to the intertrochanteric segment appears intact. 2. No superimposed pelvis fracture. Electronically Signed: By: Genevie Ann M.D. On: 11/29/2017 11:17   Dg Chest Port 1 View  Result Date: 12/03/2017 CLINICAL DATA:  Fever. EXAM: PORTABLE CHEST 1 VIEW COMPARISON:  12/01/2017 FINDINGS: Endotracheal tube has been removed and the patient now has a tracheostomy tube. Nasogastric tube has been removed.  Feeding tube extends into the abdomen. Again noted is bullous emphysema in the upper lungs. Persistent densities in the lower chest with slightly improved aeration. Probable left pleural fluid. Heart size is grossly normal for size. Right jugular central line in the upper SVC region and unchanged. IMPRESSION: 1. Interval placement of tracheostomy tube. Placement of feeding tube. 2. Persistent bibasilar chest disease with slightly improved aeration. Probable left pleural fluid. 3. Bullous emphysematous changes in the upper lungs. Electronically Signed   By: Markus Daft M.D.   On: 12/03/2017 13:14   Dg Chest Port 1 View  Result Date: 12/01/2017 CLINICAL DATA:  Endotracheal tube position EXAM: PORTABLE CHEST 1 VIEW COMPARISON:  11/30/2017 FINDINGS: Endotracheal tube 6 cm above the carina unchanged. Central line in the proximal SVC unchanged. NG tube in the stomach with side hole in the distal esophagus. Right upper lobe emphysema. Progression of bibasilar airspace disease and small effusions IMPRESSION: Progression of bibasilar atelectasis/pneumonia and  small effusions. NG tip in the stomach with the side hole distal esophagus. Recommend advancing NG tube Electronically Signed   By: Franchot Gallo M.D.   On: 12/01/2017 09:30   Dg Chest Port 1 View  Result Date: 11/30/2017 CLINICAL DATA:  Left rib fracture. EXAM: PORTABLE CHEST 1 VIEW COMPARISON:  Radiographs and CT scan of November 29, 2017. FINDINGS: The heart size and mediastinal contours are within normal limits. Endotracheal tube is noted with distal tip 7 cm above the carina. Nasogastric tube is seen in expected position of esophagus with side hole above gastroesophageal junction. Severe emphysematous disease is noted in both upper lobes, right greater than left. Interval placement of right internal jugular catheter with distal tip in expected position of the SVC. Mild bibasilar subsegmental atelectasis is noted with probable small pleural effusions. The  visualized skeletal structures are unremarkable. IMPRESSION: Endotracheal tube in grossly good position. Interval placement of nasogastric tube with side hole above gastroesophageal junction. Right internal jugular catheter in grossly good position. Interval development of bibasilar subsegmental atelectasis or small pleural effusions. Severe emphysematous disease is noted in both upper lobes. Electronically Signed   By: Marijo Conception, M.D.   On: 11/30/2017 10:11   Dg Chest Port 1 View  Addendum Date: 11/29/2017   ADDENDUM REPORT: 11/29/2017 11:32 ADDENDUM: Study discussed by telephone with Dr. Davonna Belling on 11/29/2017 at 1125 hours. We favor bullous emphysema in the right upper lung, he reports the patient described a history of significant right lung emphysema. Chest CT is pending at this time. The Additionally, the clinical data should state "42 year old MALE". Electronically Signed   By: Genevie Ann M.D.   On: 11/29/2017 11:32   Result Date: 11/29/2017 CLINICAL DATA:  42 year old male status post blunt trauma, struck by tree. Chest pain. EXAM: PORTABLE CHEST 1 VIEW COMPARISON:  None. FINDINGS: Portable AP supine views are provided at both 1054 and 1111 hours. Suspicion of paraseptal emphysema in both lungs. There is a lack of lung markings in the right apex which could reflect a giant bulla or pneumothorax. There is associated vague right infrahilar opacity suggesting atelectasis. No definite pneumothorax on the left. No pleural effusion identified. Mediastinal contours remain within normal limits. On the 2nd film at 1111 hours and endotracheal tube has been placed with tip at the level of clavicles. No acute osseous abnormality identified. IMPRESSION: 1. Intubated on the 2nd film at 1111 hours with endotracheal tube tip at the level the clavicles. 2. Emphysema suspected with large Bulla versus Pneumothorax in the right apex. Mild right lung atelectasis suspected. 3. No other acute traumatic injury  identified. Electronically Signed: By: Genevie Ann M.D. On: 11/29/2017 11:20   Dg Cerv Spine Flex&ext Only  Result Date: 12/09/2017 CLINICAL DATA:  Neck pain and stiffness after whiplash injury EXAM: CERVICAL SPINE - FLEXION AND EXTENSION VIEWS ONLY COMPARISON:  CT cervical spine 11/29/2017 FINDINGS: Limited extension demonstrated. Reversal of cervical lordosis on extension images question muscle spasm. Prevertebral soft tissues normal thickness. Normal alignment. No abnormal motion with flexion or extension identified. No neutral view. IMPRESSION: Question muscle spasm, with reversal of cervical lordosis on extension view, little extension demonstrated. No cervical spine malalignment identified. Electronically Signed   By: Lavonia Dana M.D.   On: 12/09/2017 09:28   Dg Tibia/fibula Left Port  Result Date: 11/29/2017 CLINICAL DATA:  Lower leg fracture EXAM: PORTABLE LEFT TIBIA AND FIBULA - 2 VIEW COMPARISON:  11/29/2017 FINDINGS: Acute comminuted fracture involving the midshaft of the fibula  with close to 1/2 bone with of medial and less than 1/4 bone with of posterior displacement of main fracture fragment. Several small displaced fragments are noted at the site of fracture. Soft tissue swelling is evident. IMPRESSION: Acute comminuted, mildly displaced fracture involving the midshaft of the fibula Electronically Signed   By: Donavan Foil M.D.   On: 11/29/2017 19:25   Dg Tibia/fibula Right Port  Result Date: 11/29/2017 CLINICAL DATA:  Lower leg fracture EXAM: PORTABLE RIGHT TIBIA AND FIBULA - 2 VIEW COMPARISON:  11/29/2017 FINDINGS: Acute markedly comminuted fracture involving the midshaft of the fibula with multiple displaced bone fragments at the fracture site. Interim placement of a drain at the fracture. Decreased soft tissue gas compared to prior. IMPRESSION: Acute comminuted and displaced fracture involving the mid fibular shaft with interim placement of a drain and interval decrease in soft tissue  emphysema. Electronically Signed   By: Donavan Foil M.D.   On: 11/29/2017 19:23   Dg Ankle Left Port  Result Date: 11/29/2017 CLINICAL DATA:  Left ankle pain and swelling EXAM: PORTABLE LEFT ANKLE - 2 VIEW COMPARISON:  None. FINDINGS: There is no evidence of fracture, dislocation, or joint effusion. There is no evidence of arthropathy or other focal bone abnormality. Soft tissues are unremarkable. IMPRESSION: Negative. Electronically Signed   By: Kathreen Devoid   On: 11/29/2017 11:38   Dg Abd Portable 1v  Result Date: 12/07/2017 CLINICAL DATA:  Feeding tube placement. EXAM: PORTABLE ABDOMEN - 1 VIEW COMPARISON:  12/02/2017. FINDINGS: Feeding tube again noted with tip projected over the upper stomach. Heart size normal. Bibasilar atelectasis and infiltrates. Small left pleural effusion. IMPRESSION: Feeding tube again noted with tip projected over the upper stomach. 2. Bibasilar atelectasis and infiltrates. Small left pleural effusion. Electronically Signed   By: Marcello Moores  Register   On: 12/07/2017 10:57   Dg Abd Portable 1v  Result Date: 12/02/2017 CLINICAL DATA:  Feeding tube placement EXAM: PORTABLE ABDOMEN - 1 VIEW COMPARISON:  the previous day's study FINDINGS: Weighted tip feeding tube has been advanced into the gastric body. Normal bowel gas pattern. Visualized bones unremarkable. IMPRESSION: Feeding tube to the gastric body. Electronically Signed   By: Lucrezia Europe M.D.   On: 12/02/2017 11:05   Dg C-arm 1-60 Min  Result Date: 11/29/2017 CLINICAL DATA:  ORIF. EXAM: LEFT FEMUR 2 VIEWS; DG C-ARM 61-120 MIN COMPARISON:  Same day FINDINGS: Multiple C-arm images show an open comminuted fracture of the mid shaft of the fibula on the right. Multiple images on the left show a mildly comminuted an apparently nondisplaced fracture of the mid shaft of the fibula. Subsequent images show gamma nail fixation of a comminuted femur fracture on the left with distal locking screws. IMPRESSION: Multiple lower  extremity C-arm images as above. Electronically Signed   By: Nelson Chimes M.D.   On: 11/29/2017 16:45   Dg C-arm 1-60 Min  Result Date: 11/29/2017 CLINICAL DATA:  Irrigation of right lower extremity wound for open tibial fracture. EXAM: RIGHT TIBIA AND FIBULA - 2 VIEW; DG C-ARM 61-120 MIN; LEFT TIBIA AND FIBULA - 2 VIEW FLUOROSCOPY TIME:  21 seconds. COMPARISON:  Radiographs of same day. FINDINGS: Four intraoperative fluoroscopic images were obtained of the right lower leg. These demonstrate severely comminuted and displaced fracture involving the midshaft of the right tibia with overlying soft tissue laceration or wound. Three intraoperative fluoroscopic images of the left tibia and fibula demonstrate mildly displaced and comminuted left fibular shaft fracture. IMPRESSION: Fluoroscopic guidance and  evaluation was performed of bilateral fibular fractures. Electronically Signed   By: Marijo Conception, M.D.   On: 11/29/2017 16:21   Dg Femur Portable 1 View Left  Result Date: 11/29/2017 CLINICAL DATA:  Hit by falling tree. EXAM: LEFT FEMUR PORTABLE 1 VIEW COMPARISON:  None. FINDINGS: There is a comminuted displaced fracture through the proximal to midshaft of the left femur. The distal fragment is displaced greater than 1 shaft with laterally. No visible hip dislocation or subluxation. IMPRESSION: Comminuted, displaced proximal to mid left femoral fracture. Electronically Signed   By: Rolm Baptise M.D.   On: 11/29/2017 11:40   Dg Femur Min 2 Views Left  Result Date: 11/29/2017 CLINICAL DATA:  ORIF. EXAM: LEFT FEMUR 2 VIEWS; DG C-ARM 61-120 MIN COMPARISON:  Same day FINDINGS: Multiple C-arm images show an open comminuted fracture of the mid shaft of the fibula on the right. Multiple images on the left show a mildly comminuted an apparently nondisplaced fracture of the mid shaft of the fibula. Subsequent images show gamma nail fixation of a comminuted femur fracture on the left with distal locking screws.  IMPRESSION: Multiple lower extremity C-arm images as above. Electronically Signed   By: Nelson Chimes M.D.   On: 11/29/2017 16:45   Dg Femur Port Min 2 Views Left  Result Date: 11/29/2017 CLINICAL DATA:  Postop EXAM: LEFT FEMUR PORTABLE 2 VIEWS COMPARISON:  11/29/2017 FINDINGS: Interval intramedullary rod and screw fixation of the left femur for comminuted fracture involving the proximal shaft of the femur with decreased displacement of fracture segments. Hardware appears intact. Small amount of gas in the distal soft tissues. IMPRESSION: Interval intramedullary rod and screw fixation of the comminuted proximal femoral fracture with interval decreased displacement of fracture fragments. Electronically Signed   By: Donavan Foil M.D.   On: 11/29/2017 19:28   Vas [place] Lower Extremity Venous (dvt)  Result Date: 12/23/2017  Lower Venous Study Indications: Swelling.  Performing Technologist: Oliver Hum RVT  Examination Guidelines: A complete evaluation includes B-mode imaging, spectral Doppler, color Doppler, and power Doppler as needed of all accessible portions of each vessel. Bilateral testing is considered an integral part of a complete examination. Limited examinations for reoccurring indications may be performed as noted.  Right Venous Findings: +---+---------------+---------+-----------+----------+-------+    CompressibilityPhasicitySpontaneityPropertiesSummary +---+---------------+---------+-----------+----------+-------+ CFVFull           Yes      Yes                          +---+---------------+---------+-----------+----------+-------+  Left Venous Findings: +---------+---------------+---------+-----------+----------+-------+          CompressibilityPhasicitySpontaneityPropertiesSummary +---------+---------------+---------+-----------+----------+-------+ CFV      Full           Yes      Yes                           +---------+---------------+---------+-----------+----------+-------+ SFJ      Full                                                 +---------+---------------+---------+-----------+----------+-------+ FV Prox  Full                                                 +---------+---------------+---------+-----------+----------+-------+  FV Mid   Full                                                 +---------+---------------+---------+-----------+----------+-------+ FV DistalFull                                                 +---------+---------------+---------+-----------+----------+-------+ PFV      Full                                                 +---------+---------------+---------+-----------+----------+-------+ POP      Full           Yes      Yes                          +---------+---------------+---------+-----------+----------+-------+ PTV      Full                                                 +---------+---------------+---------+-----------+----------+-------+ PERO     Full                                                 +---------+---------------+---------+-----------+----------+-------+    Summary: Right: No evidence of common femoral vein obstruction. Left: There is no evidence of deep vein thrombosis in the lower extremity. No cystic structure found in the popliteal fossa.  *See table(s) above for measurements and observations. Electronically signed by Servando Snare MD on 12/23/2017 at 8:28:55 AM.    Final    Vas [place] Lower Extremity Venous (dvt)  Result Date: 12/15/2017  Lower Venous Study Indications: Pain.  Limitations: Immobility. Performing Technologist: Oliver Hum RVT  Examination Guidelines: A complete evaluation includes B-mode imaging, spectral Doppler, color Doppler, and power Doppler as needed of all accessible portions of each vessel. Bilateral testing is considered an integral part of a complete examination. Limited  examinations for reoccurring indications may be performed as noted.  Right Venous Findings: +---------+---------------+---------+-----------+----------+-------+          CompressibilityPhasicitySpontaneityPropertiesSummary +---------+---------------+---------+-----------+----------+-------+ CFV      Full           Yes      Yes                          +---------+---------------+---------+-----------+----------+-------+ SFJ      Full                                                 +---------+---------------+---------+-----------+----------+-------+ FV Prox  Full                                                 +---------+---------------+---------+-----------+----------+-------+  FV Mid   Full                                                 +---------+---------------+---------+-----------+----------+-------+ FV DistalFull                                                 +---------+---------------+---------+-----------+----------+-------+ PFV      Full                                                 +---------+---------------+---------+-----------+----------+-------+ POP      Full           Yes      Yes                          +---------+---------------+---------+-----------+----------+-------+ PTV      Full                                                 +---------+---------------+---------+-----------+----------+-------+ PERO     Full                                                 +---------+---------------+---------+-----------+----------+-------+  Left Venous Findings: +---------+---------------+---------+-----------+----------+-------+          CompressibilityPhasicitySpontaneityPropertiesSummary +---------+---------------+---------+-----------+----------+-------+ CFV      Full           Yes      Yes                          +---------+---------------+---------+-----------+----------+-------+ SFJ      Full                                                  +---------+---------------+---------+-----------+----------+-------+ FV Prox  Full                                                 +---------+---------------+---------+-----------+----------+-------+ FV Mid   Full                                                 +---------+---------------+---------+-----------+----------+-------+ FV DistalFull                                                 +---------+---------------+---------+-----------+----------+-------+  PFV      Full                                                 +---------+---------------+---------+-----------+----------+-------+ POP      Full           Yes      Yes                          +---------+---------------+---------+-----------+----------+-------+ PTV      Full                                                 +---------+---------------+---------+-----------+----------+-------+ PERO     Full                                                 +---------+---------------+---------+-----------+----------+-------+    Summary: Right: There is no evidence of deep vein thrombosis in the lower extremity. No cystic structure found in the popliteal fossa. Left: There is no evidence of deep vein thrombosis in the lower extremity. No cystic structure found in the popliteal fossa.  *See table(s) above for measurements and observations. Electronically signed by Monica Martinez MD on 12/15/2017 at 6:07:38 PM.    Final    Ct Maxillofacial Wo Contrast  Result Date: 11/29/2017 CLINICAL DATA:  Trauma with facial fracture suspected EXAM: CT HEAD WITHOUT CONTRAST CT MAXILLOFACIAL WITHOUT CONTRAST CT CERVICAL SPINE WITHOUT CONTRAST TECHNIQUE: Multidetector CT imaging of the head, cervical spine, and maxillofacial structures were performed using the standard protocol without intravenous contrast. Multiplanar CT image reconstructions of the cervical spine and maxillofacial structures were also  generated. COMPARISON:  None. FINDINGS: CT HEAD FINDINGS Brain: No evidence of acute infarction, hemorrhage, hydrocephalus, extra-axial collection or mass lesion/mass effect. Vascular: Negative Skull: Facial fractures and skull base fractures described below. CT MAXILLOFACIAL FINDINGS Osseous: Nondisplaced right anterior mandible fracture extending between the canine and first premolar roots. No additional mandibular fracture is seen. No mandibular dislocation. Tripod fracture on the right with anterior and posterior walls right maxillary sinus, right orbital floor, lateral right orbit wall. The fracture complexes incomplete at the level of the zygomatic arch. There is mild depression of the right zygoma compared to the left. Right temporal bone fracture with hemotympanum. Fracture appears to spare the otic capsule. There is disruption of the malleoincudal joint. Central skull base fracture traversing the pterygoid bodies and sphenoid sinuses. Posterior nasal septum shows segmental fracturing. Best seen on coronal reformats there is fracture offset along the right foramen rotundum. No carotid canal involvement. Right orbital roof fracture.  Small volume pneumocephalus. Orbits: Soft tissue gas from sinus fracturing on the right where there is also mild extraconal stranding. No hematoma or worrisome proptosis. Sinuses: Patchy hemosinus. Soft tissues: Facial contusion.  No opaque foreign body. CT CERVICAL SPINE FINDINGS Alignment: No traumatic malalignment Skull base and vertebrae: Negative for fracture Soft tissues and spinal canal: No prevertebral fluid or swelling. No visible canal hematoma. Disc levels:  Negative Upper chest: Reported separately. IMPRESSION: Maxillofacial CT: 1. Nondisplaced right anterior mandible fracture. 2.  Tripod fracture on the right that is incomplete at the zygomatic arch. 3. Central skull base fracturing involving the sphenoid sinuses. This fracture is displaced at the right foramen  rotundum. 4. Right temporal bone fracture with malleo-incudal subluxation. Head CT: 1. Negative for intracranial hemorrhage or visible contusion. 2. Small volume pneumocephalus. Cervical spine CT: No evidence of injury. Electronically Signed   By: Monte Fantasia M.D.   On: 11/29/2017 12:39   Ct Temporal Bones Wo Contrast  Result Date: 12/02/2017 CLINICAL DATA:  42 y/o M; right orbit, maxillary, and mastoid fractures. EXAM: CT TEMPORAL BONES WITHOUT CONTRAST TECHNIQUE: Axial and coronal plane CT imaging of the petrous temporal bones was performed with thin-collimation image reconstruction. No intravenous contrast was administered. Multiplanar CT image reconstructions were also generated. COMPARISON:  11/29/2017 CT maxillofacial. FINDINGS: Right ear: Acute longitudinal fracture of the right temporal bone traversing mastoid air cells (series 3, image 60), tegmen mastoideum (series 4, image 159), tegmen tympani (series 4, image 153), posteromedial wall of glenoid fossa (series 4, image 135 and series 3, image 46), lateral wall of carotid canal (series 4, image 146), and contiguous with right zygomatic complex and pterygoid plate fractures. Normal malleus incus articulation. Incudostapedial joint dislocation with the tip of the long process of the incus superiorly directed through the tegmen tympani fracture into the intracranial compartment (series 7, image 167). The manubrium of the malleolus extends to its expected location at the tympanic membrane (series 7, image 164). The stapes is probably orthotopic but incompletely visualized (series 7, image 166). Opacification of the right mastoid air cells and the right middle ear cavity with fluid levels. Inner ear structures are intact. Left ear: Normal external auditory canal. Normal tympanic membrane. No blunting of the scutum. The ossicles are intact without gross erosion. Normal course of the facial nerve. Normal semicircular canals and vestibulocochlear  apparatus. Trace opacification of left mastoid air cells. Other: Acute right zygomatic complex fracture involving anterior posterior walls of the maxillary sinus, floor right orbit, with propagation into the pterygoid plate. The pterygoid plate fracture is contiguous with the longitudinal fracture of the right temporal bone. Nondisplaced fracture of the right greater sphenoid wing. Blood fluid levels in the sinuses. IMPRESSION: Right ear: 1. Acute longitudinal fracture of the right temporal bone traversing mastoid air cells, tegmen mastoideum, tegmen tympani, posteromedial wall of glenoid fossa, and lateral wall of the carotid canal. The fracture is contiguous with skull base, right zygomatic complex, and right pterygoid plate fractures of the face. 2. Incudostapedial joint dislocation with the tip of the long process of the incus superiorly directed through the tegmen tympani fracture into the intracranial compartment. 3. Stapes is probably orthotopic but incompletely visualized. 4. Malleus appears orthotopic. 5. Inner ear structures are intact. Left ear: Trace mastoid opacification. No fracture or ossicular dislocation identified. Electronically Signed   By: Kristine Garbe M.D.   On: 12/02/2017 05:18    Labs:  Basic Metabolic Panel: BMP Latest Ref Rng & Units 12/19/2017 12/14/2017 12/10/2017  Glucose 70 - 99 mg/dL 93 126(H) 83  BUN 6 - 20 mg/dL 15 21(H) 22(H)  Creatinine 0.61 - 1.24 mg/dL 0.73 0.76 0.65  Sodium 135 - 145 mmol/L 134(L) 135 136  Potassium 3.5 - 5.1 mmol/L 4.6 4.0 4.9  Chloride 98 - 111 mmol/L 104 101 102  CO2 22 - 32 mmol/L _0 Calcium 8.9 - 10.3 mg/dL 8.3(L) 8.3(L) 8.3(L)    CBC: CBC Latest Ref Rng & Units 12/19/2017 12/14/2017 12/13/2017  WBC  4.0 - 10.5 K/uL 9.1 12.5(H) 14.3(H)  Hemoglobin 13.0 - 17.0 g/dL 9.0(L) 9.7(L) 10.1(L)  Hematocrit 39.0 - 52.0 % 30.3(L) 32.1(L) 32.5(L)  Platelets 150 - 400 K/uL 576(H) 709(H) 696(H)    Hepatic Function Latest Ref Rng &  Units 12/14/2017 12/02/2017 11/29/2017  Total Protein 6.5 - 8.1 g/dL 6.5 - 5.8(L)  Albumin 3.5 - 5.0 g/dL 2.5(L) 2.3(L) 3.3(L)  AST 15 - 41 U/L 23 - 118(H)  ALT 0 - 44 U/L 35 - 112(H)  Alk Phosphatase 38 - 126 U/L 167(H) - 75  Total Bilirubin 0.3 - 1.2 mg/dL 0.6 - 0.5    CBG: No results for input(s): GLUCAP in the last 168 hours.  Brief HPI:   Willie Blevins is a 42 year old male with history of emphysema who was admitted on 11/29/2017 after being struck by a tree at work.  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  He was intubated in ED and taken to the OR for repair of laceration as well as IM nailing left femoral shaft and I & D open fibular fracture with placement of wound VAC by Dr. Marcelino Scot. He was taken back to the OR on 10/17 for ORIF mandibular fracture with tracheostomy by Dr. Barry Dienes as well as dressing change with I&D and excision of muscle and fat right lower extremity but by Dr. Marcelino Scot.  Postop to be P WB LLE and WBAT on RLE with Cam boot.  Aggressive range of motion BLE recommended with 30-day DVT prophylaxis with use of Lovenox.   Rhabdomyolysis due to crush injuries were treated with IV fluids for hydration.  Postop he developed fevers due to aspiration pneumonia and was started on IV antibiotics.  He was taken back to the OR on 10/21 for right canal tympanomastoidectomy with exploration and partial decompression of right facial nerve, removal of fracture/dislocated incus from middle fossa and repair of CSF leak by Dr. Thornell Mule.  He was extubated to ATC on 10/22 and was tolerating button  plugging.  Psychiatry consulted for input due to issues with anxiety and flashbacks as well as nightmares.  Prazosin was added to help with PTSD symptoms with recommendations to add Lexapro if latter ineffective.  He was started on full liquid diet however has had easy fatigability therefore cortak replaced to help with nutritional support.  Postauricular sutures removed on 1029 however he is noted to have suture abscess therefore Dr. Thornell Mule recommended continuing Rocephin x3 total weeks.  Noted to have delayed processing with intermittent bouts of agitation, has deficits in high level functional tasks as well as significant deficits in mobility as well as ability to carry out ADL tasks.  CIR was recommended due to polytrauma with significant deficits   Hospital Course: Antwaun R San Blevins was admitted to rehab 12/13/2017 for inpatient therapies to consist of PT, ST and OT at least three hours five days a week. Past admission physiatrist, therapy team and rehab RN have worked together to provide customized collaborative inpatient rehab.  His respiratory status has been stable and he tolerated plugging without difficulty.  He was started on tube feeds at night for additional nutritional support.  His respiratory status was stable  he was decannulated by 10/31 and MMT wires were cut by Dr. Janace Hoard.  He has been educated regarding applying pressure to prior stoma site to help promote closure.  His diet has been advanced to dysphagia 2, thin liquids and he is tolerating this without difficulty.  He is able to maintain safe swallow strategies independently.  Protein supplements were added to help with wound healing.   His mood has been stable and team continued to provide ego support during the stay. He completed 3-week course of Rocephin on 11/9 for treatment of aspiration pneumonia and right postauricular abscess.  Follow-up be made shows mild hyponatremia.  Prerenal azotemia has resolved with increase in fluid intake.   Serial CBC shows that reactive leukocytosis has resolved and acute blood loss anemia stable.  Abnormal LFTs are resolving.  He was advanced to weightbearing as tolerated bilateral lower extremity per input from Ortho.  Bilateral lower extremity Dopplers done past admission and were negative for DVT.  He was fitted with a cam boot bilateral lower extremity to help with stabilization gait quality.  Right calf laceration is healing well without signs or symptoms of infection and sutures were DC'd without difficulty.  Multiple abrasions are in different stages of healing but no signs of infection noted.  Generalized pain has been controlled with as needed use of oxycodone.    Right facial weakness continues with decrease lid closure.  He has been educated on using eyedrops throughout the day as well as patching eye at nighttime to avoid corneal irritation/abrasion. He did develop increased edema left lateral calf and lower extremity Dopplers were repeated on 11/7.  This was negative for DVT and edema did resolve with elevation overnight. Ortho was consulted for input and recommended using teds for edema control.  He is to continue on subcu Lovenox for 1 additional weeks to complete 4 weeks of DVT prophylaxis.  He has been educated about importance of oral care after meals and at least 4 times daily basis.  He has made steady progress and is admitting assist level at discharge.  He will continue to receive further follow-up [facility] PT, OT, ST and RN to be arranged by [employer]    Rehab course: During patient's stay in rehab weekly team conferences were held to monitor patient's progress, set goals and discuss barriers to discharge. At admission, patient required mod assist with mobility and basic self-care task.  He exhibited mild dysarthria with mild high-level cognitive deficits affecting attention and short-term recall.  He was tolerating dysphagia 1 diet without overt signs or symptoms of aspiration.   He  has had improvement in activity tolerance, balance, postural control as well as ability to compensate for deficits.  He is able to complete ADL tasks with supervision. He requires supervision for supine to sitting at edge of bed, min assist for sit to supine and is able to ambulate 25 to 50 feet with rolling walker and contact-guard assist.  Speech therapy signed off on 11/7 as patient was modified independent for cognitive task as well as for speech clarity and was able to maintain safe swallow strategies.  Family education was completed regarding all aspects of safety, mobility, wound care as well as HEP.   Disposition: Home  Diet: Dysphagia 2 with supervision for safety.   Wound Care: Cleanse with soap and water. Pat dry. Apply oil emulsion dressing on left calf and cover with guaze. Keep wound clean and dry. Apply ace wrap on LLE for edema control.  Special Instructions: 1.  Continue oral care 4 times daily. 2.  No strenuous activity.  Wear PRAFO in bed and CAM boots when out of bed 3. Tape or patch right eye at nights after apply a layer of Lacrilube.    Discharge Instructions    Ambulatory referral to Physical Medicine Rehab   Complete by:  As directed    1-2 weeks transitional care appt     Allergies as of 12/24/2017      Reactions   Penicillins Hives   Has patient had a PCN reaction causing immediate rash, facial/tongue/throat swelling, SOB or lightheadedness with hypotension: Yes Has patient had a PCN reaction causing severe rash involving mucus membranes or skin necrosis: No Has patient had a PCN reaction that required hospitalization: No Has patient had a PCN reaction occurring within the last 10 years: No If all of the above answers are "NO", then may proceed with Cephalosporin use.      Medication List    TAKE these medications   acetaminophen 500 MG tablet Commonly known as:  TYLENOL Take 1-2 tablets (500-1,000 mg total) by mouth every 8 (eight) hours.    artificial tears Oint ophthalmic ointment Commonly known as:  LACRILUBE Place into the right eye at bedtime and may repeat dose one time if needed.   ascorbic acid 1000 MG tablet Commonly known as:  VITAMIN C Take 1 tablet (1,000 mg total) by mouth daily.   benzocaine 10 % mucosal gel Commonly known as:  ORAJEL Use as directed in the mouth or throat 4 (four) times daily as needed for mouth pain (put a bedside).   enoxaparin 40 MG/0.4ML injection Commonly known as:  LOVENOX Inject 0.4 mLs (40 mg total) into the skin daily.   famotidine 20 MG tablet Commonly known as:  PEPCID Take 1 tablet (20 mg total) by mouth 2 (two) times daily.   gabapentin 600 MG tablet Commonly known as:  NEURONTIN Take 1 tablet (600 mg total) by mouth every 8 (eight) hours.   hydrOXYzine 25 MG tablet Commonly known as:  ATARAX/VISTARIL Take 1 tablet (25 mg total) by mouth 3 (three) times daily as needed for anxiety.   lactobacillus Pack Take 1 packet (1 g total) by mouth 3 (three) times daily with meals. Notes to patient:  Probiotic--can change to as needed as bowels gets regular   lidocaine 5 % Commonly known as:  LIDODERM Apply 2 patches to ribs at 7 am and remove at 7 pm Notes to patient:  Left flank   methocarbamol 750 MG tablet Commonly known as:  ROBAXIN Take 1 tablet (750 mg total) by mouth 3 (three) times daily. Notes to patient:  Can change to as needed   mouth rinse Liqd solution 15 mLs by Mouth Rinse route every 4 (four) hours.   oxyCODONE 5 MG immediate release tablet--Rx # 56 pills.  Commonly known as:  Oxy IR/ROXICODONE Take 1-2 tablets (5-10 mg total) by mouth every 6 (six) hours as needed for severe pain.   polycarbophil 625 MG tablet Commonly known as:  FIBERCON Take 1 tablet (625 mg total) by mouth 2 (two) times daily.   polyvinyl alcohol 1.4 % ophthalmic solution Commonly known as:  LIQUIFILM TEARS Place 2 drops into the right eye every 4 (four) hours.   prazosin 1  MG capsule Commonly known as:  MINIPRESS Take 1 capsule (1 mg total) by mouth at bedtime.   traZODone 50 MG tablet Commonly known as:  DESYREL Take 0.5-1 tablets (25-50  mg total) by mouth at bedtime as needed for sleep.      Follow-up Information    Meredith Staggers, MD Follow up.   Specialty:  Physical Medicine and Rehabilitation Why:  Office will call you with follow up appointment Contact information: [address] [place] [address] [phone]        Melissa Montane, MD Follow up.   Specialty:  Otolaryngology Why:  call for appointment--for jaw wires as well as hearing exam Contact information: [address] [place] [address] [phone]        Altamese , MD Follow up.   Specialty:  Orthopedic Surgery Why:  for post of check on leg injuries Contact information: [place] [address] [phone]        Vicie Mutters, MD Follow up.   Specialty:  Otolaryngology Contact information: [place], [place] [address] [phone]        Gevena Cotton, MD Follow up.   Specialty:  Ophthalmology Why:  call for eye exam Contact information: [place] [address] [phone]        Bettey Mare, MD .   Specialty:  [facility] Medicine Contact information: [address] [facility] [place] [address] [phone]           Signed: Bary Leriche 12/27/2017, 4:03 PM

## 2017-12-23 NOTE — Discharge Instructions (Signed)
Inpatient Rehab Discharge Instructions  Willie Blevins Discharge date and time:  12/24/17  Activities/Precautions/ Functional Status: Activity: no lifting, driving, or strenuous exercise till cleared by MD Diet: dysphagia 2, chopped. Supervision at meals for safety.  Wound Care: Cleanse with soap and water. Pat dry. Apply oil emulsion dressing on left calf and cover with guaze. Keep wound clean and dry. Apply ace wrap on LLE for edema control.   Functional status:  ___ No restrictions     ___ Walk up steps independently _X__ 24/7 supervision/assistance   ___ Walk up steps with assistance ___ Intermittent supervision/assistance  ___ Bathe/dress independently ___ Walk with walker     ___ Bathe/dress with assistance ___ Walk Independently    ___ Shower independently ___ Walk with assistance    ___ Shower with assistance _X__ No alcohol     ___ Return to work/school ________    COMMUNITY REFERRALS UPON DISCHARGE:  Home Health:   PT     OT     ST    RN                      Agency:  Bring arranged by [employer]   Medical Equipment/Items Ordered: wheelchair, cushion, rolling walker, tub bench, suction machine                                                     Agency/Supplier:    Bring arranged by [employer]      Special Instructions: 1. Use PRAFO when in bed and CAM boots when up.  2. Tape or patch right eye at nights after apply a layer of Lacrilube.    My questions have been answered and I understand these instructions. I will adhere to these goals and the provided educational materials after my discharge from the hospital.  Patient/Caregiver Signature _______________________________ Date __________  Clinician Signature _______________________________________ Date __________  Please bring this form and your medication list with you to all your follow-up doctor's appointments.

## 2017-12-23 NOTE — Progress Notes (Signed)
Social Work Patient ID: Willie Blevins, male   DOB: 1975-07-29, 42 y.o.   MRN: [id number]   Have reviewed team conference with pt and family. Both aware and agreeable with targeted d/c date of 11/9 and goals of minimal assistance overall.  Coordinating HH and DME with Worker's Comp CM and pt/wife aware.  Terrace Chiem, LCSW

## 2017-12-24 NOTE — Progress Notes (Signed)
Patient discharged by staff via w/c with spouse by side. Voices understanding of discharge instructions.

## 2017-12-26 NOTE — Progress Notes (Signed)
Social Work  Discharge Note  The overall goal for the admission was met for:   Discharge location: Yes - home with wife who can provide 24/7 assistance  Length of Stay: Yes -   Discharge activity level: Yes - min assist overall  Home/community participation: Yes  Services provided included: MD, RD, PT, OT, SLP, RN, TR, Pharmacy and SW  Financial Services: Worker's Comp  Follow-up services arranged: Home Health: RN, PT, OT, ST arranged by [employer]  , DME: 18x18 lightweight w/c with ELRs, cushion, rolling walker, 3n1 commode and tub bench via [place] and Patient/Family has no preference for HH/DME agencies  Comments (or additional information):  Patient/Family verbalized understanding of follow-up arrangements: Yes  Individual responsible for coordination of the follow-up plan: pt  Confirmed correct DME delivered: Soua Caltagirone 12/26/2017    Tarrin Lebow

## 2017-12-27 DIAGNOSIS — R7989 Other specified abnormal findings of blood chemistry: Secondary | ICD-10-CM

## 2017-12-27 DIAGNOSIS — R945 Abnormal results of liver function studies: Secondary | ICD-10-CM

## 2017-12-27 DIAGNOSIS — S02609A Fracture of mandible, unspecified, initial encounter for closed fracture: Secondary | ICD-10-CM

## 2017-12-27 DIAGNOSIS — S0451XA Injury of facial nerve, right side, initial encounter: Secondary | ICD-10-CM

## 2017-12-27 DIAGNOSIS — T796XXA Traumatic ischemia of muscle, initial encounter: Secondary | ICD-10-CM

## 2017-12-28 ENCOUNTER — Other Ambulatory Visit: Payer: Self-pay

## 2017-12-28 ENCOUNTER — Encounter: Payer: Self-pay | Admitting: Physical Medicine & Rehabilitation

## 2017-12-28 ENCOUNTER — Encounter
Payer: Worker's Compensation | Attending: Physical Medicine & Rehabilitation | Admitting: Physical Medicine & Rehabilitation

## 2017-12-28 VITALS — BP 105/68 | HR 119 | Ht 74.0 in | Wt 209.0 lb

## 2017-12-28 DIAGNOSIS — R0789 Other chest pain: Secondary | ICD-10-CM | POA: Diagnosis not present

## 2017-12-28 DIAGNOSIS — J449 Chronic obstructive pulmonary disease, unspecified: Secondary | ICD-10-CM | POA: Diagnosis not present

## 2017-12-28 DIAGNOSIS — Z79899 Other long term (current) drug therapy: Secondary | ICD-10-CM | POA: Insufficient documentation

## 2017-12-28 DIAGNOSIS — T796XXS Traumatic ischemia of muscle, sequela: Secondary | ICD-10-CM

## 2017-12-28 DIAGNOSIS — S0292XS Unspecified fracture of facial bones, sequela: Secondary | ICD-10-CM | POA: Diagnosis not present

## 2017-12-28 DIAGNOSIS — M79605 Pain in left leg: Secondary | ICD-10-CM | POA: Diagnosis present

## 2017-12-28 DIAGNOSIS — G510 Bell's palsy: Secondary | ICD-10-CM | POA: Insufficient documentation

## 2017-12-28 DIAGNOSIS — Z9889 Other specified postprocedural states: Secondary | ICD-10-CM | POA: Diagnosis not present

## 2017-12-28 DIAGNOSIS — S7292XD Unspecified fracture of left femur, subsequent encounter for closed fracture with routine healing: Secondary | ICD-10-CM | POA: Insufficient documentation

## 2017-12-28 DIAGNOSIS — F4310 Post-traumatic stress disorder, unspecified: Secondary | ICD-10-CM | POA: Diagnosis not present

## 2017-12-28 DIAGNOSIS — S0451XS Injury of facial nerve, right side, sequela: Secondary | ICD-10-CM | POA: Diagnosis not present

## 2017-12-28 DIAGNOSIS — S069X9S Unspecified intracranial injury with loss of consciousness of unspecified duration, sequela: Secondary | ICD-10-CM | POA: Diagnosis not present

## 2017-12-28 DIAGNOSIS — S8412XS Injury of peroneal nerve at lower leg level, left leg, sequela: Secondary | ICD-10-CM

## 2017-12-28 DIAGNOSIS — S069X0D Unspecified intracranial injury without loss of consciousness, subsequent encounter: Secondary | ICD-10-CM | POA: Diagnosis not present

## 2017-12-28 MED ORDER — AMITRIPTYLINE HCL 10 MG PO TABS: 10.0000 mg | ORAL_TABLET | Freq: Every day | ORAL | 3 refills | Status: DC

## 2017-12-28 MED ORDER — OXYCODONE HCL 10 MG PO TABS: 10.0000 mg | ORAL_TABLET | Freq: Four times a day (QID) | ORAL | 0 refills | Status: DC | PRN

## 2017-12-28 NOTE — Patient Instructions (Signed)
TRY TAKING 10MG  OF AMITRIPTYLINE AT BEDTIME FOR 3 DAYS. IF NO EFFECT CAN INCREASE TO 20MG  NIGHTLY.

## 2017-12-28 NOTE — Progress Notes (Deleted)
  Subjective:     Patient ID: Willie Blevins, male   DOB: [birth year], 42 y.o.   MRN: [id number]  HPI   Review of Systems     Objective:   Physical Exam     Assessment:     ***    Plan:     ***
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
## 2018-01-05 ENCOUNTER — Other Ambulatory Visit: Payer: Self-pay | Admitting: Otolaryngology

## 2018-01-05 ENCOUNTER — Encounter (HOSPITAL_COMMUNITY): Payer: Self-pay | Admitting: *Deleted

## 2018-01-05 ENCOUNTER — Other Ambulatory Visit: Payer: Self-pay
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
## 2018-01-06 ENCOUNTER — Ambulatory Visit (HOSPITAL_COMMUNITY): Payer: Worker's Compensation | Admitting: Anesthesiology

## 2018-01-06 ENCOUNTER — Ambulatory Visit (HOSPITAL_COMMUNITY)
Admission: RE | Admit: 2018-01-06 | Discharge: 2018-01-06 | Disposition: A | Discharge status: Home / Self Care | Payer: Worker's Compensation | Source: Ambulatory Visit | Attending: Otolaryngology | Admitting: Otolaryngology

## 2018-01-06 ENCOUNTER — Encounter (HOSPITAL_COMMUNITY)
Admission: RE | Disposition: A | Discharge status: Home / Self Care | Payer: Self-pay | Source: Ambulatory Visit | Attending: Otolaryngology

## 2018-01-06 ENCOUNTER — Encounter (HOSPITAL_COMMUNITY): Payer: Self-pay

## 2018-01-06 DIAGNOSIS — J449 Chronic obstructive pulmonary disease, unspecified: Secondary | ICD-10-CM | POA: Diagnosis not present

## 2018-01-06 DIAGNOSIS — Z472 Encounter for removal of internal fixation device: Secondary | ICD-10-CM | POA: Diagnosis present

## 2018-01-06 DIAGNOSIS — F419 Anxiety disorder, unspecified: Secondary | ICD-10-CM | POA: Diagnosis not present

## 2018-01-06 DIAGNOSIS — Z87442 Personal history of urinary calculi: Secondary | ICD-10-CM | POA: Insufficient documentation

## 2018-01-06 DIAGNOSIS — K219 Gastro-esophageal reflux disease without esophagitis: Secondary | ICD-10-CM | POA: Insufficient documentation

## 2018-01-06 DIAGNOSIS — Z79899 Other long term (current) drug therapy: Secondary | ICD-10-CM | POA: Insufficient documentation

## 2018-01-06 DIAGNOSIS — Z87891 Personal history of nicotine dependence: Secondary | ICD-10-CM | POA: Insufficient documentation

## 2018-01-06 HISTORY — PX: MANDIBULAR HARDWARE REMOVAL: SHX5205

## 2018-01-06 HISTORY — DX: Pneumonia, unspecified organism: J18.9

## 2018-01-06 HISTORY — DX: Adverse effect of unspecified anesthetic, initial encounter: T41.45XA

## 2018-01-06 HISTORY — DX: Anxiety disorder, unspecified: F41.9

## 2018-01-06 HISTORY — DX: Other complications of anesthesia, initial encounter: T88.59XA

## 2018-01-06 HISTORY — DX: Personal history of urinary calculi: Z87.442

## 2018-01-06 LAB — CBC
HCT: 40.8 % (ref 39.0–52.0)
Hemoglobin: 12.2 g/dL — ABNORMAL LOW (ref 13.0–17.0)
MCH: 26 pg (ref 26.0–34.0)
MCHC: 29.9 g/dL — ABNORMAL LOW (ref 30.0–36.0)
MCV: 86.8 fL (ref 80.0–100.0)
NRBC: 0 % (ref 0.0–0.2)
PLATELETS: 451 10*3/uL — AB (ref 150–400)
RBC: 4.7 MIL/uL (ref 4.22–5.81)
RDW: 15.9 % — ABNORMAL HIGH (ref 11.5–15.5)
WBC: 8 10*3/uL (ref 4.0–10.5)

## 2018-01-06 LAB — BASIC METABOLIC PANEL
ANION GAP: 10 (ref 5–15)
BUN: 11 mg/dL (ref 6–20)
CO2: 22 mmol/L (ref 22–32)
Calcium: 9.3 mg/dL (ref 8.9–10.3)
Chloride: 104 mmol/L (ref 98–111)
Creatinine, Ser: 0.74 mg/dL (ref 0.61–1.24)
GLUCOSE: 84 mg/dL (ref 70–99)
POTASSIUM: 3.9 mmol/L (ref 3.5–5.1)
SODIUM: 136 mmol/L (ref 135–145)

## 2018-01-06 SURGERY — REMOVAL, HARDWARE, MANDIBLE
Anesthesia: Monitor Anesthesia Care

## 2018-01-06 MED ORDER — MIDAZOLAM HCL 2 MG/2ML IJ SOLN
INTRAMUSCULAR | Status: AC
  Filled 2018-01-06: qty 2

## 2018-01-06 MED ORDER — CHLORHEXIDINE GLUCONATE CLOTH 2 % EX PADS: 6.0000 | MEDICATED_PAD | Freq: Once | CUTANEOUS | Status: DC

## 2018-01-06 MED ORDER — MIDAZOLAM HCL 5 MG/5ML IJ SOLN
INTRAMUSCULAR | Status: DC | PRN
  Administered 2018-01-06: 2 mg via INTRAVENOUS

## 2018-01-06 MED ORDER — PROPOFOL 10 MG/ML IV BOLUS
INTRAVENOUS | Status: DC | PRN
  Administered 2018-01-06: 30 mg via INTRAVENOUS
  Administered 2018-01-06: 20 mg via INTRAVENOUS

## 2018-01-06 MED ORDER — PROPOFOL 10 MG/ML IV BOLUS
INTRAVENOUS | Status: AC
  Filled 2018-01-06: qty 20

## 2018-01-06 MED ORDER — LIDOCAINE-EPINEPHRINE 1 %-1:100000 IJ SOLN
INTRAMUSCULAR | Status: AC
  Filled 2018-01-06: qty 1

## 2018-01-06 MED ORDER — FENTANYL CITRATE (PF) 250 MCG/5ML IJ SOLN
INTRAMUSCULAR | Status: AC
  Filled 2018-01-06: qty 5

## 2018-01-06 MED ORDER — LACTATED RINGERS IV SOLN
INTRAVENOUS | Status: DC
  Administered 2018-01-06: 10:00:00 via INTRAVENOUS

## 2018-01-06 MED ORDER — 0.9 % SODIUM CHLORIDE (POUR BTL) OPTIME
TOPICAL | Status: DC | PRN
  Administered 2018-01-06: 1000 mL

## 2018-01-06 MED ORDER — FENTANYL CITRATE (PF) 100 MCG/2ML IJ SOLN
INTRAMUSCULAR | Status: DC | PRN
  Administered 2018-01-06 (×2): 100 ug via INTRAVENOUS

## 2018-01-06 SURGICAL SUPPLY — 25 items
BLADE SURG 15 STRL LF DISP TIS (BLADE) IMPLANT
BLADE SURG 15 STRL SS (BLADE)
CANISTER SUCT 3000ML PPV (MISCELLANEOUS) ×2 IMPLANT
COVER SURGICAL LIGHT HANDLE (MISCELLANEOUS) ×2 IMPLANT
COVER WAND RF STERILE (DRAPES) IMPLANT
DRAPE HALF SHEET 40X57 (DRAPES) ×2 IMPLANT
GAUZE 4X4 16PLY RFD (DISPOSABLE) IMPLANT
GLOVE ECLIPSE 7.5 STRL STRAW (GLOVE) ×4 IMPLANT
GOWN STRL REUS W/ TWL LRG LVL3 (GOWN DISPOSABLE) ×2 IMPLANT
GOWN STRL REUS W/TWL LRG LVL3 (GOWN DISPOSABLE) ×2
KIT BASIN OR (CUSTOM PROCEDURE TRAY) ×2 IMPLANT
KIT TURNOVER KIT B (KITS) ×2 IMPLANT
NEEDLE HYPO 25GX1X1/2 BEV (NEEDLE) IMPLANT
NEEDLE PRECISIONGLIDE 27X1.5 (NEEDLE) IMPLANT
NS IRRIG 1000ML POUR BTL (IV SOLUTION) ×2 IMPLANT
PAD ARMBOARD 7.5X6 YLW CONV (MISCELLANEOUS) IMPLANT
SUCTION FRAZIER HANDLE 10FR (MISCELLANEOUS)
SUCTION TUBE FRAZIER 10FR DISP (MISCELLANEOUS) IMPLANT
SUT CHROMIC 3 0 PS 2 (SUTURE) IMPLANT
SUT CHROMIC 4 0 PS 2 18 (SUTURE) IMPLANT
SYR CONTROL 10ML LL (SYRINGE) IMPLANT
TOWEL OR 17X24 6PK STRL BLUE (TOWEL DISPOSABLE) ×2 IMPLANT
TRAY ENT MC OR (CUSTOM PROCEDURE TRAY) ×2 IMPLANT
TUBE CONNECTING 12X1/4 (SUCTIONS) ×2 IMPLANT
YANKAUER SUCT BULB TIP NO VENT (SUCTIONS) ×2 IMPLANT

## 2018-01-06 NOTE — Op Note (Signed)
Preop/postop diagnosis: Mandible fracture Procedure: Removal of hardware with upper and lower arch bars Anesthesia: Gen. Estimated blood loss: Less than 5 mL Indications: 42 year old who sustained a mandible fracture proximal 5 weeks ago and now is out of his wires and rubber bands. He is not having any issues and now time to remove his arch bars. He was informed risks and benefits of the procedure and options were discussed all questions are answered and consent was obtained. Operation: Patient was taken the operating placed supine position after general anesthesia mask ventilation the upper arch bar was removed without difficulty removing the multiple screws within the arch bar. There was good hemostasis. The lower arch bar was removed same fashion removing all the screws and the arch bar. There was good hemostasis. The mouth was suctioned out of some debris and he was awake and brought to cover stable condition counts correct

## 2018-01-06 NOTE — Transfer of Care (Signed)
Immediate Anesthesia Transfer of Care Note  Patient: Buckley R [place]  Procedure(s) Performed: MANDIBULAR HARDWARE REMOVAL (N/A )  Patient Location: PACU  Anesthesia Type:MAC  Level of Consciousness: awake and alert   Airway & Oxygen Therapy: Patient Spontanous Breathing and Patient connected to face mask oxygen  Post-op Assessment: Report given to RN and Post -op Vital signs reviewed and stable  Post vital signs: Reviewed and stable  Last Vitals:  Vitals Value Taken Time  BP 118/64 01/06/2018 12:50 PM  Temp    Pulse 89 01/06/2018 12:52 PM  Resp 18 01/06/2018 12:52 PM  SpO2 91 % 01/06/2018 12:52 PM  Vitals shown include unvalidated device data.  Last Pain:  Vitals:   01/06/18 1007  TempSrc:   PainSc: 8       Patients Stated Pain Goal: 3 (50/35/46 5681)  Complications: No apparent anesthesia complications

## 2018-01-06 NOTE — Anesthesia Postprocedure Evaluation (Signed)
Anesthesia Post Note  Patient: Willie Blevins  Procedure(s) Performed: MANDIBULAR HARDWARE REMOVAL (N/A )     Patient location during evaluation: PACU Anesthesia Type: MAC Level of consciousness: awake and alert Pain management: pain level controlled Vital Signs Assessment: post-procedure vital signs reviewed and stable Respiratory status: spontaneous breathing, nonlabored ventilation, respiratory function stable and patient connected to nasal cannula oxygen Cardiovascular status: stable and blood pressure returned to baseline Postop Assessment: no apparent nausea or vomiting Anesthetic complications: no    Last Vitals:  Vitals:   01/06/18 1250 01/06/18 1320  BP: 118/64 107/72  Pulse:  81  Resp:  (!) 26  Temp: 36.7 C 36.5 C  SpO2:  96%    Last Pain:  Vitals:   01/06/18 1320  TempSrc:   PainSc: 0-No pain                 Kendarious Gudino DAVID

## 2018-01-06 NOTE — H&P (Signed)
Willie Blevins is an 42 y.o. male.   Chief Complaint: mandible fracture HPI: history a mandible fracture and multiple other injuries. He's now 5 weeks out from his injury and ready to have the arch bars removal.  Past Medical History:  Diagnosis Date  . Anxiety    since accident  . Bullous emphysema ([place])   . Complication of anesthesia    woke up "swinging"  . COPD (chronic obstructive pulmonary disease) ([place])   . GERD (gastroesophageal reflux disease)   . History of kidney stones   . Open right fibular fracture 12/02/2017  . Pneumonia   . Strabismus    with lazy eye (left)  . Traumatic iritis     Past Surgical History:  Procedure Laterality Date  . APPLICATION OF WOUND VAC Right 11/29/2017   Procedure: APPLICATION OF WOUND VAC;  Surgeon: Altamese Norman, MD;  Location: [facility];  Service: Orthopedics;  Laterality: Right;  . FACIAL LACERATION REPAIR Left 11/29/2017   Procedure: ear LACERATION REPAIR;  Surgeon: Georganna Skeans, MD;  Location: [facility] [place];  Service: General;  Laterality: Left;  . I&D EXTREMITY Right 11/29/2017   Procedure: IRRIGATION AND DEBRIDEMENT EXTREMITY;  Surgeon: Altamese Clearlake Riviera, MD;  Location: [facility];  Service: Orthopedics;  Laterality: Right;  . I&D EXTREMITY Right 12/01/2017   Procedure: IRRIGATION AND DEBRIDEMENT RIGHT LOWER EXTREMITY AND VAC CHANGE;  Surgeon: Altamese Gridley, MD;  Location: [facility];  Service: Orthopedics;  Laterality: Right;  . INTRAMEDULLARY (IM) NAIL INTERTROCHANTERIC Left 11/29/2017   Procedure: INTRAMEDULLARY (IM) NAIL INTERTROCHANTRIC;  Surgeon: Altamese Descanso, MD;  Location: [facility] [place];  Service: Orthopedics;  Laterality: Left;  . ORIF MANDIBULAR FRACTURE N/A 12/01/2017   Procedure: OPEN REDUCTION INTERNAL FIXATION (ORIF) MANDIBULAR FRACTURE;  Surgeon: Melissa Montane, MD;  Location: [facility];  Service: ENT;  Laterality: N/A;  . TRACHEOSTOMY TUBE PLACEMENT N/A 12/01/2017   Procedure: TRACHEOSTOMY;  Surgeon: Melissa Montane, MD;  Location: [facility] OR;  Service: ENT;   Laterality: N/A;  . TYMPANOMASTOIDECTOMY Right 12/05/2017   Procedure: right canal up tympanomastoid, right mastoidectomy, exploration right facial nerve, Type 1 tympanoplasty, CSF leak repair.;  Surgeon: Vicie Mutters, MD;  Location: [place];  Service: ENT;  Laterality: Right;    Family History  Problem Relation Age of Onset  . Diabetes Mother   . Bell's palsy Mother   . Diabetes Maternal Uncle   . Diabetes Paternal Uncle    Social History:  reports that he quit smoking about 12 months ago. He has never used smokeless tobacco. He reports that he drinks alcohol. He reports that he does not use drugs.  Allergies:  Allergies  Allergen Reactions  . Penicillins Hives    Has patient had a PCN reaction causing immediate rash, facial/tongue/throat swelling, SOB or lightheadedness with hypotension: Yes Has patient had a PCN reaction causing severe rash involving mucus membranes or skin necrosis: No Has patient had a PCN reaction that required hospitalization: No Has patient had a PCN reaction occurring within the last 10 years: No If all of the above answers are "NO", then may proceed with Cephalosporin use.     Medications Prior to Admission  Medication Sig Dispense Refill  . acetaminophen (TYLENOL) 500 MG tablet Take 1-2 tablets (500-1,000 mg total) by mouth every 8 (eight) hours. (Patient taking differently: Take 500-1,000 mg by mouth every 8 (eight) hours as needed for mild pain. ) 180 tablet 0  . amitriptyline (ELAVIL) 10 MG tablet Take 1-2 tablets (10-20 mg total) by mouth at bedtime. (Patient  taking differently: Take 20 mg by mouth at bedtime. ) 60 tablet 3  . ascorbic acid (VITAMIN C) 1000 MG tablet Take 1 tablet (1,000 mg total) by mouth daily. 30 tablet 0  . Cholecalciferol (VITAMIN D3) 250 MCG (10000 UT) capsule Take 10,000 Units by mouth daily.    . famotidine (PEPCID) 20 MG tablet Take 1 tablet (20 mg total) by mouth 2 (two) times daily. 60 tablet 0  . gabapentin (NEURONTIN) 600 MG  tablet Take 1 tablet (600 mg total) by mouth every 8 (eight) hours. 90 tablet 0  . hydrOXYzine (ATARAX/VISTARIL) 25 MG tablet Take 1 tablet (25 mg total) by mouth 3 (three) times daily as needed for anxiety. 90 tablet 0  . lactobacillus (FLORANEX/LACTINEX) PACK Take 1 packet (1 g total) by mouth 3 (three) times daily with meals. (Patient taking differently: Take 1 g by mouth daily. ) 90 packet 0  . methocarbamol (ROBAXIN) 750 MG tablet Take 1 tablet (750 mg total) by mouth 3 (three) times daily. 90 tablet 1  . mouth rinse LIQD solution 15 mLs by Mouth Rinse route every 4 (four) hours. (Patient taking differently: 15 mLs by Mouth Rinse route 3 (three) times daily. ) 946 mL 0  . Multiple Vitamin (MULTIVITAMIN WITH MINERALS) TABS tablet Take 1 tablet by mouth daily.    . Oxycodone HCl 10 MG TABS Take 1 tablet (10 mg total) by mouth every 6 (six) hours as needed (pain). 120 tablet 0  . polycarbophil (FIBERCON) 625 MG tablet Take 1 tablet (625 mg total) by mouth 2 (two) times daily. 60 tablet 0  . polyvinyl alcohol (LIQUIFILM TEARS) 1.4 % ophthalmic solution Place 2 drops into the right eye every 4 (four) hours. 15 mL 4  . traZODone (DESYREL) 50 MG tablet Take 0.5-1 tablets (25-50 mg total) by mouth at bedtime as needed for sleep. (Patient taking differently: Take 50 mg by mouth at bedtime as needed for sleep. ) 30 tablet 0  . White Petrolatum-Mineral Oil (STYE) 31.9-57.7 % OINT Place 1 application into the right eye 2 (two) times daily.    Marland Kitchen artificial tears (LACRILUBE) OINT ophthalmic ointment Place into the right eye at bedtime and may repeat dose one time if needed. (Patient not taking: Reported on 01/05/2018) 7 g 2  . benzocaine (ORAJEL) 10 % mucosal gel Use as directed in the mouth or throat 4 (four) times daily as needed for mouth pain (put a bedside). 5.3 g 0  . enoxaparin (LOVENOX) 40 MG/0.4ML injection Inject 0.4 mLs (40 mg total) into the skin daily. (Patient not taking: Reported on 01/05/2018) 7  Syringe 0  . lidocaine (LIDODERM) 5 % Apply 2 patches to ribs at 7 am and remove at 7 pm (Patient not taking: Reported on 01/05/2018) 90 patch 0    Results for orders placed or performed during the hospital encounter of 01/06/18 (from the past 48 hour(s))  Basic metabolic panel     Status: None   Collection Time: 01/06/18  9:57 AM  Result Value Ref Range   Sodium 136 135 - 145 mmol/L   Potassium 3.9 3.5 - 5.1 mmol/L   Chloride 104 98 - 111 mmol/L   CO2 22 22 - 32 mmol/L   Glucose, Bld 84 70 - 99 mg/dL   BUN 11 6 - 20 mg/dL   Creatinine, Ser 0.74 0.61 - 1.24 mg/dL   Calcium 9.3 8.9 - 10.3 mg/dL   GFR calc non Af Amer >60 >60 mL/min   GFR calc  Af Amer >60 >60 mL/min    Comment: (NOTE) The eGFR has been calculated using the CKD EPI equation. This calculation has not been validated in all clinical situations. eGFR's persistently <60 mL/min signify possible Chronic Kidney Disease.    Anion gap 10 5 - 15    Comment: Performed at [facility] [address] [place], [place] [address]  CBC     Status: Abnormal   Collection Time: 01/06/18  9:57 AM  Result Value Ref Range   WBC 8.0 4.0 - 10.5 K/uL   RBC 4.70 4.22 - 5.81 MIL/uL   Hemoglobin 12.2 (L) 13.0 - 17.0 g/dL   HCT 40.8 39.0 - 52.0 %   MCV 86.8 80.0 - 100.0 fL   MCH 26.0 26.0 - 34.0 pg   MCHC 29.9 (L) 30.0 - 36.0 g/dL   RDW 15.9 (H) 11.5 - 15.5 %   Platelets 451 (H) 150 - 400 K/uL   nRBC 0.0 0.0 - 0.2 %    Comment: Performed at [facility], [place] [address], [place], [place] [address]   No results found.  Review of Systems  Constitutional: Negative.   HENT: Negative.   Eyes: Negative.   Respiratory: Negative.   Cardiovascular: Negative.   Skin: Negative.     Blood pressure 97/66, pulse 91, temperature 97.6 F (36.4 C), temperature source Oral, resp. rate 18, height '6\' 2"'  (1.88 m), weight 94.8 kg, SpO2 99 %. Physical Exam  Constitutional: He appears well-developed and well-nourished.  HENT:  Head:  Normocephalic and atraumatic.  Nose: Nose normal.  Eyes: Pupils are equal, round, and reactive to light. Conjunctivae are normal.  Neck: Normal range of motion. Neck supple.  Cardiovascular: Normal rate.  Respiratory: Effort normal.  GI: Soft.  Musculoskeletal: Normal range of motion.     Assessment/Plan Mandible fracture-he is ready to have the hardware removed. We discussed the procedure. Is ready to proceed  Melissa Montane, MD 01/06/2018, 10:54 AM

## 2018-01-06 NOTE — Anesthesia Preprocedure Evaluation (Signed)
Anesthesia Evaluation  Patient identified by MRN, date of birth, ID band Patient awake    Reviewed: Allergy & Precautions, NPO status , Patient's Chart, lab work & pertinent test results  Airway Mallampati: I  TM Distance: >3 FB Neck ROM: Full    Dental   Pulmonary former smoker,    Pulmonary exam normal        Cardiovascular Normal cardiovascular exam     Neuro/Psych Anxiety    GI/Hepatic GERD  Medicated and Controlled,  Endo/Other    Renal/GU      Musculoskeletal   Abdominal   Peds  Hematology   Anesthesia Other Findings   Reproductive/Obstetrics                             Anesthesia Physical Anesthesia Plan  ASA: III  Anesthesia Plan: General   Post-op Pain Management:    Induction: Intravenous  PONV Risk Score and Plan: 2 and Ondansetron and Treatment may vary due to age or medical condition  Airway Management Planned: Oral ETT  Additional Equipment:   Intra-op Plan:   Post-operative Plan: Extubation in OR  Informed Consent: I have reviewed the patients History and Physical, chart, labs and discussed the procedure including the risks, benefits and alternatives for the proposed anesthesia with the patient or authorized representative who has indicated his/her understanding and acceptance.     Plan Discussed with: CRNA and Surgeon  Anesthesia Plan Comments:         Anesthesia Quick Evaluation

## 2018-01-09 ENCOUNTER — Encounter (HOSPITAL_COMMUNITY): Payer: Self-pay | Admitting: Otolaryngology

## 2018-01-09 ENCOUNTER — Telehealth: Payer: Self-pay | Admitting: Physical Medicine & Rehabilitation

## 2018-01-09 NOTE — Telephone Encounter (Signed)
McKenzie [phone] ext [phone] - did not identify where she is calling from  Wants to know if patient is continuing home health or discharge?

## 2018-01-09 NOTE — Telephone Encounter (Signed)
Call was from [facility].  I called back and left the information from last appt 11/13 that indicateds HH is continuing.

## 2018-01-11 ENCOUNTER — Telehealth: Payer: Self-pay

## 2018-01-11 NOTE — Telephone Encounter (Signed)
Bronson IngYvette, OT from [facility] called requesting orders for OT 1wk1, 2wk2,1wk1. Orders approved per discharge summary. She also states patient needs an order for a grab bar for by the commode.

## 2018-01-16 NOTE — Telephone Encounter (Signed)
yes

## 2018-01-17 ENCOUNTER — Telehealth: Payer: Self-pay

## 2018-01-17 NOTE — Telephone Encounter (Signed)
Bronson IngYvette, OT from [facility] called stating she is discharging pt from OT today, he is going to start Outpt Therapy.

## 2018-01-18 ENCOUNTER — Encounter (INDEPENDENT_AMBULATORY_CARE_PROVIDER_SITE_OTHER): Payer: Self-pay | Admitting: Ophthalmology

## 2018-01-18 ENCOUNTER — Ambulatory Visit (INDEPENDENT_AMBULATORY_CARE_PROVIDER_SITE_OTHER): Payer: Worker's Compensation | Admitting: Ophthalmology

## 2018-01-18 DIAGNOSIS — S0451XS Injury of facial nerve, right side, sequela: Secondary | ICD-10-CM

## 2018-01-18 DIAGNOSIS — H3581 Retinal edema: Secondary | ICD-10-CM

## 2018-01-18 DIAGNOSIS — T1490XA Injury, unspecified, initial encounter: Secondary | ICD-10-CM

## 2018-01-18 DIAGNOSIS — H16211 Exposure keratoconjunctivitis, right eye: Secondary | ICD-10-CM

## 2018-01-18 DIAGNOSIS — H53002 Unspecified amblyopia, left eye: Secondary | ICD-10-CM

## 2018-01-18 DIAGNOSIS — H0223A Paralytic lagophthalmos right eye, upper and lower eyelids: Secondary | ICD-10-CM

## 2018-01-18 DIAGNOSIS — H4311 Vitreous hemorrhage, right eye: Secondary | ICD-10-CM

## 2018-01-18 NOTE — Progress Notes (Addendum)
[facility] - Clinic Note  01/18/2018     CHIEF COMPLAINT Patient presents for Retina Evaluation   HISTORY OF PRESENT ILLNESS: Willie Blevins is a 42 y.o. male who presents to the clinic today for:   HPI    Retina Evaluation    In right eye.  This started 2 months ago.  Duration of 2 months.  Associated Symptoms Pain and Trauma.  Negative for Flashes, Floaters, Redness, Distortion and Fever.  Context:  dim lighting.  Treatments tried include artificial tears.  Response to treatment was mild improvement.  I, the attending physician,  performed the HPI with the patient and updated documentation appropriately.          Comments    [facility]. Patient has a tree fall on him.  Went to hospital for 28 days. While in the hospital noticed vision in the right eye was blurry. Dr could see top part of eye but not the bottom. Patient tapes eye closed because the lid doesn't stay closed. Uses gtts and ung to keep eye moist.        Last edited by Rennis ChrisZamora, Brian, MD on 01/18/2018 11:18 AM. (History)    pt recently saw Dr. Aura CampsMichael Spencer who sent him here because he cant see all the way in the back of his eye and he was concerned about the nerves, pt was in an accident at work and broke both his legs, he spent 28 days in the hospital, pts wife states that while the pt was in the hospital he kept saying that his vision is blurry, pts wife states she kept asking for an eye dr, but they kept pushing it to the side, pts wife states that in the hospital all they did was put drops, ointment and tape the ye shut so his cornea wouldn't dry out, wife states that since they came home from the hospital his vision has gotten worse, wife states they are still taping the eye shut at home, 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 Karleen HampshireSpencer the Friday before Thanksgiving, pt states his left eye is a lazy eye, pt was told to patch the eye when  he was a child but didn't, wife states that left eye seems to have gotten stronger since they are patching the right eye, the bone below the pts eye was broken in the accident, pt states the ENT in the hospital talked about putting 2 sutures in the corner of his eye and then putting in gold weights to keep the eye lid closed  Referring physician: Caroline MoreWaldmann, Jonathan D, MD [address] [id number] [facility] [address], [place] [id number]  HISTORICAL INFORMATION:   Selected notes from the MEDICAL RECORD NUMBER Referred by Dr. Nedra HaiLEE:  Ocular Hx- PMH-    CURRENT MEDICATIONS: Current Outpatient Medications (Ophthalmic Drugs)  Medication Sig  . artificial tears (LACRILUBE) OINT ophthalmic ointment Place into the right eye at bedtime and may repeat dose one time if needed. (Patient not taking: Reported on 01/05/2018)  . polyvinyl alcohol (LIQUIFILM TEARS) 1.4 % ophthalmic solution Place 2 drops into the right eye every 4 (four) hours.  Cliffton Asters. White Petrolatum-Mineral Oil (STYE) 31.9-57.7 % OINT Place 1 application into the right eye 2 (two) times daily.   No current facility-administered medications for this visit.  (Ophthalmic Drugs)   Current Outpatient Medications (Other)  Medication Sig  . acetaminophen (  TYLENOL) 500 MG tablet Take 1-2 tablets (500-1,000 mg total) by mouth every 8 (eight) hours. (Patient taking differently: Take 500-1,000 mg by mouth every 8 (eight) hours as needed for mild pain. )  . amitriptyline (ELAVIL) 10 MG tablet Take 1-2 tablets (10-20 mg total) by mouth at bedtime. (Patient taking differently: Take 20 mg by mouth at bedtime. )  . ascorbic acid (VITAMIN C) 1000 MG tablet Take 1 tablet (1,000 mg total) by mouth daily.  . benzocaine (ORAJEL) 10 % mucosal gel Use as directed in the mouth or throat 4 (four) times daily as needed for mouth pain (put a bedside).  . Cholecalciferol (VITAMIN D3) 250 MCG (10000 UT) capsule Take 10,000 Units by mouth daily.  Marland Kitchen enoxaparin  (LOVENOX) 40 MG/0.4ML injection Inject 0.4 mLs (40 mg total) into the skin daily. (Patient not taking: Reported on 01/05/2018)  . famotidine (PEPCID) 20 MG tablet Take 1 tablet (20 mg total) by mouth 2 (two) times daily.  Marland Kitchen gabapentin (NEURONTIN) 600 MG tablet Take 1 tablet (600 mg total) by mouth every 8 (eight) hours.  . hydrOXYzine (ATARAX/VISTARIL) 25 MG tablet Take 1 tablet (25 mg total) by mouth 3 (three) times daily as needed for anxiety.  . lactobacillus (FLORANEX/LACTINEX) PACK Take 1 packet (1 g total) by mouth 3 (three) times daily with meals. (Patient taking differently: Take 1 g by mouth daily. )  . lidocaine (LIDODERM) 5 % Apply 2 patches to ribs at 7 am and remove at 7 pm (Patient not taking: Reported on 01/05/2018)  . methocarbamol (ROBAXIN) 750 MG tablet Take 1 tablet (750 mg total) by mouth 3 (three) times daily.  Marland Kitchen mouth rinse LIQD solution 15 mLs by Mouth Rinse route every 4 (four) hours. (Patient taking differently: 15 mLs by Mouth Rinse route 3 (three) times daily. )  . Multiple Vitamin (MULTIVITAMIN WITH MINERALS) TABS tablet Take 1 tablet by mouth daily.  . Oxycodone HCl 10 MG TABS Take 1 tablet (10 mg total) by mouth every 6 (six) hours as needed (pain).  . polycarbophil (FIBERCON) 625 MG tablet Take 1 tablet (625 mg total) by mouth 2 (two) times daily.  . traZODone (DESYREL) 50 MG tablet Take 0.5-1 tablets (25-50 mg total) by mouth at bedtime as needed for sleep. (Patient taking differently: Take 50 mg by mouth at bedtime as needed for sleep. )   No current facility-administered medications for this visit.  (Other)      REVIEW OF SYSTEMS:    ALLERGIES Allergies  Allergen Reactions  . Penicillins Hives    Has patient had a PCN reaction causing immediate rash, facial/tongue/throat swelling, SOB or lightheadedness with hypotension: Yes Has patient had a PCN reaction causing severe rash involving mucus membranes or skin necrosis: No Has patient had a PCN reaction  that required hospitalization: No Has patient had a PCN reaction occurring within the last 10 years: No If all of the above answers are "NO", then may proceed with Cephalosporin use.     PAST MEDICAL HISTORY Past Medical History:  Diagnosis Date  . Anxiety    since accident  . Bullous emphysema (HCC)   . Complication of anesthesia    woke up "swinging"  . COPD (chronic obstructive pulmonary disease) (HCC)   . GERD (gastroesophageal reflux disease)   . History of kidney stones   . Open right fibular fracture 12/02/2017  . Pneumonia   . Strabismus    with lazy eye (left)  . Traumatic iritis    Past Surgical  History:  Procedure Laterality Date  . APPLICATION OF WOUND VAC Right 11/29/2017   Procedure: APPLICATION OF WOUND VAC;  Surgeon: Myrene Galas, MD;  Location: [facility] OR;  Service: Orthopedics;  Laterality: Right;  . FACIAL LACERATION REPAIR Left 11/29/2017   Procedure: ear LACERATION REPAIR;  Surgeon: Violeta Gelinas, MD;  Location: [facility] OR;  Service: General;  Laterality: Left;  . I&D EXTREMITY Right 11/29/2017   Procedure: IRRIGATION AND DEBRIDEMENT EXTREMITY;  Surgeon: Myrene Galas, MD;  Location: [facility] OR;  Service: Orthopedics;  Laterality: Right;  . I&D EXTREMITY Right 12/01/2017   Procedure: IRRIGATION AND DEBRIDEMENT RIGHT LOWER EXTREMITY AND VAC CHANGE;  Surgeon: Myrene Galas, MD;  Location: [facility] OR;  Service: Orthopedics;  Laterality: Right;  . INTRAMEDULLARY (IM) NAIL INTERTROCHANTERIC Left 11/29/2017   Procedure: INTRAMEDULLARY (IM) NAIL INTERTROCHANTRIC;  Surgeon: Myrene Galas, MD;  Location: [facility] OR;  Service: Orthopedics;  Laterality: Left;  Marland Kitchen MANDIBULAR HARDWARE REMOVAL N/A 01/06/2018   Procedure: MANDIBULAR HARDWARE REMOVAL;  Surgeon: Suzanna Obey, MD;  Location[facility] OR;  Service: ENT;  Laterality: N/A;  . ORIF MANDIBULAR FRACTURE N/A 12/01/2017   Procedure: OPEN REDUCTION INTERNAL FIXATION (ORIF) MANDIBULAR FRACTURE;  Surgeon: Suzanna Obey, MD;  Location[facility] OR;   Service: ENT;  Laterality: N/A;  . TRACHEOSTOMY TUBE PLACEMENT N/A 12/01/2017   Procedure: TRACHEOSTOMY;  Surgeon: Suzanna Obey, MD;  Location: [facility] OR;  Service: ENT;  Laterality: N/A;  . TYMPANOMASTOIDECTOMY Right 12/05/2017   Procedure: right canal up tympanomastoid, right mastoidectomy, exploration right facial nerve, Type 1 tympanoplasty, CSF leak repair.;  Surgeon: Ermalinda Barrios, MD;  Location: [facility] OR;  Service: ENT;  Laterality: Right;    FAMILY HISTORY Family History  Problem Relation Age of Onset  . Diabetes Mother   . Bell's palsy Mother   . Diabetes Maternal Uncle   . Diabetes Paternal Uncle     SOCIAL HISTORY Social History   Tobacco Use  . Smoking status: Former Smoker    Last attempt to quit: 12/21/2016    Years since quitting: 1.0  . Smokeless tobacco: Never Used  Substance Use Topics  . Alcohol use: Yes    Comment: rare  . Drug use: Never         OPHTHALMIC EXAM:  Base Eye Exam    Visual Acuity (Snellen - Linear)      Right Left   Dist [place] 20/800 20/30 -2   Dist ph [place] NI 20/25 -2       Tonometry (Tonopen, 10:26 AM)      Right Left   Pressure 8 12       Pupils      Dark Light Shape React APD   Right 2 1 Round Minimal None   Left 2 1 Round Minimal None       Visual Fields (Counting fingers)      Left Right    Full Full       Extraocular Movement      Right Left    Full, Ortho Full, Ortho       Neuro/Psych    Oriented x3:  Yes   Mood/Affect:  Normal       Dilation    Both eyes:  1.0% Mydriacyl, 2.5% Phenylephrine @ 10:26 AM        Slit Lamp and Fundus Exam    Slit Lamp Exam      Right Left   Lids/Lashes Ptosis; lagophthalmos; no blink Telangiectasia, Meibomian gland dysfunction, Ptosis   Conjunctiva/Sclera inferior scleral show White and  quiet   Cornea 3-4+ Punctate epithelial erosions, irregular epithelium paracentral scar   Anterior Chamber Deep and quiet Deep and quiet   Iris Round and dilated Round and dilated   Lens 2+ Nuclear  sclerosis, 1+ Cortical cataract 2+ Nuclear sclerosis, 1+ Cortical cataract   Vitreous hazy view old vitreous hemorrhage -- white and settling inferiorly clear       Fundus Exam      Right Left   Disc hazy view perfused, sharp Pink and Sharp   C/D Ratio  0.3   Macula hazy view; grossly flat Flat, Good foveal reflex, No heme or edema   Vessels hazy view; grossly normal Normal   Periphery hazy view Attached    Attached             IMAGING AND PROCEDURES  Imaging and Procedures for @TODAY @  OCT, Retina - OU - Both Eyes       Right Eye Quality was good. Progression has no prior data.   Left Eye Quality was good. Central Foveal Thickness: 279. Progression has no prior data. Findings include normal foveal contour, no SRF, no IRF.   Notes *Images captured and stored on drive  Diagnosis / Impression:  OD: no detail, attached peripherally, ?partial PVD OS: NFP, No IRF/SRF   Clinical management:  See below  Abbreviations: NFP - Normal foveal profile. CME - cystoid macular edema. PED - pigment epithelial detachment. IRF - intraretinal fluid. SRF - subretinal fluid. EZ - ellipsoid zone. ERM - epiretinal membrane. ORA - outer retinal atrophy. ORT - outer retinal tubulation. SRHM - subretinal hyper-reflective material                 ASSESSMENT/PLAN:    ICD-10-CM   1. Vitreous hemorrhage of right eye (HCC) H43.11   2. Trauma T14.90XA   3. Retinal edema H35.81 OCT, Retina - OU - Both Eyes  4. 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 Vitreous Hemorrhage OD  - likely sustained during traumatic tree injury on 10.15.19  - on exam today - VH white and chronic looking and quite diffuse  - BCVA 20/800  - B-scan ultrasound shows diffuse VH, no obvious RT/RD  - discussed findings and treatment options:  observation vs surgery  - recommend observation and advised bed rest and sleeping with head elevated  - VH precautions reviewed -- minimize activities, keep head elevated, avoid ASA/NSAIDs/blood thinners as able  - f/u 2 weeks  3. No retinal edema on exam or OCT  4-6. 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 History of amblyopia OS - pt reports history of patching as child - states baseline vision was ~20/40 OS, but thinks VA has improved since injury to OD - monitor   Ophthalmic Meds Ordered this visit:  No orders of the defined types were placed in this encounter.      Return in  about 2 weeks (around 02/01/2018) for F/U VH, DFE, OCT.  There are no Patient Instructions on file for this visit.   Explained the diagnoses, plan, and follow up with the patient and they expressed understanding.  Patient expressed understanding of the importance of proper follow up care.   This document serves as a record of services personally performed by Karie Chimera, MD, PhD. It was created on their behalf by Laurian Brim, OA, an ophthalmic assistant. The creation of this record is the provider's dictation and/or activities during the visit.    Electronically signed by: Laurian Brim, OA  12.04.19 1:49 PM    Karie Chimera, M.D., Ph.D. Diseases & Surgery of the Retina and Vitreous [facility]  I have reviewed the above documentation for accuracy and completeness, and I agree with the above. Karie Chimera, M.D., Ph.D. 01/18/18 2:01 PM      Abbreviations: M myopia (nearsighted); A astigmatism; H hyperopia (farsighted); P presbyopia; Mrx spectacle prescription;  CTL contact lenses; OD right eye; OS left eye; OU both eyes  XT exotropia; ET esotropia; PEK punctate epithelial keratitis; PEE punctate epithelial erosions; DES dry eye syndrome; MGD meibomian gland dysfunction; ATs artificial tears; PFAT's preservative free artificial tears; NSC nuclear sclerotic cataract; PSC posterior subcapsular cataract; ERM epi-retinal membrane; PVD posterior vitreous detachment; RD retinal detachment; DM diabetes mellitus; DR diabetic retinopathy; NPDR non-proliferative diabetic retinopathy; PDR proliferative diabetic retinopathy; CSME clinically significant macular edema; DME diabetic macular edema; dbh dot blot hemorrhages; CWS cotton wool spot; POAG primary open angle glaucoma; C/D cup-to-disc ratio; HVF humphrey visual field; GVF goldmann visual field; OCT optical coherence tomography; IOP intraocular pressure; BRVO Branch retinal vein occlusion; CRVO central retinal vein occlusion; CRAO central retinal artery occlusion; BRAO branch retinal artery occlusion; RT retinal tear; SB scleral buckle; PPV pars plana vitrectomy; VH Vitreous hemorrhage; PRP panretinal laser photocoagulation; IVK intravitreal kenalog; VMT vitreomacular traction; MH Macular hole;  NVD neovascularization of the disc; NVE neovascularization elsewhere; AREDS age related eye disease study; ARMD age related macular degeneration; POAG primary open angle glaucoma; EBMD epithelial/anterior basement membrane dystrophy; ACIOL anterior chamber intraocular lens; IOL intraocular lens; PCIOL posterior chamber intraocular lens; Phaco/IOL phacoemulsification with intraocular lens placement; PRK photorefractive keratectomy; LASIK laser assisted in situ keratomileusis; HTN hypertension; DM diabetes mellitus; COPD chronic obstructive pulmonary disease

## 2018-01-18 NOTE — Progress Notes (Signed)
[facility] - Clinic Note  01/18/2018     CHIEF COMPLAINT Patient presents for Retina Evaluation   HISTORY OF PRESENT ILLNESS: Willie Blevins [name] is a 42 y.o. male who presents to the clinic today for:   HPI    Retina Evaluation    In right eye.  This started 2 months ago.  Duration of 2 months.  Associated Symptoms Pain and Trauma.  Negative for Flashes, Floaters, Redness, Distortion and Fever.  Context:  dim lighting.  Treatments tried include artificial tears.  Response to treatment was mild improvement.  I, the attending physician,  performed the HPI with the patient and updated documentation appropriately.          Comments    Patient referred by Dr Karleen Hampshire for [facility]. Patient has a tree fall on him.  Went to hospital for 28 days. While in the hospital noticed vision in the right eye was blurry. Dr could see top part of eye but not the bottom. Patient tapes eye closed because the lid doesn't stay closed. Uses gtts and ung to keep eye moist.        Last edited by Rennis Chris, MD on 01/18/2018 11:18 AM. (History)    pt recently saw Dr. Aura Camps who sent him here because he cant see all the way in the back of his eye and he was concerned about the nerves, pt was in an accident at work and broke both his legs, he spent 28 days in the hospital, pts wife states that while the pt was in the hospital he kept saying that his vision is blurry, pts wife states she kept asking for an eye dr, but they kept pushing it to the side, pts wife states that in the hospital all they did was put drops, ointment and tape the ye shut so his cornea wouldn't dry out, wife states that since they came home from the hospital his vision has gotten worse, wife states they are still taping the eye shut at home, 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 [place], [place] [address]  HISTORICAL INFORMATION:   Selected notes from the MEDICAL RECORD NUMBER Referred by Dr. Nedra Hai:  Ocular Hx- PMH-    CURRENT MEDICATIONS: Current Outpatient Medications (Ophthalmic Drugs)  Medication Sig  . artificial tears (LACRILUBE) OINT ophthalmic ointment Place into the right eye at bedtime and may repeat dose one time if needed. (Patient not taking: Reported on 01/05/2018)  . polyvinyl alcohol (LIQUIFILM TEARS) 1.4 % ophthalmic solution Place 2 drops into the right eye every 4 (four) hours.  Cliffton Asters Petrolatum-Mineral Oil (STYE) 31.9-57.7 % OINT Place 1 application into the right eye 2 (two) times daily.   No current facility-administered medications for this visit.  (Ophthalmic Drugs)   Current Outpatient Medications (Other)  Medication Sig  . acetaminophen (TYLENOL) 500 MG tablet  Take 1-2 tablets (500-1,000 mg total) by mouth every 8 (eight) hours. (Patient taking differently: Take 500-1,000 mg by mouth every 8 (eight) hours as needed for mild pain. )  . amitriptyline (ELAVIL) 10 MG tablet Take 1-2 tablets (10-20 mg total) by mouth at bedtime. (Patient taking differently: Take 20 mg by mouth at bedtime. )  . ascorbic acid (VITAMIN C) 1000 MG tablet Take 1 tablet (1,000 mg total) by mouth daily.  . benzocaine (ORAJEL) 10 % mucosal gel Use as directed in the mouth or throat 4 (four) times daily as needed for mouth pain (put a bedside).  . Cholecalciferol (VITAMIN D3) 250 MCG (10000 UT) capsule Take 10,000 Units by mouth daily.  Marland Kitchen enoxaparin (LOVENOX) 40 MG/0.4ML  injection Inject 0.4 mLs (40 mg total) into the skin daily. (Patient not taking: Reported on 01/05/2018)  . famotidine (PEPCID) 20 MG tablet Take 1 tablet (20 mg total) by mouth 2 (two) times daily.  Marland Kitchen gabapentin (NEURONTIN) 600 MG tablet Take 1 tablet (600 mg total) by mouth every 8 (eight) hours.  . hydrOXYzine (ATARAX/VISTARIL) 25 MG tablet Take 1 tablet (25 mg total) by mouth 3 (three) times daily as needed for anxiety.  . lactobacillus (FLORANEX/LACTINEX) PACK Take 1 packet (1 g total) by mouth 3 (three) times daily with meals. (Patient taking differently: Take 1 g by mouth daily. )  . lidocaine (LIDODERM) 5 % Apply 2 patches to ribs at 7 am and remove at 7 pm (Patient not taking: Reported on 01/05/2018)  . methocarbamol (ROBAXIN) 750 MG tablet Take 1 tablet (750 mg total) by mouth 3 (three) times daily.  Marland Kitchen mouth rinse LIQD solution 15 mLs by Mouth Rinse route every 4 (four) hours. (Patient taking differently: 15 mLs by Mouth Rinse route 3 (three) times daily. )  . Multiple Vitamin (MULTIVITAMIN WITH MINERALS) TABS tablet Take 1 tablet by mouth daily.  . Oxycodone HCl 10 MG TABS Take 1 tablet (10 mg total) by mouth every 6 (six) hours as needed (pain).  . polycarbophil (FIBERCON) 625 MG tablet Take 1 tablet (625 mg total) by mouth 2 (two) times daily.  . traZODone (DESYREL) 50 MG tablet Take 0.5-1 tablets (25-50 mg total) by mouth at bedtime as needed for sleep. (Patient taking differently: Take 50 mg by mouth at bedtime as needed for sleep. )   No current facility-administered medications for this visit.  (Other)      REVIEW OF SYSTEMS:    ALLERGIES Allergies  Allergen Reactions  . Penicillins Hives    Has patient had a PCN reaction causing immediate rash, facial/tongue/throat swelling, SOB or lightheadedness with hypotension: Yes Has patient had a PCN reaction causing severe rash involving mucus membranes or skin necrosis: No Has patient had a PCN reaction that required  hospitalization: No Has patient had a PCN reaction occurring within the last 10 years: No If all of the above answers are "NO", then may proceed with Cephalosporin use.     PAST MEDICAL HISTORY Past Medical History:  Diagnosis Date  . Anxiety    since accident  . Bullous emphysema (HCC)   . Complication of anesthesia    woke up "swinging"  . COPD (chronic obstructive pulmonary disease) (HCC)   . GERD (gastroesophageal reflux disease)   . History of kidney stones   . Open right fibular fracture 12/02/2017  . Pneumonia   . Strabismus    with lazy eye (left)  . Traumatic iritis    Past Surgical History:  Procedure Laterality  Date  . APPLICATION OF WOUND VAC Right 11/29/2017   Procedure: APPLICATION OF WOUND VAC;  Surgeon: Myrene Galas, MD;  Location: [facility] OR;  Service: Orthopedics;  Laterality: Right;  . FACIAL LACERATION REPAIR Left 11/29/2017   Procedure: ear LACERATION REPAIR;  Surgeon: Violeta Gelinas, MD;  Location: [facility] OR;  Service: General;  Laterality: Left;  . I&D EXTREMITY Right 11/29/2017   Procedure: IRRIGATION AND DEBRIDEMENT EXTREMITY;  Surgeon: Myrene Galas, MD;  Location: [facility] OR;  Service: Orthopedics;  Laterality: Right;  . I&D EXTREMITY Right 12/01/2017   Procedure: IRRIGATION AND DEBRIDEMENT RIGHT LOWER EXTREMITY AND VAC CHANGE;  Surgeon: Myrene Galas, MD;  Location: [facility] OR;  Service: Orthopedics;  Laterality: Right;  . INTRAMEDULLARY (IM) NAIL INTERTROCHANTERIC Left 11/29/2017   Procedure: INTRAMEDULLARY (IM) NAIL INTERTROCHANTRIC;  Surgeon: Myrene Galas, MD;  Location: [facility] OR;  Service: Orthopedics;  Laterality: Left;  Marland Kitchen MANDIBULAR HARDWARE REMOVAL N/A 01/06/2018   Procedure: MANDIBULAR HARDWARE REMOVAL;  Surgeon: Suzanna Obey, MD;  Location: [facility] OR;  Service: ENT;  Laterality: N/A;  . ORIF MANDIBULAR FRACTURE N/A 12/01/2017   Procedure: OPEN REDUCTION INTERNAL FIXATION (ORIF) MANDIBULAR FRACTURE;  Surgeon: Suzanna Obey, MD;  Location: [facility] OR;  Service: ENT;   Laterality: N/A;  . TRACHEOSTOMY TUBE PLACEMENT N/A 12/01/2017   Procedure: TRACHEOSTOMY;  Surgeon: Suzanna Obey, MD;  Location: [facility] OR;  Service: ENT;  Laterality: N/A;  . TYMPANOMASTOIDECTOMY Right 12/05/2017   Procedure: right canal up tympanomastoid, right mastoidectomy, exploration right facial nerve, Type 1 tympanoplasty, CSF leak repair.;  Surgeon: Ermalinda Barrios, MD;  Location: [facility] OR;  Service: ENT;  Laterality: Right;    FAMILY HISTORY Family History  Problem Relation Age of Onset  . Diabetes Mother   . Bell's palsy Mother   . Diabetes Maternal Uncle   . Diabetes Paternal Uncle     SOCIAL HISTORY Social History   Tobacco Use  . Smoking status: Former Smoker    Last attempt to quit: 12/21/2016    Years since quitting: 1.0  . Smokeless tobacco: Never Used  Substance Use Topics  . Alcohol use: Yes    Comment: rare  . Drug use: Never         OPHTHALMIC EXAM:  Base Eye Exam    Visual Acuity (Snellen - Linear)      Right Left   Dist [place] 20/800 20/30 -2   Dist ph [place] NI 20/25 -2       Tonometry (Tonopen, 10:26 AM)      Right Left   Pressure 8 12       Pupils      Dark Light Shape React APD   Right 2 1 Round Minimal None   Left 2 1 Round Minimal None       Visual Fields (Counting fingers)      Left Right    Full Full       Extraocular Movement      Right Left    Full, Ortho Full, Ortho       Neuro/Psych    Oriented x3:  Yes   Mood/Affect:  Normal       Dilation    Both eyes:  1.0% Mydriacyl, 2.5% Phenylephrine @ 10:26 AM        Slit Lamp and Fundus Exam    Slit Lamp Exam      Right Left   Lids/Lashes Ptosis; lagophthalmos; no blink Telangiectasia, Meibomian gland dysfunction, Ptosis   Conjunctiva/Sclera inferior scleral show White and quiet   Cornea  3-4+ Punctate epithelial erosions, irregular epithelium paracentral scar   Anterior Chamber Deep and quiet Deep and quiet   Iris Round and dilated Round and dilated   Lens 2+ Nuclear sclerosis, 1+  Cortical cataract 2+ Nuclear sclerosis, 1+ Cortical cataract   Vitreous hazy view old vitreous hemorrhage -- white and settling inferiorly clear       Fundus Exam      Right Left   Disc hazy view perfused, sharp Pink and Sharp   C/D Ratio  0.3   Macula hazy view; grossly flat Flat, Good foveal reflex, No heme or edema   Vessels hazy view; grossly normal Normal   Periphery hazy view Attached    Attached             IMAGING AND PROCEDURES  Imaging and Procedures for @TODAY @  OCT, Retina - OU - Both Eyes       Right Eye Quality was good. Progression has no prior data.   Left Eye Quality was good. Central Foveal Thickness: 279. Progression has no prior data. Findings include normal foveal contour, no SRF, no IRF.   Notes *Images captured and stored on drive  Diagnosis / Impression:  OD: no detail, attached peripherally, ?partial PVD OS: NFP, No IRF/SRF   Clinical management:  See below  Abbreviations: NFP - Normal foveal profile. CME - cystoid macular edema. PED - pigment epithelial detachment. IRF - intraretinal fluid. SRF - subretinal fluid. EZ - ellipsoid zone. ERM - epiretinal membrane. ORA - outer retinal atrophy. ORT - outer retinal tubulation. SRHM - subretinal hyper-reflective material        B-Scan Ultrasound - OD - Right Eye       Quality was good. Findings included vitreous opacities, vitreous hemorrhage.   Notes **Images stored on drive**  Impression: Mobile vitreous opacities consistent with vitreous hemorrhage. No obvious mass or RT/RD.                ASSESSMENT/PLAN:    ICD-10-CM   1. Vitreous hemorrhage of right eye (HCC) H43.11 B-Scan Ultrasound - OD - Right Eye  2. Trauma T14.90XA B-Scan Ultrasound - OD - Right Eye  3. Retinal edema H35.81 OCT, Retina - OU - Both Eyes  4. 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 Vitreous Hemorrhage OD  - likely sustained during traumatic tree injury on 10.15.19  - on exam today - VH white and chronic looking and quite diffuse  - BCVA 20/800  - B-scan ultrasound shows diffuse VH, no obvious RT/RD  - discussed findings and treatment options: observation vs surgery  - recommend observation and advised bed rest and sleeping with head elevated  - VH precautions reviewed -- minimize activities, keep head elevated, avoid ASA/NSAIDs/blood thinners as able  - f/u 2 weeks  3. No retinal edema on exam or OCT  4-6. Right facial nerve injury with paralytic lagophthalmos and exposure keratopathy OD - exposure keratopathy fairly mild -- wife doing good job of keeping eye lubricated with systane gel/ointment - pt still has corneal sensation intact and is able to sense when cornea is dry - continue aggressive lubrication and taping eyelid shut - considering partial tarsorrhaphy and gold weight with ENT -- [facility] - tarsorrhaphy  would impede vitrectomy if needed  7. History of amblyopia OS - pt reports history of patching as child - states baseline vision was ~20/40 OS, but thinks VA has improved since injury to OD - monitor   Ophthalmic Meds Ordered this visit:  No orders of the defined types were placed in this encounter.      Return in about 2 weeks (around 02/01/2018) for F/U VH, DFE, OCT.  There are no Patient Instructions on file for this visit.   Explained the diagnoses, plan, and follow up with the patient and they expressed understanding.  Patient expressed understanding of the importance of proper follow up care.   This document serves as a record of services personally performed by Karie Chimera, MD, PhD. It was created on their behalf by Laurian Brim, OA, an ophthalmic assistant. The creation of this record is the  provider's dictation and/or activities during the visit.    Electronically signed by: Laurian Brim, OA  12.04.19 2:10 PM    Karie Chimera, M.D., Ph.D. Diseases & Surgery of the Retina and Vitreous [facility]  I have reviewed the above documentation for accuracy and completeness, and I agree with the above. Karie Chimera, M.D., Ph.D. 01/18/18 2:10 PM      Abbreviations: M myopia (nearsighted); A astigmatism; H hyperopia (farsighted); P presbyopia; Mrx spectacle prescription;  CTL contact lenses; OD right eye; OS left eye; OU both eyes  XT exotropia; ET esotropia; PEK punctate epithelial keratitis; PEE punctate epithelial erosions; DES dry eye syndrome; MGD meibomian gland dysfunction; ATs artificial tears; PFAT's preservative free artificial tears; NSC nuclear sclerotic cataract; PSC posterior subcapsular cataract; ERM epi-retinal membrane; PVD posterior vitreous detachment; RD retinal detachment; DM diabetes mellitus; DR diabetic retinopathy; NPDR non-proliferative diabetic retinopathy; PDR proliferative diabetic retinopathy; CSME clinically significant macular edema; DME diabetic macular edema; dbh dot blot hemorrhages; CWS cotton wool spot; POAG primary open angle glaucoma; C/D cup-to-disc ratio; HVF humphrey visual field; GVF goldmann visual field; OCT optical coherence tomography; IOP intraocular pressure; BRVO Branch retinal vein occlusion; CRVO central retinal vein occlusion; CRAO central retinal artery occlusion; BRAO branch retinal artery occlusion; RT retinal tear; SB scleral buckle; PPV pars plana vitrectomy; VH Vitreous hemorrhage; PRP panretinal laser photocoagulation; IVK intravitreal kenalog; VMT vitreomacular traction; MH Macular hole;  NVD neovascularization of the disc; NVE neovascularization elsewhere; AREDS age related eye disease study; ARMD age related macular degeneration; POAG primary open angle glaucoma; EBMD epithelial/anterior basement membrane  dystrophy; ACIOL anterior chamber intraocular lens; IOL intraocular lens; PCIOL posterior chamber intraocular lens; Phaco/IOL phacoemulsification with intraocular lens placement; PRK photorefractive keratectomy; LASIK laser assisted in situ keratomileusis; HTN hypertension; DM diabetes mellitus; COPD chronic obstructive pulmonary disease

## 2018-01-24 ENCOUNTER — Encounter: Payer: Worker's Compensation | Admitting: Physical Medicine & Rehabilitation

## 2018-01-24 ENCOUNTER — Telehealth: Payer: Self-pay

## 2018-01-24 NOTE — Telephone Encounter (Signed)
Pt was in today for a f/u visit to see Dr. Riley KillSwartz. Patient was asked to sign an opioid agreement and informed consent. I kindly went over the agreement with him because patient had one eye patched. I get to the part that pt will have to give a UDS today and he said all the curse words and calling me names too, then stormed out of the room.

## 2018-02-01 ENCOUNTER — Inpatient Hospital Stay: Payer: Self-pay | Admitting: Physical Medicine & Rehabilitation

## 2018-02-01 NOTE — Progress Notes (Addendum)
[facility] - Clinic Note  02/02/2018     CHIEF COMPLAINT Patient presents for Post-op Follow-up   HISTORY OF PRESENT ILLNESS: Willie Blevins is a 42 y.o. male who presents to the clinic today for:   HPI    Post-op Follow-up    In left eye.  Discomfort includes pain and floaters.  Negative for itching, foreign body sensation, tearing and discharge.  Vision is stable, is blurred at distance and is blurred at near.  I, the attending physician,  performed the HPI with the patient and updated documentation appropriately.          Comments    F/u for VH OD. Patient states he has eye pain due to his accident, otherwise he demies any changes since last ov. Pt continues to wear eye patch os @hs  and he is using Systane ointment QID OS,Systane gtt's Qid OS and artifical tears PRN       Last edited by Rennis ChrisZamora, Emon Lance, MD on 02/02/2018  9:33 AM. (History)      Referring physician: Aura CampsSpencer, Michael, MD [address] [place], [place] [id number]  HISTORICAL INFORMATION:   Selected notes from the MEDICAL RECORD NUMBER Referred by Dr. Karleen HampshireSpencer LEE:  Ocular Hx- PMH-    CURRENT MEDICATIONS: Current Outpatient Medications (Ophthalmic Drugs)  Medication Sig  . artificial tears (LACRILUBE) OINT ophthalmic ointment Place into the right eye at bedtime and may repeat dose one time if needed.  Cliffton Asters. White Petrolatum-Mineral Oil (STYE) 31.9-57.7 % OINT Place 1 application into the right eye 2 (two) times daily.  . polyvinyl alcohol (LIQUIFILM TEARS) 1.4 % ophthalmic solution Place 2 drops into the right eye every 4 (four) hours. (Patient not taking: Reported on 02/02/2018)   No current facility-administered medications for this visit.  (Ophthalmic Drugs)   Current Outpatient Medications (Other)  Medication Sig  . acetaminophen (TYLENOL) 500 MG tablet Take 1-2 tablets (500-1,000 mg total) by mouth every 8 (eight) hours. (Patient taking differently: Take 500-1,000 mg  by mouth every 8 (eight) hours as needed for mild pain. )  . amitriptyline (ELAVIL) 10 MG tablet Take 1-2 tablets (10-20 mg total) by mouth at bedtime. (Patient taking differently: Take 20 mg by mouth at bedtime. )  . ascorbic acid (VITAMIN C) 1000 MG tablet Take 1 tablet (1,000 mg total) by mouth daily.  . benzocaine (ORAJEL) 10 % mucosal gel Use as directed in the mouth or throat 4 (four) times daily as needed for mouth pain (put a bedside).  . Cholecalciferol (VITAMIN D3) 250 MCG (10000 UT) capsule Take 10,000 Units by mouth daily.  . famotidine (PEPCID) 20 MG tablet Take 1 tablet (20 mg total) by mouth 2 (two) times daily.  Marland Kitchen. gabapentin (NEURONTIN) 600 MG tablet Take 1 tablet (600 mg total) by mouth every 8 (eight) hours.  . hydrOXYzine (ATARAX/VISTARIL) 25 MG tablet Take 1 tablet (25 mg total) by mouth 3 (three) times daily as needed for anxiety.  . lactobacillus (FLORANEX/LACTINEX) PACK Take 1 packet (1 g total) by mouth 3 (three) times daily with meals. (Patient taking differently: Take 1 g by mouth daily. )  . lidocaine (LIDODERM) 5 % Apply 2 patches to ribs at 7 am and remove at 7 pm  . methocarbamol (ROBAXIN) 750 MG tablet Take 1 tablet (750 mg total) by mouth 3 (three) times daily.  Marland Kitchen. mouth rinse LIQD solution 15 mLs by Mouth Rinse route every 4 (four) hours. (Patient taking differently: 15 mLs by  Mouth Rinse route 3 (three) times daily. )  . Multiple Vitamin (MULTIVITAMIN WITH MINERALS) TABS tablet Take 1 tablet by mouth daily.  . Oxycodone HCl 10 MG TABS Take 1 tablet (10 mg total) by mouth every 6 (six) hours as needed (pain).  . polycarbophil (FIBERCON) 625 MG tablet Take 1 tablet (625 mg total) by mouth 2 (two) times daily.  . traZODone (DESYREL) 50 MG tablet Take 0.5-1 tablets (25-50 mg total) by mouth at bedtime as needed for sleep. (Patient taking differently: Take 50 mg by mouth at bedtime as needed for sleep. )  . enoxaparin (LOVENOX) 40 MG/0.4ML injection Inject 0.4 mLs (40 mg  total) into the skin daily. (Patient not taking: Reported on 01/05/2018)   No current facility-administered medications for this visit.  (Other)      REVIEW OF SYSTEMS: ROS    Positive for: Eyes   Negative for: Constitutional, Gastrointestinal, Neurological, Skin, Genitourinary, Musculoskeletal, HENT, Endocrine, Cardiovascular, Respiratory, Psychiatric, Allergic/Imm, Heme/Lymph   Last edited by Eldridge Scot, LPN on 16/11/9602  9:12 AM. (History)       ALLERGIES Allergies  Allergen Reactions  . Penicillins Hives    Has patient had a PCN reaction causing immediate rash, facial/tongue/throat swelling, SOB or lightheadedness with hypotension: Yes Has patient had a PCN reaction causing severe rash involving mucus membranes or skin necrosis: No Has patient had a PCN reaction that required hospitalization: No Has patient had a PCN reaction occurring within the last 10 years: No If all of the above answers are "NO", then may proceed with Cephalosporin use.     PAST MEDICAL HISTORY Past Medical History:  Diagnosis Date  . Anxiety    since accident  . Bullous emphysema (HCC)   . Complication of anesthesia    woke up "swinging"  . COPD (chronic obstructive pulmonary disease) (HCC)   . GERD (gastroesophageal reflux disease)   . History of kidney stones   . Open right fibular fracture 12/02/2017  . Pneumonia   . Strabismus    with lazy eye (left)  . Traumatic iritis    Past Surgical History:  Procedure Laterality Date  . APPLICATION OF WOUND VAC Right 11/29/2017   Procedure: APPLICATION OF WOUND VAC;  Surgeon: Myrene Galas, MD;  Location: [facility] OR;  Service: Orthopedics;  Laterality: Right;  . FACIAL LACERATION REPAIR Left 11/29/2017   Procedure: ear LACERATION REPAIR;  Surgeon: Violeta Gelinas, MD;  Location: [facility] OR;  Service: General;  Laterality: Left;  . I&D EXTREMITY Right 11/29/2017   Procedure: IRRIGATION AND DEBRIDEMENT EXTREMITY;  Surgeon: Myrene Galas, MD;   Location: [facility] OR;  Service: Orthopedics;  Laterality: Right;  . I&D EXTREMITY Right 12/01/2017   Procedure: IRRIGATION AND DEBRIDEMENT RIGHT LOWER EXTREMITY AND VAC CHANGE;  Surgeon: Myrene Galas, MD;  Location: [facility] OR;  Service: Orthopedics;  Laterality: Right;  . INTRAMEDULLARY (IM) NAIL INTERTROCHANTERIC Left 11/29/2017   Procedure: INTRAMEDULLARY (IM) NAIL INTERTROCHANTRIC;  Surgeon: Myrene Galas, MD;  Location: [facility] OR;  Service: Orthopedics;  Laterality: Left;  Marland Kitchen MANDIBULAR HARDWARE REMOVAL N/A 01/06/2018   Procedure: MANDIBULAR HARDWARE REMOVAL;  Surgeon: Suzanna Obey, MD;  Location: [facility] OR;  Service: ENT;  Laterality: N/A;  . ORIF MANDIBULAR FRACTURE N/A 12/01/2017   Procedure: OPEN REDUCTION INTERNAL FIXATION (ORIF) MANDIBULAR FRACTURE;  Surgeon: Suzanna Obey, MD;  Location: [facility] OR;  Service: ENT;  Laterality: N/A;  . TRACHEOSTOMY TUBE PLACEMENT N/A 12/01/2017   Procedure: TRACHEOSTOMY;  Surgeon: Suzanna Obey, MD;  Location: [facility] OR;  Service: ENT;  Laterality:  N/A;  . TYMPANOMASTOIDECTOMY Right 12/05/2017   Procedure: right canal up tympanomastoid, right mastoidectomy, exploration right facial nerve, Type 1 tympanoplasty, CSF leak repair.;  Surgeon: Ermalinda Barrios, MD;  Location: [facility] OR;  Service: ENT;  Laterality: Right;    FAMILY HISTORY Family History  Problem Relation Age of Onset  . Diabetes Mother   . Bell's palsy Mother   . Diabetes Maternal Uncle   . Diabetes Paternal Uncle     SOCIAL HISTORY Social History   Tobacco Use  . Smoking status: Former Smoker    Last attempt to quit: 12/21/2016    Years since quitting: 1.1  . Smokeless tobacco: Never Used  Substance Use Topics  . Alcohol use: Yes    Comment: rare  . Drug use: Never         OPHTHALMIC EXAM:  Base Eye Exam    Visual Acuity (Snellen - Linear)      Right Left   Dist [place] CF 3' 20/40 +2   Dist ph [place] NI NI       Tonometry (Tonopen, 9:11 AM)      Right Left   Pressure 7 7       Pupils      Dark Light  Shape React APD   Right 3 2 Round Minimal None   Left 3 2 Round Minimal None       Visual Fields (Counting fingers)      Left Right    Full    Restrictions  Total superior temporal, inferior temporal, superior nasal, inferior nasal deficiencies       Extraocular Movement      Right Left    Full, Ortho Full, Ortho       Neuro/Psych    Oriented x3:  Yes   Mood/Affect:  Normal       Dilation    Both eyes:  1.0% Mydriacyl, 2.5% Phenylephrine @ 9:11 AM        Slit Lamp and Fundus Exam    Slit Lamp Exam      Right Left   Lids/Lashes Ptosis; lagophthalmos; no blink Telangiectasia, Meibomian gland dysfunction, Ptosis   Conjunctiva/Sclera inferior scleral show White and quiet   Cornea 3-4+ Punctate epithelial erosions, irregular epithelium paracentral scar   Anterior Chamber Deep and quiet Deep and quiet   Iris Round and dilated Round and dilated   Lens 2+ Nuclear sclerosis, 1+ Cortical cataract 2+ Nuclear sclerosis, 1+ Cortical cataract   Vitreous hazy view old vitreous hemorrhage -- diffuse w/ white heme inferiorly -- no significant change from prior clear       Fundus Exam      Right Left   Disc hazy view perfused, sharp Pink and Sharp   C/D Ratio  0.3   Macula hazy view; grossly flat Flat, Good foveal reflex, No heme or edema   Vessels hazy view; grossly normal Normal   Periphery hazy view Attached    Attached             IMAGING AND PROCEDURES  Imaging and Procedures for @TODAY @  OCT, Retina - OU - Both Eyes       Right Eye Quality was poor. Progression has no prior data.   Left Eye Quality was good. Central Foveal Thickness: 274. Progression has been stable. Findings include normal foveal contour, no SRF, no IRF.   Notes *Images captured and stored on drive  Diagnosis / Impression:  OD: no image obtained OS: NFP, No IRF/SRF   Clinical  management:  See below  Abbreviations: NFP - Normal foveal profile. CME - cystoid macular edema. PED - pigment  epithelial detachment. IRF - intraretinal fluid. SRF - subretinal fluid. EZ - ellipsoid zone. ERM - epiretinal membrane. ORA - outer retinal atrophy. ORT - outer retinal tubulation. SRHM - subretinal hyper-reflective material                 ASSESSMENT/PLAN:    ICD-10-CM   1. Vitreous hemorrhage of right eye (HCC) H43.11   2. Trauma T14.90XA   3. Retinal edema H35.81 OCT, Retina - OU - Both Eyes  4. 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 Vitreous Hemorrhage OD  - likely sustained during traumatic tree injury on 10.15.19  - on exam today - VH white and chronic looking and quite diffuse -- no significant change from prior  - BCVA CF 3' -- significantly limited by keratopathy  - B-scan ultrasound 12.4.19 shows diffuse VH, no obvious RT/RD  - discussed findings and treatment options: observation vs surgery  - recommend observation and advised bed rest and sleeping with head elevated  - VH precautions reviewed -- minimize activities, keep head elevated, avoid ASA/NSAIDs/blood thinners as able  - f/u 3 weeks  - Possible surgery in January ~3 mos post injury  - Fax notes to case manager, Ileana Roup [phone]  3. No retinal edema on exam or OCT  4-6. 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 History of amblyopia OS - pt reports history of patching as  child - states baseline vision was ~20/40 OS, but thinks VA has improved since injury to OD - monitor   Ophthalmic Meds Ordered this visit:  No orders of the defined types were placed in this encounter.      Return in about 3 weeks (around 02/23/2018) for Dilated Exam, OCT.  There are no Patient Instructions on file for this visit.   Explained the diagnoses, plan, and follow up with the patient and they expressed understanding.  Patient expressed understanding of the importance of proper follow up care.   This document serves as a record of services personally performed by Karie Chimera, MD, PhD. It was created on their behalf by Laurian Brim, OA, an ophthalmic assistant. The creation of this record is the provider's dictation and/or activities during the visit.    Electronically signed by: Laurian Brim, OA  12.18.19 1:36 PM    Karie Chimera, M.D., Ph.D. Diseases & Surgery of the Retina and Vitreous [facility]  I have reviewed the above documentation for accuracy and completeness, and I agree with the above. Karie Chimera, M.D., Ph.D. 02/03/18 1:36 PM   Abbreviations: M myopia (nearsighted); A astigmatism; H hyperopia (farsighted); P presbyopia; Mrx spectacle prescription;  CTL contact lenses; OD right eye; OS left eye; OU both eyes  XT exotropia; ET esotropia; PEK punctate  epithelial keratitis; PEE punctate epithelial erosions; DES dry eye syndrome; MGD meibomian gland dysfunction; ATs artificial tears; PFAT's preservative free artificial tears; NSC nuclear sclerotic cataract; PSC posterior subcapsular cataract; ERM epi-retinal membrane; PVD posterior vitreous detachment; RD retinal detachment; DM diabetes mellitus; DR diabetic retinopathy; NPDR non-proliferative diabetic retinopathy; PDR proliferative diabetic retinopathy; CSME clinically significant macular edema; DME diabetic macular edema; dbh dot blot hemorrhages; CWS cotton wool spot; POAG primary  open angle glaucoma; C/D cup-to-disc ratio; HVF humphrey visual field; GVF goldmann visual field; OCT optical coherence tomography; IOP intraocular pressure; BRVO Branch retinal vein occlusion; CRVO central retinal vein occlusion; CRAO central retinal artery occlusion; BRAO branch retinal artery occlusion; RT retinal tear; SB scleral buckle; PPV pars plana vitrectomy; VH Vitreous hemorrhage; PRP panretinal laser photocoagulation; IVK intravitreal kenalog; VMT vitreomacular traction; MH Macular hole;  NVD neovascularization of the disc; NVE neovascularization elsewhere; AREDS age related eye disease study; ARMD age related macular degeneration; POAG primary open angle glaucoma; EBMD epithelial/anterior basement membrane dystrophy; ACIOL anterior chamber intraocular lens; IOL intraocular lens; PCIOL posterior chamber intraocular lens; Phaco/IOL phacoemulsification with intraocular lens placement; PRK photorefractive keratectomy; LASIK laser assisted in situ keratomileusis; HTN hypertension; DM diabetes mellitus; COPD chronic obstructive pulmonary disease

## 2018-02-02 ENCOUNTER — Encounter (INDEPENDENT_AMBULATORY_CARE_PROVIDER_SITE_OTHER): Payer: Self-pay | Admitting: Ophthalmology

## 2018-02-02 ENCOUNTER — Ambulatory Visit (INDEPENDENT_AMBULATORY_CARE_PROVIDER_SITE_OTHER): Payer: Worker's Compensation | Admitting: Ophthalmology

## 2018-02-02 DIAGNOSIS — H53002 Unspecified amblyopia, left eye: Secondary | ICD-10-CM

## 2018-02-02 DIAGNOSIS — S0451XS Injury of facial nerve, right side, sequela: Secondary | ICD-10-CM

## 2018-02-02 DIAGNOSIS — H16211 Exposure keratoconjunctivitis, right eye: Secondary | ICD-10-CM

## 2018-02-02 DIAGNOSIS — T1490XA Injury, unspecified, initial encounter: Secondary | ICD-10-CM | POA: Diagnosis not present

## 2018-02-02 DIAGNOSIS — H3581 Retinal edema: Secondary | ICD-10-CM

## 2018-02-02 DIAGNOSIS — H0223A Paralytic lagophthalmos right eye, upper and lower eyelids: Secondary | ICD-10-CM

## 2018-02-02 DIAGNOSIS — H4311 Vitreous hemorrhage, right eye: Secondary | ICD-10-CM | POA: Diagnosis not present

## 2018-02-03 ENCOUNTER — Encounter (INDEPENDENT_AMBULATORY_CARE_PROVIDER_SITE_OTHER): Payer: Self-pay | Admitting: Ophthalmology

## 2018-02-13 ENCOUNTER — Encounter

## 2018-02-13 ENCOUNTER — Ambulatory Visit: Payer: Self-pay | Admitting: Physical Medicine & Rehabilitation

## 2018-02-22 NOTE — Progress Notes (Signed)
[facility] - Clinic Note  02/24/2018     CHIEF COMPLAINT Patient presents for Retina Follow Up   HISTORY OF PRESENT ILLNESS: Willie Blevins is a 43 y.o. male who presents to the clinic today for:   HPI    Retina Follow Up    Patient presents with  Other.  In right eye.  This started 5 weeks ago.  Severity is moderate.  Duration of 3 weeks.  Since onset it is gradually improving.  I, the attending physician,  performed the HPI with the patient and updated documentation appropriately.          Comments    43 y/o male pt here for 3 wk f/u for VH OD.  VA OD gradually improving.  [place] in [place].  Keeping OD taped most of the time.  Has some intermittent pain OD, but thinks may be related to other issues, such as tooth pain.  Pain OD today at about a 2.  Denies flashes, but still has large floaters OD.  Using Systane 5-6 times/day OD and using "ointment" mostly QHS OD, but sometimes when changing tape.       Last edited by Rennis ChrisZamora, Alycen Mack, MD on 02/26/2018  3:42 PM. (History)    pt states he feels like his vision is better, but sometimes a "blob" gets in the way an makes his vision worse  Referring physician: Caroline MoreWaldmann, Jonathan D, MD [address] ZO#[id number] [facility] [address] [place], [place] [id number]  HISTORICAL INFORMATION:   Selected notes from the MEDICAL RECORD NUMBER Referred by Dr. Karleen HampshireSpencer LEE:  Ocular Hx- PMH-    CURRENT MEDICATIONS: Current Outpatient Medications (Ophthalmic Drugs)  Medication Sig  . artificial tears (LACRILUBE) OINT ophthalmic ointment Place into the right eye at bedtime and may repeat dose one time if needed.  . polyvinyl alcohol (LIQUIFILM TEARS) 1.4 % ophthalmic solution Place 2 drops into the right eye every 4 (four) hours. (Patient not taking: Reported on 02/02/2018)  . White Petrolatum-Mineral Oil (STYE) 31.9-57.7 % OINT Place 1 application into the right eye 2 (two) times daily.   No current facility-administered  medications for this visit.  (Ophthalmic Drugs)   Current Outpatient Medications (Other)  Medication Sig  . acetaminophen (TYLENOL) 500 MG tablet Take 1-2 tablets (500-1,000 mg total) by mouth every 8 (eight) hours. (Patient taking differently: Take 500-1,000 mg by mouth every 8 (eight) hours as needed for mild pain. )  . amitriptyline (ELAVIL) 10 MG tablet Take 1-2 tablets (10-20 mg total) by mouth at bedtime. (Patient taking differently: Take 20 mg by mouth at bedtime. )  . ascorbic acid (VITAMIN C) 1000 MG tablet Take 1 tablet (1,000 mg total) by mouth daily.  . benzocaine (ORAJEL) 10 % mucosal gel Use as directed in the mouth or throat 4 (four) times daily as needed for mouth pain (put a bedside).  . Cholecalciferol (VITAMIN D3) 250 MCG (10000 UT) capsule Take 10,000 Units by mouth daily.  Marland Kitchen. enoxaparin (LOVENOX) 40 MG/0.4ML injection Inject 0.4 mLs (40 mg total) into the skin daily. (Patient not taking: Reported on 01/05/2018)  . famotidine (PEPCID) 20 MG tablet Take 1 tablet (20 mg total) by mouth 2 (two) times daily.  Marland Kitchen. gabapentin (NEURONTIN) 600 MG tablet Take 1 tablet (600 mg total) by mouth every 8 (eight) hours.  . hydrOXYzine (ATARAX/VISTARIL) 25 MG tablet Take 1 tablet (25 mg total) by mouth 3 (three) times daily as needed for anxiety.  .Marland Kitchen  lactobacillus (FLORANEX/LACTINEX) PACK Take 1 packet (1 g total) by mouth 3 (three) times daily with meals. (Patient taking differently: Take 1 g by mouth daily. )  . lidocaine (LIDODERM) 5 % Apply 2 patches to ribs at 7 am and remove at 7 pm  . methocarbamol (ROBAXIN) 750 MG tablet Take 1 tablet (750 mg total) by mouth 3 (three) times daily.  Marland Kitchen mouth rinse LIQD solution 15 mLs by Mouth Rinse route every 4 (four) hours. (Patient taking differently: 15 mLs by Mouth Rinse route 3 (three) times daily. )  . Multiple Vitamin (MULTIVITAMIN WITH MINERALS) TABS tablet Take 1 tablet by mouth daily.  . Oxycodone HCl 10 MG TABS Take 1 tablet (10 mg total) by  mouth every 6 (six) hours as needed (pain).  . polycarbophil (FIBERCON) 625 MG tablet Take 1 tablet (625 mg total) by mouth 2 (two) times daily.  . traZODone (DESYREL) 50 MG tablet Take 0.5-1 tablets (25-50 mg total) by mouth at bedtime as needed for sleep. (Patient taking differently: Take 50 mg by mouth at bedtime as needed for sleep. )   No current facility-administered medications for this visit.  (Other)      REVIEW OF SYSTEMS: ROS    Positive for: Eyes   Negative for: Constitutional, Gastrointestinal, Neurological, Skin, Genitourinary, Musculoskeletal, HENT, Endocrine, Cardiovascular, Respiratory, Psychiatric, Allergic/Imm, Heme/Lymph   Last edited by Celine Mans, COA on 02/24/2018  3:21 PM. (History)       ALLERGIES Allergies  Allergen Reactions  . Penicillins Hives    Has patient had a PCN reaction causing immediate rash, facial/tongue/throat swelling, SOB or lightheadedness with hypotension: Yes Has patient had a PCN reaction causing severe rash involving mucus membranes or skin necrosis: No Has patient had a PCN reaction that required hospitalization: No Has patient had a PCN reaction occurring within the last 10 years: No If all of the above answers are "NO", then may proceed with Cephalosporin use.     PAST MEDICAL HISTORY Past Medical History:  Diagnosis Date  . Anxiety    since accident  . Bullous emphysema (HCC)   . Complication of anesthesia    woke up "swinging"  . COPD (chronic obstructive pulmonary disease) (HCC)   . GERD (gastroesophageal reflux disease)   . History of kidney stones   . Open right fibular fracture 12/02/2017  . Pneumonia   . Strabismus    with lazy eye (left)  . Traumatic iritis    Past Surgical History:  Procedure Laterality Date  . APPLICATION OF WOUND VAC Right 11/29/2017   Procedure: APPLICATION OF WOUND VAC;  Surgeon: Myrene Galas, MD;  [facility]: [facility] OR;  Service: Orthopedics;  Laterality: Right;  . FACIAL LACERATION  REPAIR Left 11/29/2017   Procedure: ear LACERATION REPAIR;  Surgeon: Violeta Gelinas, MD;  [facility] OR;  Service: General;  Laterality: Left;  . I&D EXTREMITY Right 11/29/2017   Procedure: IRRIGATION AND DEBRIDEMENT EXTREMITY;  Surgeon: Myrene Galas, MD;  Location: [facility] OR;  Service: Orthopedics;  Laterality: Right;  . I&D EXTREMITY Right 12/01/2017   Procedure: IRRIGATION AND DEBRIDEMENT RIGHT LOWER EXTREMITY AND VAC CHANGE;  Surgeon: Myrene Galas, MD;  Location: [facility] OR;  Service: Orthopedics;  Laterality: Right;  . INTRAMEDULLARY (IM) NAIL INTERTROCHANTERIC Left 11/29/2017   Procedure: INTRAMEDULLARY (IM) NAIL INTERTROCHANTRIC;  Surgeon: Myrene Galas, MD;  Location: [facility] OR;  Service: Orthopedics;  Laterality: Left;  Marland Kitchen MANDIBULAR HARDWARE REMOVAL N/A 01/06/2018   Procedure: MANDIBULAR HARDWARE REMOVAL;  Surgeon: Suzanna Obey, MD;  Location:  [facility] OR;  Service: ENT;  Laterality: N/A;  . ORIF MANDIBULAR FRACTURE N/A 12/01/2017   Procedure: OPEN REDUCTION INTERNAL FIXATION (ORIF) MANDIBULAR FRACTURE;  Surgeon: Suzanna Obey, MD;  Location[facility] OR;  Service: ENT;  Laterality: N/A;  . TRACHEOSTOMY TUBE PLACEMENT N/A 12/01/2017   Procedure: TRACHEOSTOMY;  Surgeon: Suzanna Obey, MD;  Location: [facility] OR;  Service: ENT;  Laterality: N/A;  . TYMPANOMASTOIDECTOMY Right 12/05/2017   Procedure: right canal up tympanomastoid, right mastoidectomy, exploration right facial nerve, Type 1 tympanoplasty, CSF leak repair.;  Surgeon: Ermalinda Barrios, MD;  [facility] OR;  Service: ENT;  Laterality: Right;    FAMILY HISTORY Family History  Problem Relation Age of Onset  . Diabetes Mother   . Bell's palsy Mother   . Diabetes Maternal Uncle   . Diabetes Paternal Uncle     SOCIAL HISTORY Social History   Tobacco Use  . Smoking status: Former Smoker    Last attempt to quit: 12/21/2016    Years since quitting: 1.1  . Smokeless tobacco: Never Used  Substance Use Topics  . Alcohol use: Yes    Comment: rare  . Drug  use: Never         OPHTHALMIC EXAM:  Base Eye Exam    Visual Acuity (Snellen - Linear)      Right Left   Dist [place] 20/150 - 20/40 +2   Dist ph [place] NI NI       Visual Acuity #2 (Snellen - Linear)      Right Left   Dist [place] CF at 2'        Tonometry (Tonopen, 3:25 PM)      Right Left   Pressure 8 8       Pupils      Dark Light Shape React APD   Right 3 2 Round Minimal None   Left 3 2 Round Minimal None       Visual Fields (Counting fingers)      Left Right    Full    Restrictions  Total superior nasal, inferior nasal deficiencies; Partial outer superior temporal, inferior temporal deficiencies       Extraocular Movement      Right Left    Full, Ortho Full, Ortho       Neuro/Psych    Oriented x3:  Yes   Mood/Affect:  Normal       Dilation    Both eyes:  1.0% Mydriacyl, 2.5% Phenylephrine @ 3:25 PM        Slit Lamp and Fundus Exam    Slit Lamp Exam      Right Left   Lids/Lashes Ptosis; lagophthalmos; no blink Telangiectasia, Meibomian gland dysfunction, Ptosis   Conjunctiva/Sclera White and quiet White and quiet   Cornea 3-4+ Punctate epithelial erosions, irregular epithelium paracentral scar   Anterior Chamber Deep and quiet Deep and quiet   Iris Round and dilated Round and dilated   Lens 2+ Nuclear sclerosis, 1+ Cortical cataract 2+ Nuclear sclerosis, 1+ Cortical cataract   Vitreous hazy view old vitreous hemorrhage -- diffuse w/ white heme inferiorly -- no significant change from prior clear       Fundus Exam      Right Left   Disc hazy view, perfused, sharp Pink and Sharp   C/D Ratio  0.3   Macula hazy view; grossly flat Flat, Good foveal reflex, No heme or edema   Vessels hazy view; grossly normal peripherally  Normal   Periphery hazy view, Attached, central  white clot inferiorly Attached             IMAGING AND PROCEDURES  Imaging and Procedures for @TODAY @  OCT, Retina - OU - Both Eyes       Right Eye Quality was poor. Progression has  no prior data.   Left Eye Quality was good. Central Foveal Thickness: 276. Progression has been stable. Findings include normal foveal contour, no SRF, no IRF.   Notes *Images captured and stored on drive  Diagnosis / Impression:  OD: no image obtained OS: NFP, No IRF/SRF   Clinical management:  See below  Abbreviations: NFP - Normal foveal profile. CME - cystoid macular edema. PED - pigment epithelial detachment. IRF - intraretinal fluid. SRF - subretinal fluid. EZ - ellipsoid zone. ERM - epiretinal membrane. ORA - outer retinal atrophy. ORT - outer retinal tubulation. SRHM - subretinal hyper-reflective material                 ASSESSMENT/PLAN:    ICD-10-CM   1. Vitreous hemorrhage of right eye (HCC) H43.11   2. Trauma T14.90XA   3. Retinal edema H35.81 OCT, Retina - OU - Both Eyes  4. 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 Vitreous Hemorrhage OD  - likely sustained during traumatic tree injury on 10.15.19  - on exam today - VH white and chronic looking and quite dense, diffuse -- no significant improvement from prior exams  - BCVA 20/150 and then CF 2' on MD recheck -- significantly limited by keratopathy  - B-scan ultrasound 12.4.19 shows diffuse VH, no obvious RT/RD  - discussed findings and treatment options: observation vs surgery  - as there is significantly dense heme 3 mos post injury, recommend PPV w/ laser OD under general anesthesia  - RBA of procedure discussed, questions answered  - specifically discussed guarded prognosis due to inability to see what retina looks like behind  Vitreous hemorrhage-- surgery may reveal damage to underlying retina  - informed consent obtained and signed  - VH precautions reviewed -- minimize activities, keep head elevated, avoid ASA/NSAIDs/blood thinners as  able  - [facility]  - will need medical clearance from PCP  - f/u on POD1 -- 03/10/18  - Fax notes to case manager, Ileana RoupAmy Pearson [phone]  3. No retinal edema on exam or OCT  4-6. 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 History of amblyopia OS - pt reports history of patching as child - states baseline vision was ~20/40 OS, but thinks VA has improved since injury to OD - monitor   Ophthalmic Meds Ordered this visit:  No orders of the defined types were placed in this encounter.      Return in about 2 weeks (around 03/10/2018) for POV.  There are no Patient Instructions on file for this visit.   Explained the diagnoses, plan,  and follow up with the patient and they expressed understanding.  Patient expressed understanding of the importance of proper follow up care.   This document serves as a record of services personally performed by Karie Chimera, MD, PhD. It was created on their behalf by Laurian Brim, OA, an ophthalmic assistant. The creation of this record is the provider's dictation and/or activities during the visit.    Electronically signed by: Laurian Brim, OA  01.10.2020 3:45 PM    Karie Chimera, M.D., Ph.D. Diseases & Surgery of the Retina and Vitreous [facility]  I have reviewed the above documentation for accuracy and completeness, and I agree with the above. Karie Chimera, M.D., Ph.D. 02/26/18 3:45 PM   Abbreviations: M myopia  (nearsighted); A astigmatism; H hyperopia (farsighted); P presbyopia; Mrx spectacle prescription;  CTL contact lenses; OD right eye; OS left eye; OU both eyes  XT exotropia; ET esotropia; PEK punctate epithelial keratitis; PEE punctate epithelial erosions; DES dry eye syndrome; MGD meibomian gland dysfunction; ATs artificial tears; PFAT's preservative free artificial tears; NSC nuclear sclerotic cataract; PSC posterior subcapsular cataract; ERM epi-retinal membrane; PVD posterior vitreous detachment; RD retinal detachment; DM diabetes mellitus; DR diabetic retinopathy; NPDR non-proliferative diabetic retinopathy; PDR proliferative diabetic retinopathy; CSME clinically significant macular edema; DME diabetic macular edema; dbh dot blot hemorrhages; CWS cotton wool spot; POAG primary open angle glaucoma; C/D cup-to-disc ratio; HVF humphrey visual field; GVF goldmann visual field; OCT optical coherence tomography; IOP intraocular pressure; BRVO Branch retinal vein occlusion; CRVO central retinal vein occlusion; CRAO central retinal artery occlusion; BRAO branch retinal artery occlusion; RT retinal tear; SB scleral buckle; PPV pars plana vitrectomy; VH Vitreous hemorrhage; PRP panretinal laser photocoagulation; IVK intravitreal kenalog; VMT vitreomacular traction; MH Macular hole;  NVD neovascularization of the disc; NVE neovascularization elsewhere; AREDS age related eye disease study; ARMD age related macular degeneration; POAG primary open angle glaucoma; EBMD epithelial/anterior basement membrane dystrophy; ACIOL anterior chamber intraocular lens; IOL intraocular lens; PCIOL posterior chamber intraocular lens; Phaco/IOL phacoemulsification with intraocular lens placement; PRK photorefractive keratectomy; LASIK laser assisted in situ keratomileusis; HTN hypertension; DM diabetes mellitus; COPD chronic obstructive pulmonary disease

## 2018-02-24 ENCOUNTER — Encounter (INDEPENDENT_AMBULATORY_CARE_PROVIDER_SITE_OTHER): Payer: Self-pay | Admitting: Ophthalmology

## 2018-02-24 ENCOUNTER — Ambulatory Visit (INDEPENDENT_AMBULATORY_CARE_PROVIDER_SITE_OTHER): Payer: Worker's Compensation | Admitting: Ophthalmology

## 2018-02-24 DIAGNOSIS — S0451XS Injury of facial nerve, right side, sequela: Secondary | ICD-10-CM | POA: Diagnosis not present

## 2018-02-24 DIAGNOSIS — H3581 Retinal edema: Secondary | ICD-10-CM

## 2018-02-24 DIAGNOSIS — H16211 Exposure keratoconjunctivitis, right eye: Secondary | ICD-10-CM

## 2018-02-24 DIAGNOSIS — H4311 Vitreous hemorrhage, right eye: Secondary | ICD-10-CM | POA: Diagnosis not present

## 2018-02-24 DIAGNOSIS — H53002 Unspecified amblyopia, left eye: Secondary | ICD-10-CM

## 2018-02-24 DIAGNOSIS — T1490XA Injury, unspecified, initial encounter: Secondary | ICD-10-CM | POA: Diagnosis not present

## 2018-02-24 DIAGNOSIS — H0223A Paralytic lagophthalmos right eye, upper and lower eyelids: Secondary | ICD-10-CM

## 2018-02-26 ENCOUNTER — Encounter (INDEPENDENT_AMBULATORY_CARE_PROVIDER_SITE_OTHER): Payer: Self-pay | Admitting: Ophthalmology

## 2018-03-02 ENCOUNTER — Ambulatory Visit: Payer: Self-pay | Admitting: Psychology

## 2018-03-07 NOTE — H&P (Signed)
Willie Blevins is an 43 y.o. male.    Chief Complaint: decreased vision, OD  HPI: Pt with history of traumatic crush injury from a fallen tree and long standing amblyopia OS. Following injury, complained of decreased vision OD and on dilated exam found to have a non-clearing vitreous hemorrhage OD.  Past Medical History:  Diagnosis Date  . Anxiety    since accident  . Bullous emphysema (HCC)   . Complication of anesthesia    woke up "swinging"  . COPD (chronic obstructive pulmonary disease) (HCC)   . GERD (gastroesophageal reflux disease)   . History of kidney stones   . Open right fibular fracture 12/02/2017  . Pneumonia   . Strabismus    with lazy eye (left)  . Traumatic iritis     Past Surgical History:  Procedure Laterality Date  . APPLICATION OF WOUND VAC Right 11/29/2017   Procedure: APPLICATION OF WOUND VAC;  Surgeon: Myrene GalasHandy, Michael, MD;  [facility]: [facility] OR;  Service: Orthopedics;  Laterality: Right;  . FACIAL LACERATION REPAIR Left 11/29/2017   Procedure: ear LACERATION REPAIR;  Surgeon: Violeta Gelinashompson, Burke, MD;  [facility];  Service: General;  Laterality: Left;  . I&D EXTREMITY Right 11/29/2017   Procedure: IRRIGATION AND DEBRIDEMENT EXTREMITY;  Surgeon: Myrene GalasHandy, Michael, MD;  [facility];  Service: Orthopedics;  Laterality: Right;  . I&D EXTREMITY Right 12/01/2017   Procedure: IRRIGATION AND DEBRIDEMENT RIGHT LOWER EXTREMITY AND VAC CHANGE;  Surgeon: Myrene GalasHandy, Michael, MD;  Location: [facility] OR;  Service: Orthopedics;  Laterality: Right;  . INTRAMEDULLARY (IM) NAIL INTERTROCHANTERIC Left 11/29/2017   Procedure: INTRAMEDULLARY (IM) NAIL INTERTROCHANTRIC;  Surgeon: Myrene GalasHandy, Michael, MD;  Location: [facility] OR;  Service: Orthopedics;  Laterality: Left;  Marland Kitchen. MANDIBULAR HARDWARE REMOVAL N/A 01/06/2018   Procedure: MANDIBULAR HARDWARE REMOVAL;  Surgeon: Suzanna ObeyByers, John, MD;  [facility];  Service: ENT;  Laterality: N/A;  . ORIF MANDIBULAR FRACTURE N/A 12/01/2017   Procedure: OPEN REDUCTION  INTERNAL FIXATION (ORIF) MANDIBULAR FRACTURE;  Surgeon: Suzanna ObeyByers, John, MD;  [facility];  Service: ENT;  Laterality: N/A;  . TRACHEOSTOMY TUBE PLACEMENT N/A 12/01/2017   Procedure: TRACHEOSTOMY;  Surgeon: Suzanna ObeyByers, John, MD;  [facility];  Service: ENT;  Laterality: N/A;  . TYMPANOMASTOIDECTOMY Right 12/05/2017   Procedure: right canal up tympanomastoid, right mastoidectomy, exploration right facial nerve, Type 1 tympanoplasty, CSF leak repair.;  Surgeon: Ermalinda BarriosKraus, Eric, MD;  [facility];  Service: ENT;  Laterality: Right;    Family History  Problem Relation Age of Onset  . Diabetes Mother   . Bell's palsy Mother   . Diabetes Maternal Uncle   . Diabetes Paternal Uncle    Social History:  reports that he quit smoking about 14 months ago. He has never used smokeless tobacco. He reports current alcohol use. He reports that he does not use drugs.  Allergies:  Allergies  Allergen Reactions  . Penicillins Hives    Has patient had a PCN reaction causing immediate rash, facial/tongue/throat swelling, SOB or lightheadedness with hypotension: Yes Has patient had a PCN reaction causing severe rash involving mucus membranes or skin necrosis: No Has patient had a PCN reaction that required hospitalization: No Has patient had a PCN reaction occurring within the last 10 years: No If all of the above answers are "NO", then may proceed with Cephalosporin use.     No medications prior to admission.    Review of systems otherwise negative  There were no vitals taken for this visit.  Physical exam: Mental  status: oriented x3. Eyes: See eye exam associated with this date of surgery Ears, Nose, Throat: within normal limits Neck: Within Normal limits General: within normal limits Chest: Within normal limits Breast: deferred Heart: Within normal limits Abdomen: Within normal limits GU: deferred Extremities: within normal limits Skin: within normal limits  Assessment/Plan 1. Non-clearing  vitreous hemorrhage, right eye Plan: [facility] 25g PPV w/ endolaser OD, under general anesthesia - [facility]  Karie ChimeraBrian G. Yurianna Tusing, M.D., Ph.D. Vitreoretinal Surgeon [facility]

## 2018-03-08 ENCOUNTER — Encounter (HOSPITAL_COMMUNITY): Payer: Self-pay | Admitting: *Deleted

## 2018-03-08 ENCOUNTER — Other Ambulatory Visit: Payer: Self-pay

## 2018-03-08 NOTE — Progress Notes (Signed)
SDW-pre-op call completed by pt spouse Marcelino Duster. Spouse denies that pt C/O SOB and chest pain. Spouse denies that pt is under the care of a cardiologist. Spouse denies that pt had a stress test, echo and cardiac cath. Spouse denies that pt has an EKG within the last year. Sopuse denies recent labs. Spouse made aware to have pt stop taking vitamins, fish oil, Melatonin and herbal medications. Do not take any NSAIDs ie: Ibuprofen, Advil, Naproxen (Aleve), Motrin, BC and Goody Powder. Spouse verbalized understanding of all pre-op instructions.

## 2018-03-09 ENCOUNTER — Ambulatory Visit (HOSPITAL_COMMUNITY)
Admission: RE | Admit: 2018-03-09 | Discharge: 2018-03-09 | Disposition: A | Discharge status: Home / Self Care | Payer: Worker's Compensation | Attending: Ophthalmology | Admitting: Ophthalmology

## 2018-03-09 ENCOUNTER — Encounter (HOSPITAL_COMMUNITY): Payer: Self-pay | Admitting: Anesthesiology

## 2018-03-09 ENCOUNTER — Ambulatory Visit (HOSPITAL_COMMUNITY): Payer: Worker's Compensation | Admitting: Certified Registered Nurse Anesthetist

## 2018-03-09 ENCOUNTER — Encounter (HOSPITAL_COMMUNITY)
Admission: RE | Disposition: A | Discharge status: Home / Self Care | Payer: Self-pay | Source: Home / Self Care | Attending: Ophthalmology

## 2018-03-09 DIAGNOSIS — Z87891 Personal history of nicotine dependence: Secondary | ICD-10-CM | POA: Insufficient documentation

## 2018-03-09 DIAGNOSIS — Z79899 Other long term (current) drug therapy: Secondary | ICD-10-CM | POA: Insufficient documentation

## 2018-03-09 DIAGNOSIS — K219 Gastro-esophageal reflux disease without esophagitis: Secondary | ICD-10-CM | POA: Insufficient documentation

## 2018-03-09 DIAGNOSIS — H4311 Vitreous hemorrhage, right eye: Secondary | ICD-10-CM | POA: Insufficient documentation

## 2018-03-09 DIAGNOSIS — H33301 Unspecified retinal break, right eye: Secondary | ICD-10-CM | POA: Diagnosis not present

## 2018-03-09 DIAGNOSIS — Z87442 Personal history of urinary calculi: Secondary | ICD-10-CM | POA: Diagnosis not present

## 2018-03-09 DIAGNOSIS — F419 Anxiety disorder, unspecified: Secondary | ICD-10-CM | POA: Insufficient documentation

## 2018-03-09 DIAGNOSIS — E119 Type 2 diabetes mellitus without complications: Secondary | ICD-10-CM | POA: Insufficient documentation

## 2018-03-09 DIAGNOSIS — J449 Chronic obstructive pulmonary disease, unspecified: Secondary | ICD-10-CM | POA: Diagnosis not present

## 2018-03-09 HISTORY — DX: Vitreous hemorrhage, right eye: H43.11

## 2018-03-09 HISTORY — PX: PHOTOCOAGULATION WITH LASER: SHX6027

## 2018-03-09 HISTORY — PX: AIR/FLUID EXCHANGE: SHX6494

## 2018-03-09 HISTORY — PX: PARS PLANA VITRECTOMY: SHX2166

## 2018-03-09 LAB — HEMOGLOBIN: Hemoglobin: 15.3 g/dL (ref 13.0–17.0)

## 2018-03-09 SURGERY — PARS PLANA VITRECTOMY WITH 25 GAUGE
Anesthesia: General | Site: Eye | Laterality: Right

## 2018-03-09 MED ORDER — DORZOLAMIDE HCL-TIMOLOL MAL 2-0.5 % OP SOLN
OPHTHALMIC | Status: AC
  Filled 2018-03-09: qty 10

## 2018-03-09 MED ORDER — GATIFLOXACIN 0.5 % OP SOLN OPTIME - NO CHARGE
OPHTHALMIC | Status: DC | PRN
  Administered 2018-03-09: 1 [drp] via OPHTHALMIC

## 2018-03-09 MED ORDER — PREDNISOLONE ACETATE 1 % OP SUSP
OPHTHALMIC | Status: DC | PRN
  Administered 2018-03-09: 1 [drp] via OPHTHALMIC

## 2018-03-09 MED ORDER — BSS PLUS IO SOLN
INTRAOCULAR | Status: AC
  Filled 2018-03-09: qty 500

## 2018-03-09 MED ORDER — LIDOCAINE 2% (20 MG/ML) 5 ML SYRINGE
INTRAMUSCULAR | Status: DC | PRN
  Administered 2018-03-09: 60 mg via INTRAVENOUS

## 2018-03-09 MED ORDER — BALANCED SALT IO SOLN
INTRAOCULAR | Status: DC | PRN
  Administered 2018-03-09: 15 mL via INTRAOCULAR

## 2018-03-09 MED ORDER — MIDAZOLAM HCL 5 MG/5ML IJ SOLN
INTRAMUSCULAR | Status: DC | PRN
  Administered 2018-03-09: 2 mg via INTRAVENOUS

## 2018-03-09 MED ORDER — TROPICAMIDE 1 % OP SOLN
1.0000 [drp] | OPHTHALMIC | Status: AC | PRN
  Administered 2018-03-09 (×3): 1 [drp] via OPHTHALMIC
  Filled 2018-03-09: qty 15

## 2018-03-09 MED ORDER — HYDROMORPHONE HCL 1 MG/ML IJ SOLN
0.2500 mg | INTRAMUSCULAR | Status: DC | PRN
  Administered 2018-03-09: 0.5 mg via INTRAVENOUS

## 2018-03-09 MED ORDER — SODIUM CHLORIDE 0.9 % IV SOLN
INTRAVENOUS | Status: DC | PRN
  Administered 2018-03-09: 20 ug/min via INTRAVENOUS

## 2018-03-09 MED ORDER — ONDANSETRON HCL 4 MG/2ML IJ SOLN
INTRAMUSCULAR | Status: AC
  Filled 2018-03-09: qty 4

## 2018-03-09 MED ORDER — MIDAZOLAM HCL 2 MG/2ML IJ SOLN
INTRAMUSCULAR | Status: AC
  Filled 2018-03-09: qty 2

## 2018-03-09 MED ORDER — CEFTAZIDIME 1 G IJ SOLR
INTRAMUSCULAR | Status: AC
  Filled 2018-03-09: qty 1

## 2018-03-09 MED ORDER — POLYMYXIN B SULFATE 500000 UNITS IJ SOLR
INTRAMUSCULAR | Status: AC
  Filled 2018-03-09: qty 500000

## 2018-03-09 MED ORDER — ATROPINE SULFATE 1 % OP SOLN
1.0000 [drp] | OPHTHALMIC | Status: AC | PRN
  Administered 2018-03-09 (×3): 1 [drp] via OPHTHALMIC
  Filled 2018-03-09: qty 2

## 2018-03-09 MED ORDER — TRIAMCINOLONE ACETONIDE 40 MG/ML IJ SUSP
INTRAMUSCULAR | Status: AC
  Filled 2018-03-09: qty 5

## 2018-03-09 MED ORDER — ONDANSETRON HCL 4 MG/2ML IJ SOLN: 4.0000 mg | Freq: Once | INTRAMUSCULAR | Status: DC | PRN

## 2018-03-09 MED ORDER — NA CHONDROIT SULF-NA HYALURON 40-30 MG/ML IO SOLN
INTRAOCULAR | Status: DC | PRN
  Administered 2018-03-09: 0.5 mL via INTRAOCULAR

## 2018-03-09 MED ORDER — ATROPINE SULFATE 1 % OP SOLN
OPHTHALMIC | Status: AC
  Filled 2018-03-09: qty 5

## 2018-03-09 MED ORDER — EPINEPHRINE PF 1 MG/ML IJ SOLN
INTRAOCULAR | Status: DC | PRN
  Administered 2018-03-09: 500 mL

## 2018-03-09 MED ORDER — GATIFLOXACIN 0.5 % OP SOLN
OPHTHALMIC | Status: AC
  Filled 2018-03-09: qty 2.5

## 2018-03-09 MED ORDER — BRIMONIDINE TARTRATE 0.2 % OP SOLN
OPHTHALMIC | Status: AC
  Filled 2018-03-09: qty 5

## 2018-03-09 MED ORDER — PROMETHAZINE HCL 25 MG/ML IJ SOLN: 6.2500 mg | INTRAMUSCULAR | Status: DC | PRN

## 2018-03-09 MED ORDER — ROCURONIUM BROMIDE 50 MG/5ML IV SOSY
PREFILLED_SYRINGE | INTRAVENOUS | Status: AC
  Filled 2018-03-09: qty 10

## 2018-03-09 MED ORDER — LIDOCAINE 2% (20 MG/ML) 5 ML SYRINGE
INTRAMUSCULAR | Status: AC
  Filled 2018-03-09: qty 5

## 2018-03-09 MED ORDER — CARBACHOL 0.01 % IO SOLN
INTRAOCULAR | Status: AC
  Filled 2018-03-09: qty 1.5

## 2018-03-09 MED ORDER — DEXAMETHASONE SODIUM PHOSPHATE 10 MG/ML IJ SOLN
INTRAMUSCULAR | Status: DC | PRN
  Administered 2018-03-09: 10 mg via INTRAVENOUS

## 2018-03-09 MED ORDER — DEXAMETHASONE SODIUM PHOSPHATE 10 MG/ML IJ SOLN
INTRAMUSCULAR | Status: AC
  Filled 2018-03-09: qty 2

## 2018-03-09 MED ORDER — ONDANSETRON HCL 4 MG/2ML IJ SOLN
INTRAMUSCULAR | Status: DC | PRN
  Administered 2018-03-09: 4 mg via INTRAVENOUS

## 2018-03-09 MED ORDER — 0.9 % SODIUM CHLORIDE (POUR BTL) OPTIME
TOPICAL | Status: DC | PRN
  Administered 2018-03-09: 1000 mL

## 2018-03-09 MED ORDER — EPINEPHRINE PF 1 MG/ML IJ SOLN
INTRAMUSCULAR | Status: AC
  Filled 2018-03-09: qty 1

## 2018-03-09 MED ORDER — SODIUM CHLORIDE (PF) 0.9 % IJ SOLN
INTRAMUSCULAR | Status: AC
  Filled 2018-03-09: qty 10

## 2018-03-09 MED ORDER — FENTANYL CITRATE (PF) 250 MCG/5ML IJ SOLN
INTRAMUSCULAR | Status: AC
  Filled 2018-03-09: qty 5

## 2018-03-09 MED ORDER — MEPERIDINE HCL 50 MG/ML IJ SOLN: 6.2500 mg | INTRAMUSCULAR | Status: DC | PRN

## 2018-03-09 MED ORDER — PHENYLEPHRINE 40 MCG/ML (10ML) SYRINGE FOR IV PUSH (FOR BLOOD PRESSURE SUPPORT)
PREFILLED_SYRINGE | INTRAVENOUS | Status: DC | PRN
  Administered 2018-03-09 (×2): 80 ug via INTRAVENOUS

## 2018-03-09 MED ORDER — ROCURONIUM BROMIDE 10 MG/ML (PF) SYRINGE
PREFILLED_SYRINGE | INTRAVENOUS | Status: DC | PRN
  Administered 2018-03-09: 50 mg via INTRAVENOUS

## 2018-03-09 MED ORDER — SODIUM CHLORIDE 0.9 % IV SOLN
INTRAVENOUS | Status: DC | PRN
  Administered 2018-03-09: 11:00:00 via INTRAVENOUS

## 2018-03-09 MED ORDER — BACITRACIN-POLYMYXIN B 500-10000 UNIT/GM OP OINT
TOPICAL_OINTMENT | OPHTHALMIC | Status: AC
  Filled 2018-03-09: qty 3.5

## 2018-03-09 MED ORDER — PROPOFOL 10 MG/ML IV BOLUS
INTRAVENOUS | Status: DC | PRN
  Administered 2018-03-09: 140 mg via INTRAVENOUS

## 2018-03-09 MED ORDER — NA CHONDROIT SULF-NA HYALURON 40-30 MG/ML IO SOLN
INTRAOCULAR | Status: AC
  Filled 2018-03-09: qty 1

## 2018-03-09 MED ORDER — STERILE WATER FOR IRRIGATION IR SOLN
Status: DC | PRN
  Administered 2018-03-09: 1000 mL

## 2018-03-09 MED ORDER — PROPOFOL 10 MG/ML IV BOLUS
INTRAVENOUS | Status: AC
  Filled 2018-03-09: qty 20

## 2018-03-09 MED ORDER — PREDNISOLONE ACETATE 1 % OP SUSP
OPHTHALMIC | Status: AC
  Filled 2018-03-09: qty 5

## 2018-03-09 MED ORDER — FENTANYL CITRATE (PF) 250 MCG/5ML IJ SOLN
INTRAMUSCULAR | Status: DC | PRN
  Administered 2018-03-09: 100 ug via INTRAVENOUS

## 2018-03-09 MED ORDER — SUCCINYLCHOLINE CHLORIDE 200 MG/10ML IV SOSY
PREFILLED_SYRINGE | INTRAVENOUS | Status: AC
  Filled 2018-03-09: qty 10

## 2018-03-09 MED ORDER — DEXAMETHASONE SODIUM PHOSPHATE 10 MG/ML IJ SOLN
INTRAMUSCULAR | Status: AC
  Filled 2018-03-09: qty 1

## 2018-03-09 MED ORDER — SUGAMMADEX SODIUM 200 MG/2ML IV SOLN
INTRAVENOUS | Status: DC | PRN
  Administered 2018-03-09: 200 mg via INTRAVENOUS

## 2018-03-09 MED ORDER — STERILE WATER FOR INJECTION IJ SOLN
INTRAMUSCULAR | Status: AC
  Filled 2018-03-09: qty 10

## 2018-03-09 MED ORDER — BSS IO SOLN
INTRAOCULAR | Status: AC
  Filled 2018-03-09: qty 15

## 2018-03-09 MED ORDER — TRIAMCINOLONE ACETONIDE 40 MG/ML IJ SUSP
INTRAMUSCULAR | Status: DC | PRN
  Administered 2018-03-09: 40 mg via INTRAMUSCULAR

## 2018-03-09 MED ORDER — HYDROMORPHONE HCL 1 MG/ML IJ SOLN
INTRAMUSCULAR | Status: AC
  Filled 2018-03-09: qty 1

## 2018-03-09 MED ORDER — BACITRACIN-POLYMYXIN B 500-10000 UNIT/GM OP OINT
TOPICAL_OINTMENT | OPHTHALMIC | Status: DC | PRN
  Administered 2018-03-09: 1 via OPHTHALMIC

## 2018-03-09 MED ORDER — ATROPINE SULFATE 1 % OP SOLN
OPHTHALMIC | Status: DC | PRN
  Administered 2018-03-09: 1 [drp] via OPHTHALMIC

## 2018-03-09 MED ORDER — DORZOLAMIDE HCL-TIMOLOL MAL 2-0.5 % OP SOLN
OPHTHALMIC | Status: DC | PRN
  Administered 2018-03-09: 1 [drp] via OPHTHALMIC

## 2018-03-09 MED ORDER — PHENYLEPHRINE HCL 10 % OP SOLN
1.0000 [drp] | OPHTHALMIC | Status: AC | PRN
  Administered 2018-03-09 (×3): 1 [drp] via OPHTHALMIC
  Filled 2018-03-09: qty 5

## 2018-03-09 MED ORDER — PROPARACAINE HCL 0.5 % OP SOLN
1.0000 [drp] | OPHTHALMIC | Status: AC | PRN
  Administered 2018-03-09 (×3): 1 [drp] via OPHTHALMIC
  Filled 2018-03-09: qty 15

## 2018-03-09 MED ORDER — BRIMONIDINE TARTRATE 0.2 % OP SOLN
OPHTHALMIC | Status: DC | PRN
  Administered 2018-03-09: 1 [drp] via OPHTHALMIC

## 2018-03-09 MED ORDER — PHENYLEPHRINE 40 MCG/ML (10ML) SYRINGE FOR IV PUSH (FOR BLOOD PRESSURE SUPPORT)
PREFILLED_SYRINGE | INTRAVENOUS | Status: AC
  Filled 2018-03-09: qty 10

## 2018-03-09 MED ORDER — STERILE WATER FOR INJECTION IJ SOLN
INTRAMUSCULAR | Status: DC | PRN
  Administered 2018-03-09: 20 mL

## 2018-03-09 SURGICAL SUPPLY — 36 items
APPLICATOR COTTON TIP 6 STRL (MISCELLANEOUS) ×4 IMPLANT
APPLICATOR COTTON TIP 6IN STRL (MISCELLANEOUS) ×12
BNDG EYE OVAL (GAUZE/BANDAGES/DRESSINGS) ×3 IMPLANT
CABLE BIPOLOR RESECTION CORD (MISCELLANEOUS) ×3 IMPLANT
CANNULA FLEX TIP 25G (CANNULA) ×3 IMPLANT
CLOSURE STERI-STRIP 1/2X4 (GAUZE/BANDAGES/DRESSINGS) ×1
CLSR STERI-STRIP ANTIMIC 1/2X4 (GAUZE/BANDAGES/DRESSINGS) ×2 IMPLANT
DRAPE MICROSCOPE LEICA 46X105 (MISCELLANEOUS) ×3 IMPLANT
DRAPE OPHTHALMIC 77X100 STRL (CUSTOM PROCEDURE TRAY) ×3 IMPLANT
ERASER HMR WETFIELD 23G BP (MISCELLANEOUS) IMPLANT
FILTER BLUE MILLIPORE (MISCELLANEOUS) ×3 IMPLANT
GLOVE BIO SURGEON STRL SZ7.5 (GLOVE) ×6 IMPLANT
GLOVE BIOGEL M 7.0 STRL (GLOVE) ×3 IMPLANT
GOWN STRL REUS W/ TWL LRG LVL3 (GOWN DISPOSABLE) ×2 IMPLANT
GOWN STRL REUS W/ TWL XL LVL3 (GOWN DISPOSABLE) ×1 IMPLANT
GOWN STRL REUS W/TWL LRG LVL3 (GOWN DISPOSABLE) ×4
GOWN STRL REUS W/TWL XL LVL3 (GOWN DISPOSABLE) ×2
KIT BASIN OR (CUSTOM PROCEDURE TRAY) ×3 IMPLANT
NEEDLE 18GX1X1/2 (RX/OR ONLY) (NEEDLE) ×3 IMPLANT
NEEDLE 25GX 5/8IN NON SAFETY (NEEDLE) ×9 IMPLANT
NEEDLE HYPO 30X.5 LL (NEEDLE) ×6 IMPLANT
NS IRRIG 1000ML POUR BTL (IV SOLUTION) ×3 IMPLANT
PACK VITRECTOMY CUSTOM (CUSTOM PROCEDURE TRAY) ×3 IMPLANT
PAD ARMBOARD 7.5X6 YLW CONV (MISCELLANEOUS) ×6 IMPLANT
PAK PIK VITRECTOMY CVS 25GA (OPHTHALMIC) ×3 IMPLANT
PENCIL BIPOLAR 25GA STR DISP (OPHTHALMIC RELATED) ×3 IMPLANT
PROBE ENDO DIATHERMY 25G (MISCELLANEOUS) ×3 IMPLANT
PROBE LASER ILLUM FLEX CVD 25G (OPHTHALMIC) ×3 IMPLANT
SUT VICRYL 7 0 TG140 8 (SUTURE) ×3 IMPLANT
SYR 10ML LL (SYRINGE) ×6 IMPLANT
SYR 5ML LL (SYRINGE) ×3 IMPLANT
SYR BULB 3OZ (MISCELLANEOUS) ×3 IMPLANT
SYR TB 1ML LUER SLIP (SYRINGE) ×6 IMPLANT
TOWEL NATURAL 6PK STERILE (DISPOSABLE) ×3 IMPLANT
TUBING HIGH PRESS EXTEN 6IN (TUBING) ×3 IMPLANT
WATER STERILE IRR 1000ML POUR (IV SOLUTION) ×3 IMPLANT

## 2018-03-09 NOTE — Interval H&P Note (Signed)
History and Physical Interval Note:  03/09/2018 10:54 AM  Willie Blevins  has presented today for surgery, with the diagnosis of vitreous hemorrhage right eye  The various methods of treatment have been discussed with the patient and family. After consideration of risks, benefits and other options for treatment, the patient has consented to  Procedure(s): PARS PLANA VITRECTOMY WITH 25 GAUGE (Right) as a surgical intervention .  The patient's history has been reviewed, patient examined, no change in status, stable for surgery.  I have reviewed the patient's chart and labs.  Questions were answered to the patient's satisfaction.     Willie Blevins

## 2018-03-09 NOTE — Transfer of Care (Signed)
Immediate Anesthesia Transfer of Care Note  Patient: Willie Blevins  Procedure(s) Performed: PARS PLANA VITRECTOMY WITH 25 GAUGE (Right Eye) PHOTOCOAGULATION WITH LASER (Right Eye) AIR/FLUID EXCHANGE (Right Eye)  Patient Location: PACU  Anesthesia Type:General  Level of Consciousness: awake, alert , oriented and patient cooperative  Airway & Oxygen Therapy: Patient Spontanous Breathing and Patient connected to nasal cannula oxygen  Post-op Assessment: Report given to RN and Post -op Vital signs reviewed and stable  Post vital signs: Reviewed and stable  Last Vitals:  Vitals Value Taken Time  BP 114/73 03/09/2018 12:44 PM  Temp    Pulse 87 03/09/2018 12:47 PM  Resp 13 03/09/2018 12:47 PM  SpO2 96 % 03/09/2018 12:47 PM  Vitals shown include unvalidated device data.  Last Pain:  Vitals:   03/09/18 0926  TempSrc: Oral  PainSc:       Patients Stated Pain Goal: 4 (03/09/18 0165)  Complications: No apparent anesthesia complications

## 2018-03-09 NOTE — Anesthesia Postprocedure Evaluation (Signed)
Anesthesia Post Note  Patient: Willie Blevins  Procedure(s) Performed: PARS PLANA VITRECTOMY WITH 25 GAUGE (Right Eye) PHOTOCOAGULATION WITH LASER (Right Eye) AIR/FLUID EXCHANGE (Right Eye)     Patient location during evaluation: PACU Anesthesia Type: General Level of consciousness: awake and alert and oriented Pain management: pain level controlled Vital Signs Assessment: post-procedure vital signs reviewed and stable Respiratory status: spontaneous breathing, nonlabored ventilation and respiratory function stable Cardiovascular status: blood pressure returned to baseline and stable Postop Assessment: no apparent nausea or vomiting Anesthetic complications: no    Last Vitals:  Vitals:   03/09/18 1345 03/09/18 1355  BP:  97/66  Pulse: 73 83  Resp: 13 14  Temp:  36.6 C  SpO2: 94% 94%    Last Pain:  Vitals:   03/09/18 1330  TempSrc:   PainSc: Asleep                 Willie Blevins A.

## 2018-03-09 NOTE — Anesthesia Procedure Notes (Signed)
Procedure Name: Intubation Date/Time: 03/09/2018 11:10 AM Performed by: Waynard Edwards, CRNA Pre-anesthesia Checklist: Patient identified, Emergency Drugs available, Suction available and Patient being monitored Patient Re-evaluated:Patient Re-evaluated prior to induction Oxygen Delivery Method: Circle system utilized Preoxygenation: Pre-oxygenation with 100% oxygen Induction Type: IV induction Ventilation: Mask ventilation without difficulty and Oral airway inserted - appropriate to patient size Laryngoscope Size: Hyacinth Meeker and 2 Grade View: Grade I Tube type: Oral Tube size: 7.5 mm Number of attempts: 1 Airway Equipment and Method: Stylet Placement Confirmation: ETT inserted through vocal cords under direct vision,  positive ETCO2 and breath sounds checked- equal and bilateral Secured at: 22 cm Tube secured with: Tape Dental Injury: Teeth and Oropharynx as per pre-operative assessment

## 2018-03-09 NOTE — Op Note (Signed)
Date of procedure: 1.23.2020  Surgeon: Bernarda Caffey, M.D., Ph.D  Assistant: Ernest Mallick, OA  Pre-operative Diagnosis: 1. Non-clearing vitreous hemorrhage, right eye  Post-operative diagnosis: 1. Non-clearing vitreous hemorrhage, right eye 2. Retinal breaks, right eye  Anesthesia: Local, MAC  Procedure: 1)   25 gauge pars plana vitrectomy w/ endolaser, Left Eye CPT 54650  Complications: none Estimated blood loss: minimal Specimens: none  Brief history: The patient has a history of decreased vision in the affected eye, and on examination, was noted to have vitreous hemorrhage affecting activities of daily living. The hemorrhage was monitored closely and failed to clear.The risks, benefits, and alternatives were explained to the patient, including pain, bleeding, infection, loss of vision, double vision, droopy eyelids, and need for more surgeries. Informed consent was obtained from the patient and placed in the chart.   Procedure: The patient was brought to the preoperative holding area where the correct eye was confirmed and marked. The patient was then brought to the operating room where general endotracheal anesthesia was induced without complication. A secondary time-out was performed to identify the correct patient, eye, procedure, and any allergies. The eye was prepped and draped in the usual sterile ophthalmic fashion followed by placement of a lid speculum.  A 25 gauge trocar was placed in the inferotemporal quadrant 4 mm posterior to the limbus in a beveled fashion. A 4 mm infusion cannula was placed through this trocar, and the infusion cannula was confirmed in the vitreous cavity with no incarceration of retina or choroid prior to turning it on. Two additional 25 gauge trocars were placed in the superonasal and superotemporal quadrants in a similar beveled fashion. The light pipe and cutter were introduced into the eye. Dense, old, white, vitreous hemorrhage  obscured the view of the posterior pole.  At this time, a standard three-port pars plana vitrectomy was performed using the light pipe, the cutter, and the BIOM viewing system. A thorough core and peripheral vitreous dissection was performed. A posterior vitreous detachment was confirmed using kenalog. Peripheral vitrectomy was completed with care as to not disturb the crystalline lens.  On peripheral inspection, 2 small retinal tears were noted at 12 and 3 just posterior to ora. The remainder of the retina was attached without any other breaks or defects. Next, endolaser was used to place 360 prophylactic retinopexy peripherally. There were no other peripheral breaks. A 50% fluid-air exchange was then performed.  The trocars were then removed and sutured with 7-0 vicryl in an interrupted fashion. Subconjunctival injections of antibiotic and Kenalog were administered. The lid speculum and drapes were removed. Drops of an antibiotic and steroid were given. The eye was patched and shielded. The patient tolerated the procedure well without any intraoperative or immediate postoperative complications. The patient was taken to the recovery room in good condition. The patient was instructed to follow-up with Dr. Coralyn Pear in clinic on the following morning.

## 2018-03-09 NOTE — Anesthesia Preprocedure Evaluation (Addendum)
Anesthesia Evaluation  Patient identified by MRN, date of birth, ID band Patient awake    Reviewed: Allergy & Precautions, NPO status , Patient's Chart, lab work & pertinent test results  History of Anesthesia Complications (+) Emergence Delirium and history of anesthetic complications  Airway Mallampati: III  TM Distance: >3 FB Neck ROM: Full  Mouth opening: Limited Mouth Opening  Dental no notable dental hx. (+) Teeth Intact   Pulmonary pneumonia, COPD,  COPD inhaler, former smoker,  Bullous emphysema S/P tracheostomy   Pulmonary exam normal breath sounds clear to auscultation       Cardiovascular negative cardio ROS Normal cardiovascular exam Rhythm:Regular Rate:Normal     Neuro/Psych PSYCHIATRIC DISORDERS Anxiety PTSD Neuromuscular disease    GI/Hepatic GERD  Medicated and Controlled,  Endo/Other  negative endocrine ROS  Renal/GU negative Renal ROS  negative genitourinary   Musculoskeletal Multiple Fx's- mandible, fibula, femur   Abdominal   Peds  Hematology  (+) anemia ,   Anesthesia Other Findings   Reproductive/Obstetrics                            Anesthesia Physical Anesthesia Plan  ASA: III  Anesthesia Plan: General   Post-op Pain Management:    Induction: Intravenous  PONV Risk Score and Plan: 3 and Ondansetron, Dexamethasone, Treatment may vary due to age or medical condition and Midazolam  Airway Management Planned: Oral ETT  Additional Equipment:   Intra-op Plan:   Post-operative Plan: Extubation in OR  Informed Consent: I have reviewed the patients History and Physical, chart, labs and discussed the procedure including the risks, benefits and alternatives for the proposed anesthesia with the patient or authorized representative who has indicated his/her understanding and acceptance.     Dental advisory given  Plan Discussed with: CRNA and  Surgeon  Anesthesia Plan Comments:         Anesthesia Quick Evaluation

## 2018-03-09 NOTE — Progress Notes (Signed)
[facility] - Clinic Note  03/10/2018     CHIEF COMPLAINT Patient presents for Post-op Follow-up   HISTORY OF PRESENT ILLNESS: Willie Blevins [name] is a 43 y.o. male who presents to the clinic today for:   HPI    Post-op Follow-up    In right eye.  Discomfort includes pain, itching and foreign body sensation.  Negative for tearing, discharge, floaters and none.  Vision is blurred at distance and is blurred at near.  I, the attending physician,  performed the HPI with the patient and updated documentation appropriately.          Comments    Patient states significant pain/discomfort OD yesterday after surgery. Was given pain medicine at hospital and took tylenol at home. Pain better today. OD feels scratchy.        Last edited by Rennis Chris, MD on 03/10/2018 10:11 AM. (History)    pt states he is in some pain today, he states he can see a line in his vision  Referring physician: Caroline More, MD [id number] [facility] [address] [place], [place] [address]  HISTORICAL INFORMATION:   Selected notes from the MEDICAL RECORD NUMBER Referred by Dr. Karleen Hampshire LEE:  Ocular Hx- PMH-    CURRENT MEDICATIONS: Current Outpatient Medications (Ophthalmic Drugs)  Medication Sig  . artificial tears (LACRILUBE) OINT ophthalmic ointment Place into the right eye at bedtime and may repeat dose one time if needed.  Marland Kitchen Propylene Glycol (SYSTANE BALANCE) 0.6 % SOLN Place 1 drop into the right eye as needed (dry eye).  . bacitracin-polymyxin b (POLYSPORIN) ophthalmic ointment Place into the right eye 4 (four) times daily as needed. Place a 1/2 inch ribbon of ointment into the lower eyelid.   No current facility-administered medications for this visit.  (Ophthalmic Drugs)   Current Outpatient Medications (Other)  Medication Sig  . acetaminophen (TYLENOL) 500 MG tablet Take 1-2 tablets (500-1,000 mg total) by mouth every 8 (eight) hours. (Patient taking  differently: Take 500-1,000 mg by mouth every 8 (eight) hours as needed for mild pain. )  . ascorbic acid (VITAMIN C) 1000 MG tablet Take 1 tablet (1,000 mg total) by mouth daily.  . benzocaine (ORAJEL) 10 % mucosal gel Use as directed in the mouth or throat 4 (four) times daily as needed for mouth pain (put a bedside).  . magnesium oxide (MAG-OX) 400 MG tablet Take 400 mg by mouth every evening.  . Melatonin 5 MG TABS Take 5 mg by mouth at bedtime.  . naproxen sodium (ALEVE) 220 MG tablet Take 440 mg by mouth 2 (two) times daily.  . pregabalin (LYRICA) 100 MG capsule Take 100 mg by mouth 3 (three) times daily.  . vitamin A 98119 UNIT capsule Take 10,000 Units by mouth daily.  . Vitamin D-Vitamin K (VITAMIN K2-VITAMIN D3 PO) Take 1 tablet by mouth daily.  . Zinc 30 MG CAPS Take 1 capsule by mouth daily.   No current facility-administered medications for this visit.  (Other)      REVIEW OF SYSTEMS: ROS    Positive for: Eyes   Negative for: Constitutional, Gastrointestinal, Neurological, Skin, Genitourinary, Musculoskeletal, HENT, Endocrine, Cardiovascular, Respiratory, Psychiatric, Allergic/Imm, Heme/Lymph   Last edited by Annalee Genta D on 03/10/2018  9:09 AM. (History)       ALLERGIES Allergies  Allergen Reactions  . Penicillins Hives and Other (See Comments)    PATIENT HAS HAD A PCN REACTION WITH IMMEDIATE RASH, FACIAL/TONGUE/THROAT SWELLING, SOB,  OR LIGHTHEADEDNESS WITH HYPOTENSION:  #  #  YES  #  #  Has patient had a PCN reaction causing severe rash involving mucus membranes or skin necrosis: No Has patient had a PCN reaction that required hospitalization: No Has patient had a PCN reaction occurring within the last 10 years: No If all of the above answers are "NO", then may proceed with Cephalosporin use.   . Adhesive [Tape] Rash    PAST MEDICAL HISTORY Past Medical History:  Diagnosis Date  . Anxiety    since accident  . Bullous emphysema (HCC)   . Complication of  anesthesia    woke up "swinging"  . COPD (chronic obstructive pulmonary disease) (HCC)   . GERD (gastroesophageal reflux disease)   . History of kidney stones   . Open right fibular fracture 12/02/2017  . Pneumonia   . Strabismus    with lazy eye (left)  . Traumatic iritis   . Vitreous hemorrhage, right (HCC)    non- clearing   Past Surgical History:  Procedure Laterality Date  . AIR/FLUID EXCHANGE Right 03/09/2018   Procedure: AIR/FLUID EXCHANGE;  Surgeon: Rennis Chris, MD;  Location: [facility] OR;  Service: Ophthalmology;  Laterality: Right;  . APPLICATION OF WOUND VAC Right 11/29/2017   Procedure: APPLICATION OF WOUND VAC;  Surgeon: Myrene Galas, MD;  Location: [facility] OR;  Service: Orthopedics;  Laterality: Right;  . FACIAL LACERATION REPAIR Left 11/29/2017   Procedure: ear LACERATION REPAIR;  Surgeon: Violeta Gelinas, MD;  Location: [facility] OR;  Service: General;  Laterality: Left;  . I&D EXTREMITY Right 11/29/2017   Procedure: IRRIGATION AND DEBRIDEMENT EXTREMITY;  Surgeon: Myrene Galas, MD;  Location: [facility] OR;  Service: Orthopedics;  Laterality: Right;  . I&D EXTREMITY Right 12/01/2017   Procedure: IRRIGATION AND DEBRIDEMENT RIGHT LOWER EXTREMITY AND VAC CHANGE;  Surgeon: Myrene Galas, MD;  Location: [facility] OR;  Service: Orthopedics;  Laterality: Right;  . INTRAMEDULLARY (IM) NAIL INTERTROCHANTERIC Left 11/29/2017   Procedure: INTRAMEDULLARY (IM) NAIL INTERTROCHANTRIC;  Surgeon: Myrene Galas, MD;  Location: [facility] OR;  Service: Orthopedics;  Laterality: Left;  Marland Kitchen MANDIBULAR HARDWARE REMOVAL N/A 01/06/2018   Procedure: MANDIBULAR HARDWARE REMOVAL;  Surgeon: Suzanna Obey, MD;  [facility];  Service: ENT;  Laterality: N/A;  . ORIF MANDIBULAR FRACTURE N/A 12/01/2017   Procedure: OPEN REDUCTION INTERNAL FIXATION (ORIF) MANDIBULAR FRACTURE;  Surgeon: Suzanna Obey, MD;  Location: [facility] OR;  Service: ENT;  Laterality: N/A;  . PARS PLANA VITRECTOMY Right 03/09/2018   Procedure: PARS PLANA VITRECTOMY WITH 25  GAUGE;  Surgeon: Rennis Chris, MD;  Location: [facility] OR;  Service: Ophthalmology;  Laterality: Right;  . PHOTOCOAGULATION WITH LASER Right 03/09/2018   Procedure: PHOTOCOAGULATION WITH LASER;  Surgeon: Rennis Chris, MD;  Location: [facility] OR;  Service: Ophthalmology;  Laterality: Right;  . TRACHEOSTOMY TUBE PLACEMENT N/A 12/01/2017   Procedure: TRACHEOSTOMY;  Surgeon: Suzanna Obey, MD;  Location: [facility] OR;  Service: ENT;  Laterality: N/A;  . TYMPANOMASTOIDECTOMY Right 12/05/2017   Procedure: right canal up tympanomastoid, right mastoidectomy, exploration right facial nerve, Type 1 tympanoplasty, CSF leak repair.;  Surgeon: Ermalinda Barrios, MD;  Location: [facility] OR;  Service: ENT;  Laterality: Right;    FAMILY HISTORY Family History  Problem Relation Age of Onset  . Diabetes Mother   . Bell's palsy Mother   . Diabetes Maternal Uncle   . Diabetes Paternal Uncle     SOCIAL HISTORY Social History   Tobacco Use  . Smoking status: Former Smoker    Last attempt to  quit: 12/21/2016    Years since quitting: 1.2  . Smokeless tobacco: Never Used  Substance Use Topics  . Alcohol use: Yes    Comment: rare  . Drug use: Never         OPHTHALMIC EXAM:  Base Eye Exam    Visual Acuity (Snellen - Linear)      Right Left   Dist [place] 20/800 20/30   Dist ph cc NI NI       Tonometry (Tonopen, 9:16 AM)      Right Left   Pressure 06        Pupils      Dark Light Shape React APD   Right 8 8 Round Minimal None   Left 3 2 Round Slow None       Visual Fields (Counting fingers)      Left Right    Full Full  Can count fingers superior quadrant OD but hand motion inferior quadrants.        Extraocular Movement      Right Left    Full, Ortho Full, Ortho       Neuro/Psych    Oriented x3:  Yes   Mood/Affect:  Normal       Dilation    Right eye:  1.0% Mydriacyl, 2.5% Phenylephrine @ 9:16 AM        Slit Lamp and Fundus Exam    Slit Lamp Exam      Right Left   Lids/Lashes Ptosis; lagophthalmos; no  blink Telangiectasia, Meibomian gland dysfunction, Ptosis   Conjunctiva/Sclera inferior Chemosis, mild sub conj heme, sutures intact White and quiet   Cornea 3-4+ Punctate epithelial erosions, irregular epithelium, 2+ Descemet's folds paracentral scar   Anterior Chamber Deep and quiet Deep and quiet   Iris Round and dilated Round and dilated   Lens 2+ Nuclear sclerosis, 1+ Cortical cataract 2+ Nuclear sclerosis, 1+ Cortical cataract   Vitreous post vitrectomy; 35-40% air bubble clear       Fundus Exam      Right Left   Disc sharp Pink and Sharp   C/D Ratio  0.3   Macula Good foveal reflex Flat, Good foveal reflex, No heme or edema   Vessels Normal Normal   Periphery attached; Good peripheral laser changes Attached             IMAGING AND PROCEDURES  Imaging and Procedures for @TODAY @           ASSESSMENT/PLAN:    ICD-10-CM   1. Vitreous hemorrhage of right eye (HCC) H43.11   2. Trauma T14.90XA   3. Retinal edema H35.81   4. 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 Vitreous Hemorrhage OD - likely sustained during traumatic tree injury on 10.15.19 - at presentation: VH white and chronic looking and quite dense, diffuse - now POD1 s/p PPV w/ laser, partial FAX OD, 1.23.2020             - doing well this morning -- VH and vitreous cleared  - ~35-40% air bubble in place             - retina attached and in good position -- good peripheral laser around breaks             - IOP low             -  start  PF 4x/day OD                         zymaxid QID OD                         Atropine BID OD   PSO ung QID OD             - cont face down positioning x3 days; avoid laying flat on back             - eye shield when sleeping             - post op drop and positioning instructions reviewed              - tylenol/ibuprofen for pain  - f/u 1 week  - Fax notes to case manager, Ileana RoupAmy Pearson [phone]  3. No retinal edema on exam or OCT  4-6. 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 History of amblyopia OS - pt reports history of patching as child - states baseline vision was ~20/40 OS, but thinks VA has improved since injury to OD - monitor   Ophthalmic Meds Ordered this visit:  Meds ordered this encounter  Medications  . bacitracin-polymyxin b (POLYSPORIN) ophthalmic ointment    Sig: Place into the right eye 4 (four) times daily as needed. Place a 1/2 inch ribbon of ointment into the lower eyelid.    Dispense:  3.5 g    Refill:  3       Return in about 1 week (around 03/17/2018) for f/u VH OD, DFE, OCT.  There are no Patient Instructions on file for this visit.   Explained the diagnoses, plan, and follow up with the patient and they expressed understanding.  Patient expressed understanding of the importance of proper follow up care.   This document serves as a record of services personally performed by Karie ChimeraBrian G. Janelly Switalski, MD, PhD. It was created on their behalf by Laurian BrimAmanda Brown, OA, an ophthalmic assistant. The creation of this record is the provider's dictation and/or activities during the visit.    Electronically signed by: Laurian BrimAmanda Brown, OA  01.23.2020 12:28 PM    Karie ChimeraBrian G. Brittanie Dosanjh, M.D., Ph.D. Diseases & Surgery of the Retina and Vitreous [facility]  I have reviewed the above documentation for accuracy and completeness, and I agree with the above.  Karie ChimeraBrian G. Blanca Thornton, M.D., Ph.D. 03/13/18 12:33 PM    Abbreviations: M myopia (nearsighted); A astigmatism; H hyperopia (farsighted); P presbyopia; Mrx spectacle prescription;  CTL contact lenses; OD right eye; OS left eye; OU both eyes  XT exotropia; ET esotropia; PEK punctate epithelial keratitis; PEE punctate epithelial erosions; DES dry eye syndrome; MGD meibomian gland dysfunction; ATs artificial tears; PFAT's preservative free artificial tears; NSC nuclear sclerotic cataract; PSC posterior subcapsular cataract; ERM epi-retinal membrane; PVD posterior vitreous detachment; RD retinal detachment; DM diabetes mellitus; DR diabetic retinopathy; NPDR non-proliferative diabetic retinopathy; PDR proliferative diabetic retinopathy; CSME clinically significant macular edema; DME diabetic macular edema; dbh dot blot hemorrhages;  CWS cotton wool spot; POAG primary open angle glaucoma; C/D cup-to-disc ratio; HVF humphrey visual field; GVF goldmann visual field; OCT optical coherence tomography; IOP intraocular pressure; BRVO Branch retinal vein occlusion; CRVO central retinal vein occlusion; CRAO central retinal artery occlusion; BRAO branch retinal artery occlusion; RT retinal tear; SB scleral buckle; PPV pars plana vitrectomy; VH Vitreous hemorrhage; PRP panretinal laser photocoagulation; IVK intravitreal kenalog; VMT vitreomacular traction; MH Macular hole;  NVD neovascularization of the disc; NVE neovascularization elsewhere; AREDS age related eye disease study; ARMD age related macular degeneration; POAG primary open angle glaucoma; EBMD epithelial/anterior basement membrane dystrophy; ACIOL anterior chamber intraocular lens; IOL intraocular lens; PCIOL posterior chamber intraocular lens; Phaco/IOL phacoemulsification with intraocular lens placement; Kerkhoven photorefractive keratectomy; LASIK laser assisted in situ keratomileusis; HTN hypertension; DM diabetes mellitus; COPD chronic obstructive pulmonary disease

## 2018-03-09 NOTE — Brief Op Note (Signed)
03/09/2018  1:01 PM  PATIENT:  Willie Blevins  43 y.o. male  PRE-OPERATIVE DIAGNOSIS:  vitreous hemorrhage right eye  POST-OPERATIVE DIAGNOSIS:  Vitreous Hemorrhage Right Eye  PROCEDURE:  Procedure(s): PARS PLANA VITRECTOMY WITH 25 GAUGE (Right) PHOTOCOAGULATION WITH LASER (Right) AIR/FLUID EXCHANGE (Right)  SURGEON:  Surgeon(s) and Role:    Rennis Chris, MD - Primary  ASSISTANTS: Laurian Brim, OA   ANESTHESIA:   general  EBL:  2 mL   BLOOD ADMINISTERED:none  DRAINS: none   LOCAL MEDICATIONS USED:  NONE  SPECIMEN:  No Specimen  DISPOSITION OF SPECIMEN:  N/A  COUNTS:  YES  TOURNIQUET:  * No tourniquets in log *  DICTATION: .Note written in EPIC  PLAN OF CARE: Discharge to home after PACU  PATIENT DISPOSITION:  PACU - hemodynamically stable.   Delay start of Pharmacological VTE agent (>24hrs) due to surgical blood loss or risk of bleeding: not applicable

## 2018-03-10 ENCOUNTER — Ambulatory Visit (INDEPENDENT_AMBULATORY_CARE_PROVIDER_SITE_OTHER): Payer: Worker's Compensation | Admitting: Ophthalmology

## 2018-03-10 ENCOUNTER — Encounter (HOSPITAL_COMMUNITY): Payer: Self-pay | Admitting: Ophthalmology

## 2018-03-10 DIAGNOSIS — H16211 Exposure keratoconjunctivitis, right eye: Secondary | ICD-10-CM

## 2018-03-10 DIAGNOSIS — S0451XS Injury of facial nerve, right side, sequela: Secondary | ICD-10-CM

## 2018-03-10 DIAGNOSIS — H3581 Retinal edema: Secondary | ICD-10-CM

## 2018-03-10 DIAGNOSIS — H53002 Unspecified amblyopia, left eye: Secondary | ICD-10-CM

## 2018-03-10 DIAGNOSIS — H4311 Vitreous hemorrhage, right eye: Secondary | ICD-10-CM

## 2018-03-10 DIAGNOSIS — H0223A Paralytic lagophthalmos right eye, upper and lower eyelids: Secondary | ICD-10-CM

## 2018-03-10 DIAGNOSIS — T1490XA Injury, unspecified, initial encounter: Secondary | ICD-10-CM

## 2018-03-10 MED ORDER — BACITRACIN-POLYMYXIN B 500-10000 UNIT/GM OP OINT: TOPICAL_OINTMENT | Freq: Four times a day (QID) | OPHTHALMIC | 3 refills | Status: DC | PRN

## 2018-03-13 ENCOUNTER — Encounter (INDEPENDENT_AMBULATORY_CARE_PROVIDER_SITE_OTHER): Payer: Self-pay | Admitting: Ophthalmology

## 2018-03-15 ENCOUNTER — Other Ambulatory Visit (INDEPENDENT_AMBULATORY_CARE_PROVIDER_SITE_OTHER): Payer: Self-pay | Admitting: Ophthalmology

## 2018-03-15 MED ORDER — BACITRACIN-POLYMYXIN B 500-10000 UNIT/GM OP OINT: TOPICAL_OINTMENT | Freq: Every day | OPHTHALMIC | 11 refills | Status: AC

## 2018-03-15 NOTE — Progress Notes (Signed)
[facility] - Clinic Note  03/17/2018     CHIEF COMPLAINT Patient presents for Post-op Follow-up   HISTORY OF PRESENT ILLNESS: Willie Blevins is a [age] y.o. male who presents to the clinic today for:   HPI    Post-op Follow-up    In right eye.  Discomfort includes itching.  Vision is stable.  I, the attending physician,  performed the HPI with the patient and updated documentation appropriately.          Comments    43 y/o male pt here for 1 wk po s/p PPV 1.23.20.  [place] in [place].  Denies pain, flashes, floaters, but c/o feeling like he needs to rub OD a lot, although he has refrained from doing so.  Pred 6x daily OD [place] QID OD Atropine BID OD PSO ung QID OD       Last edited by Willie Blevins, Willie Song, MD on 03/17/2018  4:00 PM. (History)    pt states his vision is still very cloudy, he states he's had a lot of itching, and he has been keeping it taped closed  Referring physician: Caroline Blevins, Willie D, MD [address] ZO#[id number] [facility] [address], [place] [id number]  HISTORICAL INFORMATION:   Selected notes from the MEDICAL RECORD NUMBER Referred by Dr. Karleen Blevins Blevins:  Ocular Hx- PMH-    CURRENT MEDICATIONS: Current Outpatient Medications (Ophthalmic Drugs)  Medication Sig  . artificial tears (LACRILUBE) OINT ophthalmic ointment Place into the right eye at bedtime and may repeat dose one time if needed.  . bacitracin-polymyxin b (POLYSPORIN) ophthalmic ointment Place into the right eye 6 (six) times daily. Place a 1/2 inch ribbon of ointment into the lower eyelid.  Marland Kitchen. moxifloxacin (VIGAMOX) 0.5 % ophthalmic solution Place 1 drop into the right eye every hour while awake.  . prednisoLONE acetate (PRED FORTE) 1 % ophthalmic suspension Place 1 drop into the right eye 4 (four) times daily.  Marland Kitchen. Propylene Glycol (SYSTANE BALANCE) 0.6 % SOLN Place 1 drop into the right eye as needed (dry eye).   No current facility-administered medications for  this visit.  (Ophthalmic Drugs)   Current Outpatient Medications (Other)  Medication Sig  . acetaminophen (TYLENOL) 500 MG tablet Take 1-2 tablets (500-1,000 mg total) by mouth every 8 (eight) hours. (Patient taking differently: Take 500-1,000 mg by mouth every 8 (eight) hours as needed for mild pain. )  . ascorbic acid (VITAMIN C) 1000 MG tablet Take 1 tablet (1,000 mg total) by mouth daily.  . benzocaine (ORAJEL) 10 % mucosal gel Use as directed in the mouth or throat 4 (four) times daily as needed for mouth pain (put a bedside).  . magnesium oxide (MAG-OX) 400 MG tablet Take 400 mg by mouth every evening.  . Melatonin 5 MG TABS Take 5 mg by mouth at bedtime.  . naproxen sodium (ALEVE) 220 MG tablet Take 440 mg by mouth 2 (two) times daily.  . pregabalin (LYRICA) 100 MG capsule Take 100 mg by mouth 3 (three) times daily.  . vitamin A [id number] UNIT capsule Take 10,000 Units by mouth daily.  . Vitamin Blevins-Vitamin K (VITAMIN K2-VITAMIN D3 PO) Take 1 tablet by mouth daily.  . Zinc 30 MG CAPS Take 1 capsule by mouth daily.   No current facility-administered medications for this visit.  (Other)      REVIEW OF SYSTEMS: ROS    Positive for: Musculoskeletal, Eyes   Negative for: Constitutional, Gastrointestinal, Neurological, Skin, Genitourinary,  HENT, Endocrine, Cardiovascular, Respiratory, Psychiatric, Allergic/Imm, Heme/Lymph   Last edited by Willie Blevins, COA on 03/17/2018  2:49 PM. (History)       ALLERGIES Allergies  Allergen Reactions  . Penicillins Hives and Other (See Comments)    PATIENT HAS HAD A PCN REACTION WITH IMMEDIATE RASH, FACIAL/TONGUE/THROAT SWELLING, SOB, OR LIGHTHEADEDNESS WITH HYPOTENSION:  #  #  YES  #  #  Has patient had a PCN reaction causing severe rash involving mucus membranes or skin necrosis: No Has patient had a PCN reaction that required hospitalization: No Has patient had a PCN reaction occurring within the last 10 years: No If all of the above answers  are "NO", then may proceed with Cephalosporin use.   . Adhesive [Tape] Rash    PAST MEDICAL HISTORY Past Medical History:  Diagnosis Date  . Anxiety    since accident  . Bullous emphysema (HCC)   . Complication of anesthesia    woke up "swinging"  . COPD (chronic obstructive pulmonary disease) (HCC)   . GERD (gastroesophageal reflux disease)   . History of kidney stones   . Open right fibular fracture 12/02/2017  . Pneumonia   . Strabismus    with lazy eye (left)  . Traumatic iritis   . Vitreous hemorrhage, right (HCC)    non- clearing   Past Surgical History:  Procedure Laterality Date  . AIR/FLUID EXCHANGE Right 03/09/2018   Procedure: AIR/FLUID EXCHANGE;  Surgeon: Willie Chris, MD;  Location: [facility] OR;  Service: Ophthalmology;  Laterality: Right;  . APPLICATION OF WOUND VAC Right 11/29/2017   Procedure: APPLICATION OF WOUND VAC;  Surgeon: Myrene Galas, MD;  Location: [facility] OR;  Service: Orthopedics;  Laterality: Right;  . FACIAL LACERATION REPAIR Left 11/29/2017   Procedure: ear LACERATION REPAIR;  Surgeon: Violeta Gelinas, MD;  Location: [facility] OR;  Service: General;  Laterality: Left;  . I&Blevins EXTREMITY Right 11/29/2017   Procedure: IRRIGATION AND DEBRIDEMENT EXTREMITY;  Surgeon: Myrene Galas, MD;  Location: [facility] OR;  Service: Orthopedics;  Laterality: Right;  . I&Blevins EXTREMITY Right 12/01/2017   Procedure: IRRIGATION AND DEBRIDEMENT RIGHT LOWER EXTREMITY AND VAC CHANGE;  Surgeon: Myrene Galas, MD;  Location: [facility] OR;  Service: Orthopedics;  Laterality: Right;  . INTRAMEDULLARY (IM) NAIL INTERTROCHANTERIC Left 11/29/2017   Procedure: INTRAMEDULLARY (IM) NAIL INTERTROCHANTRIC;  Surgeon: Myrene Galas, MD;  Location: [facility] OR;  Service: Orthopedics;  Laterality: Left;  Marland Kitchen MANDIBULAR HARDWARE REMOVAL N/A 01/06/2018   Procedure: MANDIBULAR HARDWARE REMOVAL;  Surgeon: Suzanna Obey, MD;  Location: [facility] OR;  Service: ENT;  Laterality: N/A;  . ORIF MANDIBULAR FRACTURE N/A 12/01/2017   Procedure:  OPEN REDUCTION INTERNAL FIXATION (ORIF) MANDIBULAR FRACTURE;  Surgeon: Suzanna Obey, MD;  Location: [facility] OR;  Service: ENT;  Laterality: N/A;  . PARS PLANA VITRECTOMY Right 03/09/2018   Procedure: PARS PLANA VITRECTOMY WITH 25 GAUGE;  Surgeon: Willie Chris, MD;  Location: [facility] OR;  Service: Ophthalmology;  Laterality: Right;  . PHOTOCOAGULATION WITH LASER Right 03/09/2018   Procedure: PHOTOCOAGULATION WITH LASER;  Surgeon: Willie Chris, MD;  Location: [facility] OR;  Service: Ophthalmology;  Laterality: Right;  . TRACHEOSTOMY TUBE PLACEMENT N/A 12/01/2017   Procedure: TRACHEOSTOMY;  Surgeon: Suzanna Obey, MD;  Location: [facility] OR;  Service: ENT;  Laterality: N/A;  . TYMPANOMASTOIDECTOMY Right 12/05/2017   Procedure: right canal up tympanomastoid, right mastoidectomy, exploration right facial nerve, Type 1 tympanoplasty, CSF leak repair.;  Surgeon: Ermalinda Barrios, MD;  Location: [facility] OR;  Service: ENT;  Laterality: Right;  FAMILY HISTORY Family History  Problem Relation Age of Onset  . Diabetes Mother   . Bell's palsy Mother   . Diabetes Maternal Uncle   . Diabetes Paternal Uncle     SOCIAL HISTORY Social History   Tobacco Use  . Smoking status: Former Smoker    Last attempt to quit: 12/21/2016    Years since quitting: 1.2  . Smokeless tobacco: Never Used  Substance Use Topics  . Alcohol use: Yes    Comment: rare  . Drug use: Never         OPHTHALMIC EXAM:  Base Eye Exam    Visual Acuity (Snellen - Linear)      Right Left   Dist [place] CF 20/30   Dist ph [place] NI NI       Tonometry (Tonopen, 2:52 PM)      Right Left   Pressure 7 9       Pupils      Dark Light Shape React APD   Right 5 5 Round No None   Left 3 2 Round Slow None  Pharm dil OD       Visual Fields (Counting fingers)      Left Right    Full Full       Extraocular Movement      Right Left    Full, Ortho Full, Ortho       Neuro/Psych    Oriented x3:  Yes   Mood/Affect:  Normal       Dilation    Right eye:  1.0%  Mydriacyl, 2.5% Phenylephrine @ 2:52 PM        Slit Lamp and Fundus Exam    Slit Lamp Exam      Right Left   Lids/Lashes Ptosis; lagophthalmos; no blink Telangiectasia, Meibomian gland dysfunction, Ptosis   Conjunctiva/Sclera 1+injection, sutures intact White and quiet   Cornea 6.25x6.00 mm central/nasal epi defect with elevated/white rim; 3-4+ Punctate epithelial erosions, irregular epithelium, 2-3+ Descemet's folds paracentral scar   Anterior Chamber mild fibrin reaction over the pupil Deep and quiet   Iris Round and dilated Round and dilated   Lens 2+ Nuclear sclerosis, 1+ Cortical cataract 2+ Nuclear sclerosis, 1+ Cortical cataract   Vitreous post vitrectomy; 35-40% air bubble clear       Fundus Exam      Right Left   Disc hazy view, perfused Pink and Sharp   C/Blevins Ratio  0.3   Macula very hazy view, Flat Flat, Good foveal reflex, No heme or edema   Vessels Normal Normal   Periphery very hazy view, attached; Good peripheral laser changes Attached             IMAGING AND PROCEDURES  Imaging and Procedures for @TODAY @  OCT, Retina - OU - Both Eyes       Right Eye Quality was poor. Progression has no prior data.   Left Eye Quality was good. Central Foveal Thickness: 279. Progression has been stable. Findings include normal foveal contour, no SRF, no IRF.   Notes *Images captured and stored on drive  Diagnosis / Impression:  OD: no image obtained OS: NFP, No IRF/SRF   Clinical management:  See below  Abbreviations: NFP - Normal foveal profile. CME - cystoid macular edema. PED - pigment epithelial detachment. IRF - intraretinal fluid. SRF - subretinal fluid. EZ - ellipsoid zone. ERM - epiretinal membrane. ORA - outer retinal atrophy. ORT - outer retinal tubulation. SRHM - subretinal hyper-reflective material  ASSESSMENT/PLAN:    ICD-10-CM   1. Vitreous hemorrhage of right eye (HCC) H43.11   2. Trauma T14.90XA   3. Retinal edema H35.81  OCT, Retina - OU - Both Eyes  4. 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 Vitreous Hemorrhage OD - likely sustained during traumatic tree injury on 10.15.19 - at presentation: VH white and chronic looking and quite dense, diffuse - now POW1 s/p PPV w/ laser, partial FAX OD, 1.23.2020             - VH and vitreous cleared, but now appears to have sterile ulcer -- nasal/central  - ~35-40% air bubble in place             - retina attached and in good position -- good peripheral laser around breaks             - IOP 7             - start  Vigamox Q1H OD  - Cont  PF QID OD                         Atropine BID OD   PSO ung QID OD             - cont face down positioning 50% of time; avoid laying flat on back             - eye shield when sleeping             - post op drop and positioning instructions reviewed             - tylenol/ibuprofen for pain  - f/u Monday, February 3, 9AM  - Fax notes to case manager, Ileana Roup [phone]  3. No retinal edema on exam or OCT  4-6. 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 History of amblyopia OS - pt reports history of patching as child - states baseline vision was ~20/40 OS, but thinks VA has improved since injury to OD - monitor   Ophthalmic Meds Ordered this visit:  Meds ordered this encounter  Medications   . DISCONTD: moxifloxacin (VIGAMOX) 0.5 % ophthalmic solution    Sig: Place 1 drop into both eyes 3 (three) times daily for 7 days.    Dispense:  3 mL    Refill:  0  . moxifloxacin (VIGAMOX) 0.5 % ophthalmic solution    Sig: Place 1 drop into the right eye every hour while awake.    Dispense:  3 mL    Refill:  3  . prednisoLONE acetate (PRED FORTE) 1 % ophthalmic suspension    Sig: Place 1 drop into the right eye 4 (four) times daily.    Dispense:  10 mL    Refill:  0       Return in about 3 days (around  03/20/2018) for POV; surface check.  There are no Patient Instructions on file for this visit.   Explained the diagnoses, plan, and follow up with the patient and they expressed understanding.  Patient expressed understanding of the importance of proper follow up care.   This document serves as a record of services personally performed by Karie Chimera, MD, PhD. It was created on their behalf by Laurian Brim, OA, an ophthalmic assistant. The creation of this record is the provider's dictation and/or activities during the visit.    Electronically signed by: Laurian Brim, OA  01.29.2020 8:55 AM     Karie Chimera, M.Blevins., Ph.Blevins. Diseases & Surgery of the Retina and Vitreous [facility] [place]  I have reviewed the above documentation for accuracy and completeness, and I agree with the above. Karie Chimera, M.Blevins., Ph.Blevins. 03/20/18 8:58 AM   Abbreviations: M myopia (nearsighted); A astigmatism; H hyperopia (farsighted); P presbyopia; Mrx spectacle prescription;  CTL contact lenses; OD right eye; OS left eye; OU both eyes  XT exotropia; ET esotropia; PEK punctate epithelial keratitis; PEE punctate epithelial erosions; DES dry eye syndrome; MGD meibomian gland dysfunction; ATs artificial tears; PFAT's preservative free artificial tears; NSC nuclear sclerotic cataract; PSC posterior subcapsular cataract; ERM epi-retinal membrane; PVD posterior vitreous detachment; RD  retinal detachment; DM diabetes mellitus; DR diabetic retinopathy; NPDR non-proliferative diabetic retinopathy; PDR proliferative diabetic retinopathy; CSME clinically significant macular edema; DME diabetic macular edema; dbh dot blot hemorrhages; CWS cotton wool spot; POAG primary open angle glaucoma; C/Blevins cup-to-disc ratio; HVF humphrey visual field; GVF goldmann visual field; OCT optical coherence tomography; IOP intraocular pressure; BRVO Branch retinal vein occlusion; CRVO central retinal vein occlusion; CRAO central retinal artery occlusion; BRAO branch retinal artery occlusion; RT retinal tear; SB scleral buckle; PPV pars plana vitrectomy; VH Vitreous hemorrhage; PRP panretinal laser photocoagulation; IVK intravitreal kenalog; VMT vitreomacular traction; MH Macular hole;  NVD neovascularization of the disc; NVE neovascularization elsewhere; AREDS age related eye disease study; ARMD age related macular degeneration; POAG primary open angle glaucoma; EBMD epithelial/anterior basement membrane dystrophy; ACIOL anterior chamber intraocular lens; IOL intraocular lens; PCIOL posterior chamber intraocular lens; Phaco/IOL phacoemulsification with intraocular lens placement; PRK photorefractive keratectomy; LASIK laser assisted in situ keratomileusis; HTN hypertension; DM diabetes mellitus; COPD chronic obstructive pulmonary disease

## 2018-03-17 ENCOUNTER — Encounter (INDEPENDENT_AMBULATORY_CARE_PROVIDER_SITE_OTHER): Payer: Self-pay | Admitting: Ophthalmology

## 2018-03-17 ENCOUNTER — Ambulatory Visit (INDEPENDENT_AMBULATORY_CARE_PROVIDER_SITE_OTHER): Payer: Worker's Compensation | Admitting: Ophthalmology

## 2018-03-17 DIAGNOSIS — H3581 Retinal edema: Secondary | ICD-10-CM | POA: Diagnosis not present

## 2018-03-17 DIAGNOSIS — T1490XA Injury, unspecified, initial encounter: Secondary | ICD-10-CM

## 2018-03-17 DIAGNOSIS — H53002 Unspecified amblyopia, left eye: Secondary | ICD-10-CM

## 2018-03-17 DIAGNOSIS — H16211 Exposure keratoconjunctivitis, right eye: Secondary | ICD-10-CM

## 2018-03-17 DIAGNOSIS — S0451XS Injury of facial nerve, right side, sequela: Secondary | ICD-10-CM

## 2018-03-17 DIAGNOSIS — H4311 Vitreous hemorrhage, right eye: Secondary | ICD-10-CM

## 2018-03-17 DIAGNOSIS — H0223A Paralytic lagophthalmos right eye, upper and lower eyelids: Secondary | ICD-10-CM

## 2018-03-17 MED ORDER — MOXIFLOXACIN HCL 0.5 % OP SOLN: 1.0000 [drp] | Freq: Three times a day (TID) | OPHTHALMIC | 0 refills | Status: DC

## 2018-03-17 MED ORDER — PREDNISOLONE ACETATE 1 % OP SUSP: 1.0000 [drp] | Freq: Four times a day (QID) | OPHTHALMIC | 0 refills | Status: AC

## 2018-03-17 MED ORDER — MOXIFLOXACIN HCL 0.5 % OP SOLN: 1.0000 [drp] | OPHTHALMIC | 3 refills | Status: DC

## 2018-03-19 NOTE — Progress Notes (Deleted)
[facility] - Clinic Note  03/20/2018     CHIEF COMPLAINT Patient presents for No chief complaint on file.   HISTORY OF PRESENT ILLNESS: Willie Blevins is a 43 y.o. male who presents to the clinic today for:   pt states he is in some pain today, he states he can see a line in his vision  Referring physician: Caroline More, MD [id number] [facility] [address] [place], [place] [address]  HISTORICAL INFORMATION:   Selected notes from the MEDICAL RECORD NUMBER Referred by Dr. Karleen Hampshire LEE:  Ocular Hx- PMH-    CURRENT MEDICATIONS: Current Outpatient Medications (Ophthalmic Drugs)  Medication Sig  . artificial tears (LACRILUBE) OINT ophthalmic ointment Place into the right eye at bedtime and may repeat dose one time if needed.  . bacitracin-polymyxin b (POLYSPORIN) ophthalmic ointment Place into the right eye 6 (six) times daily. Place a 1/2 inch ribbon of ointment into the lower eyelid.  Marland Kitchen moxifloxacin (VIGAMOX) 0.5 % ophthalmic solution Place 1 drop into the right eye every hour while awake.  . prednisoLONE acetate (PRED FORTE) 1 % ophthalmic suspension Place 1 drop into the right eye 4 (four) times daily.  Marland Kitchen Propylene Glycol (SYSTANE BALANCE) 0.6 % SOLN Place 1 drop into the right eye as needed (dry eye).   No current facility-administered medications for this visit.  (Ophthalmic Drugs)   Current Outpatient Medications (Other)  Medication Sig  . acetaminophen (TYLENOL) 500 MG tablet Take 1-2 tablets (500-1,000 mg total) by mouth every 8 (eight) hours. (Patient taking differently: Take 500-1,000 mg by mouth every 8 (eight) hours as needed for mild pain. )  . ascorbic acid (VITAMIN C) 1000 MG tablet Take 1 tablet (1,000 mg total) by mouth daily.  . benzocaine (ORAJEL) 10 % mucosal gel Use as directed in the mouth or throat 4 (four) times daily as needed for mouth pain (put a bedside).  . magnesium oxide (MAG-OX) 400 MG tablet Take 400 mg  by mouth every evening.  . Melatonin 5 MG TABS Take 5 mg by mouth at bedtime.  . naproxen sodium (ALEVE) 220 MG tablet Take 440 mg by mouth 2 (two) times daily.  . pregabalin (LYRICA) 100 MG capsule Take 100 mg by mouth 3 (three) times daily.  . vitamin A 91478 UNIT capsule Take 10,000 Units by mouth daily.  . Vitamin D-Vitamin K (VITAMIN K2-VITAMIN D3 PO) Take 1 tablet by mouth daily.  . Zinc 30 MG CAPS Take 1 capsule by mouth daily.   No current facility-administered medications for this visit.  (Other)      REVIEW OF SYSTEMS:    ALLERGIES Allergies  Allergen Reactions  . Penicillins Hives and Other (See Comments)    PATIENT HAS HAD A PCN REACTION WITH IMMEDIATE RASH, FACIAL/TONGUE/THROAT SWELLING, SOB, OR LIGHTHEADEDNESS WITH HYPOTENSION:  #  #  YES  #  #  Has patient had a PCN reaction causing severe rash involving mucus membranes or skin necrosis: No Has patient had a PCN reaction that required hospitalization: No Has patient had a PCN reaction occurring within the last 10 years: No If all of the above answers are "NO", then may proceed with Cephalosporin use.   . Adhesive [Tape] Rash    PAST MEDICAL HISTORY Past Medical History:  Diagnosis Date  . Anxiety    since accident  . Bullous emphysema (HCC)   . Complication of anesthesia    woke up "swinging"  . COPD (chronic  obstructive pulmonary disease) (HCC)   . GERD (gastroesophageal reflux disease)   . History of kidney stones   . Open right fibular fracture 12/02/2017  . Pneumonia   . Strabismus    with lazy eye (left)  . Traumatic iritis   . Vitreous hemorrhage, right (HCC)    non- clearing   Past Surgical History:  Procedure Laterality Date  . AIR/FLUID EXCHANGE Right 03/09/2018   Procedure: AIR/FLUID EXCHANGE;  Surgeon: Rennis Chris, MD;  Location: [facility] OR;  Service: Ophthalmology;  Laterality: Right;  . APPLICATION OF WOUND VAC Right 11/29/2017   Procedure: APPLICATION OF WOUND VAC;  Surgeon: Myrene Galas, MD;  Location: [facility] OR;  Service: Orthopedics;  Laterality: Right;  . FACIAL LACERATION REPAIR Left 11/29/2017   Procedure: ear LACERATION REPAIR;  Surgeon: Violeta Gelinas, MD;  Location: [facility] OR;  Service: General;  Laterality: Left;  . I&D EXTREMITY Right 11/29/2017   Procedure: IRRIGATION AND DEBRIDEMENT EXTREMITY;  Surgeon: Myrene Galas, MD;  Location: [facility] OR;  Service: Orthopedics;  Laterality: Right;  . I&D EXTREMITY Right 12/01/2017   Procedure: IRRIGATION AND DEBRIDEMENT RIGHT LOWER EXTREMITY AND VAC CHANGE;  Surgeon: Myrene Galas, MD;  Location: [facility] OR;  Service: Orthopedics;  Laterality: Right;  . INTRAMEDULLARY (IM) NAIL INTERTROCHANTERIC Left 11/29/2017   Procedure: INTRAMEDULLARY (IM) NAIL INTERTROCHANTRIC;  Surgeon: Myrene Galas, MD;  Location: [facility] OR;  Service: Orthopedics;  Laterality: Left;  Marland Kitchen MANDIBULAR HARDWARE REMOVAL N/A 01/06/2018   Procedure: MANDIBULAR HARDWARE REMOVAL;  Surgeon: Suzanna Obey, MD;  Location: [facility] OR;  Service: ENT;  Laterality: N/A;  . ORIF MANDIBULAR FRACTURE N/A 12/01/2017   Procedure: OPEN REDUCTION INTERNAL FIXATION (ORIF) MANDIBULAR FRACTURE;  Surgeon: Suzanna Obey, MD;  Location: [facility] OR;  Service: ENT;  Laterality: N/A;  . PARS PLANA VITRECTOMY Right 03/09/2018   Procedure: PARS PLANA VITRECTOMY WITH 25 GAUGE;  Surgeon: Rennis Chris, MD;  Location: [facility] OR;  Service: Ophthalmology;  Laterality: Right;  . PHOTOCOAGULATION WITH LASER Right 03/09/2018   Procedure: PHOTOCOAGULATION WITH LASER;  Surgeon: Rennis Chris, MD;  Location: [facility] OR;  Service: Ophthalmology;  Laterality: Right;  . TRACHEOSTOMY TUBE PLACEMENT N/A 12/01/2017   Procedure: TRACHEOSTOMY;  Surgeon: Suzanna Obey, MD;  Location: [facility] OR;  Service: ENT;  Laterality: N/A;  . TYMPANOMASTOIDECTOMY Right 12/05/2017   Procedure: right canal up tympanomastoid, right mastoidectomy, exploration right facial nerve, Type 1 tympanoplasty, CSF leak repair.;  Surgeon: Ermalinda Barrios, MD;  Location: [facility] OR;   Service: ENT;  Laterality: Right;    FAMILY HISTORY Family History  Problem Relation Age of Onset  . Diabetes Mother   . Bell's palsy Mother   . Diabetes Maternal Uncle   . Diabetes Paternal Uncle     SOCIAL HISTORY Social History   Tobacco Use  . Smoking status: Former Smoker    Last attempt to quit: 12/21/2016    Years since quitting: 1.2  . Smokeless tobacco: Never Used  Substance Use Topics  . Alcohol use: Yes    Comment: rare  . Drug use: Never         OPHTHALMIC EXAM:   Not recorded      IMAGING AND PROCEDURES  Imaging and Procedures for @TODAY @           ASSESSMENT/PLAN:    ICD-10-CM   1. Vitreous hemorrhage of right eye (HCC) H43.11   2. Trauma T14.90XA   3. Retinal edema H35.81 OCT, Retina - OU - Both Eyes  4. 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 Vitreous Hemorrhage OD - likely sustained during traumatic tree injury on 10.15.19 - at presentation: VH white and chronic looking and quite dense, diffuse - now POD1 s/p PPV w/ laser, partial FAX OD, 1.23.2020             - doing well this morning -- VH and vitreous cleared  - ~35-40% air bubble in place             - retina attached and in good position -- good peripheral laser around breaks             - IOP low             - start  PF 4x/day OD                         zymaxid QID OD                         Atropine BID OD   PSO ung QID OD             - cont face down positioning x3 days; avoid laying flat on back             - eye shield when sleeping             - post op drop and positioning instructions reviewed             - tylenol/ibuprofen for pain  - f/u 1 week  - Fax notes to case manager, Ileana RoupAmy Pearson [phone]  3. No retinal edema on exam or OCT  4-6. 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 History of amblyopia OS - pt reports history of patching as child - states baseline vision was ~20/40 OS, but thinks VA has improved since injury to OD - monitor   Ophthalmic Meds Ordered this visit:  No orders of the defined types were placed in this encounter.      No follow-ups on file.  There are no Patient Instructions on file for this visit.   Explained the diagnoses, plan, and follow up with the patient and they expressed understanding.  Patient expressed understanding of the importance of proper follow up care.   This document serves as a record of services personally performed by Karie ChimeraBrian G. Thi Klich, MD, PhD. It was created on their behalf by Laurian BrimAmanda Brown, OA, an ophthalmic assistant. The creation of this record is the provider's dictation and/or activities during the visit.    Electronically signed by: Laurian BrimAmanda Brown, OA  02.02.2020 7:57 PM     Karie ChimeraBrian G. Archit Leger, M.D., Ph.D. Diseases & Surgery of the Retina and Vitreous [facility]     Abbreviations: M myopia (nearsighted); A astigmatism; H hyperopia (farsighted); P presbyopia; Mrx spectacle prescription;  CTL contact lenses; OD right eye; OS left eye; OU both eyes  XT exotropia; ET esotropia; PEK punctate epithelial keratitis; PEE punctate epithelial erosions; DES dry eye syndrome; MGD meibomian gland dysfunction; ATs artificial tears; PFAT's preservative free artificial tears; NSC nuclear sclerotic cataract; PSC posterior subcapsular cataract; ERM epi-retinal membrane; PVD posterior vitreous  detachment; RD retinal detachment; DM diabetes mellitus; DR diabetic retinopathy; NPDR non-proliferative diabetic retinopathy; PDR proliferative diabetic retinopathy; CSME clinically significant macular edema; DME diabetic macular edema; dbh dot blot hemorrhages; CWS cotton wool spot; POAG primary open angle glaucoma; C/D cup-to-disc ratio; HVF humphrey visual field; GVF goldmann visual field; OCT optical coherence tomography; IOP intraocular pressure; BRVO Branch retinal vein occlusion; CRVO central retinal vein occlusion; CRAO central retinal artery occlusion; BRAO branch retinal artery occlusion; RT retinal tear; SB scleral buckle; PPV pars plana vitrectomy; VH Vitreous hemorrhage; PRP panretinal laser photocoagulation; IVK intravitreal kenalog; VMT vitreomacular traction; MH Macular hole;  NVD neovascularization of the disc; NVE neovascularization elsewhere; AREDS age related eye disease study; ARMD age related macular degeneration; POAG primary open angle glaucoma; EBMD epithelial/anterior basement membrane dystrophy; ACIOL anterior chamber intraocular lens; IOL intraocular lens; PCIOL posterior chamber intraocular lens; Phaco/IOL phacoemulsification with intraocular lens placement; PRK photorefractive keratectomy; LASIK laser assisted in situ keratomileusis; HTN hypertension; DM diabetes mellitus; COPD chronic obstructive pulmonary disease

## 2018-03-20 ENCOUNTER — Encounter (INDEPENDENT_AMBULATORY_CARE_PROVIDER_SITE_OTHER): Payer: Self-pay | Admitting: Ophthalmology

## 2018-03-20 ENCOUNTER — Ambulatory Visit (INDEPENDENT_AMBULATORY_CARE_PROVIDER_SITE_OTHER): Payer: Worker's Compensation | Admitting: Ophthalmology

## 2018-03-20 DIAGNOSIS — H53002 Unspecified amblyopia, left eye: Secondary | ICD-10-CM

## 2018-03-20 DIAGNOSIS — H4311 Vitreous hemorrhage, right eye: Secondary | ICD-10-CM

## 2018-03-20 DIAGNOSIS — H16001 Unspecified corneal ulcer, right eye: Secondary | ICD-10-CM

## 2018-03-20 DIAGNOSIS — S0451XS Injury of facial nerve, right side, sequela: Secondary | ICD-10-CM

## 2018-03-20 DIAGNOSIS — H3581 Retinal edema: Secondary | ICD-10-CM

## 2018-03-20 DIAGNOSIS — H0223A Paralytic lagophthalmos right eye, upper and lower eyelids: Secondary | ICD-10-CM

## 2018-03-20 DIAGNOSIS — T1490XA Injury, unspecified, initial encounter: Secondary | ICD-10-CM

## 2018-03-20 DIAGNOSIS — H16211 Exposure keratoconjunctivitis, right eye: Secondary | ICD-10-CM

## 2018-03-20 NOTE — Progress Notes (Addendum)
[facility] - Clinic Note  03/20/2018     CHIEF COMPLAINT Patient presents for Retina Follow Up   HISTORY OF PRESENT ILLNESS: Willie Blevins is a 43 y.o. male who presents to the clinic today for:   HPI    Retina Follow Up    Patient presents with  Other.  In right eye.  This started 1.5 months ago.  Severity is moderate.  Duration of 3 days.  Since onset it is stable.  I, the attending physician,  performed the HPI with the patient and updated documentation appropriately.          Comments    43 y/o male pt here for 3 day f/u.  S/p PPV for VH OD on 1.23.20.  Pt coming back today for eval of corneal abrasion OD.  No change in [place] O[place].  Denies eye pain, flashes, floaters, but RUL is very sore.  Ran out of antibiotic gtts last night.  PF QID OD Vigamox Q1H OD -- ran out last night Atropine BID OD PSO ung QID OD       Last edited by Rennis ChrisZamora, Taneika Choi, MD on 03/20/2018 10:50 AM. (History)      Referring physician: Caroline MoreWaldmann, Jonathan D, MD [address] ZO#[id number] [facility] [address], [place] [id number]  HISTORICAL INFORMATION:   Selected notes from the MEDICAL RECORD NUMBER Referred by Dr. Karleen HampshireSpencer LEE:  Ocular Hx- PMH-    CURRENT MEDICATIONS: Current Outpatient Medications (Ophthalmic Drugs)  Medication Sig  . artificial tears (LACRILUBE) OINT ophthalmic ointment Place into the right eye at bedtime and may repeat dose one time if needed.  . bacitracin-polymyxin b (POLYSPORIN) ophthalmic ointment Place into the right eye 6 (six) times daily. Place a 1/2 inch ribbon of ointment into the lower eyelid.  Marland Kitchen. moxifloxacin (VIGAMOX) 0.5 % ophthalmic solution Place 1 drop into the right eye every hour while awake.  . prednisoLONE acetate (PRED FORTE) 1 % ophthalmic suspension Place 1 drop into the right eye 4 (four) times daily.  Marland Kitchen. Propylene Glycol (SYSTANE BALANCE) 0.6 % SOLN Place 1 drop into the right eye as needed (dry eye).   No current  facility-administered medications for this visit.  (Ophthalmic Drugs)   Current Outpatient Medications (Other)  Medication Sig  . acetaminophen (TYLENOL) 500 MG tablet Take 1-2 tablets (500-1,000 mg total) by mouth every 8 (eight) hours. (Patient taking differently: Take 500-1,000 mg by mouth every 8 (eight) hours as needed for mild pain. )  . ascorbic acid (VITAMIN C) 1000 MG tablet Take 1 tablet (1,000 mg total) by mouth daily.  . benzocaine (ORAJEL) 10 % mucosal gel Use as directed in the mouth or throat 4 (four) times daily as needed for mouth pain (put a bedside).  . magnesium oxide (MAG-OX) 400 MG tablet Take 400 mg by mouth every evening.  . Melatonin 5 MG TABS Take 5 mg by mouth at bedtime.  . naproxen sodium (ALEVE) 220 MG tablet Take 440 mg by mouth 2 (two) times daily.  . pregabalin (LYRICA) 100 MG capsule Take 100 mg by mouth 3 (three) times daily.  . vitamin A [id number] UNIT capsule Take 10,000 Units by mouth daily.  . Vitamin D-Vitamin K (VITAMIN K2-VITAMIN D3 PO) Take 1 tablet by mouth daily.  . Zinc 30 MG CAPS Take 1 capsule by mouth daily.   No current facility-administered medications for this visit.  (Other)      REVIEW OF SYSTEMS: ROS  Positive for: Musculoskeletal, Eyes, Psychiatric   Negative for: Constitutional, Gastrointestinal, Neurological, Skin, Genitourinary, HENT, Endocrine, Cardiovascular, Respiratory, Allergic/Imm, Heme/Lymph   Last edited by Celine Mans, COA on 03/20/2018  9:01 AM. (History)       ALLERGIES Allergies  Allergen Reactions  . Penicillins Hives and Other (See Comments)    PATIENT HAS HAD A PCN REACTION WITH IMMEDIATE RASH, FACIAL/TONGUE/THROAT SWELLING, SOB, OR LIGHTHEADEDNESS WITH HYPOTENSION:  #  #  YES  #  #  Has patient had a PCN reaction causing severe rash involving mucus membranes or skin necrosis: No Has patient had a PCN reaction that required hospitalization: No Has patient had a PCN reaction occurring within the last 10  years: No If all of the above answers are "NO", then may proceed with Cephalosporin use.   . Adhesive [Tape] Rash    PAST MEDICAL HISTORY Past Medical History:  Diagnosis Date  . Anxiety    since accident  . Bullous emphysema (HCC)   . Complication of anesthesia    woke up "swinging"  . COPD (chronic obstructive pulmonary disease) (HCC)   . GERD (gastroesophageal reflux disease)   . History of kidney stones   . Open right fibular fracture 12/02/2017  . Pneumonia   . Strabismus    with lazy eye (left)  . Traumatic iritis   . Vitreous hemorrhage, right (HCC)    non- clearing   Past Surgical History:  Procedure Laterality Date  . AIR/FLUID EXCHANGE Right 03/09/2018   Procedure: AIR/FLUID EXCHANGE;  Surgeon: Rennis Chris, MD;  Location[facility] OR;  Service: Ophthalmology;  Laterality: Right;  . APPLICATION OF WOUND VAC Right 11/29/2017   Procedure: APPLICATION OF WOUND VAC;  Surgeon: Myrene Galas, MD;  Location: [facility] OR;  Service: Orthopedics;  Laterality: Right;  . FACIAL LACERATION REPAIR Left 11/29/2017   Procedure: ear LACERATION REPAIR;  Surgeon: Violeta Gelinas, MD;  Location: [facility] OR;  Service: General;  Laterality: Left;  . I&D EXTREMITY Right 11/29/2017   Procedure: IRRIGATION AND DEBRIDEMENT EXTREMITY;  Surgeon: Myrene Galas, MD;  Location: [facility] OR;  Service: Orthopedics;  Laterality: Right;  . I&D EXTREMITY Right 12/01/2017   Procedure: IRRIGATION AND DEBRIDEMENT RIGHT LOWER EXTREMITY AND VAC CHANGE;  Surgeon: Myrene Galas, MD;  Location: [facility] OR;  Service: Orthopedics;  Laterality: Right;  . INTRAMEDULLARY (IM) NAIL INTERTROCHANTERIC Left 11/29/2017   Procedure: INTRAMEDULLARY (IM) NAIL INTERTROCHANTRIC;  Surgeon: Myrene Galas, MD;  Location: [facility] OR;  Service: Orthopedics;  Laterality: Left;  Marland Kitchen MANDIBULAR HARDWARE REMOVAL N/A 01/06/2018   Procedure: MANDIBULAR HARDWARE REMOVAL;  Surgeon: Suzanna Obey, MD;  Location: [facility] OR;  Service: ENT;  Laterality: N/A;  . ORIF MANDIBULAR  FRACTURE N/A 12/01/2017   Procedure: OPEN REDUCTION INTERNAL FIXATION (ORIF) MANDIBULAR FRACTURE;  Surgeon: Suzanna Obey, MD;  Location: [facility] OR;  Service: ENT;  Laterality: N/A;  . PARS PLANA VITRECTOMY Right 03/09/2018   Procedure: PARS PLANA VITRECTOMY WITH 25 GAUGE;  Surgeon: Rennis Chris, MD;  Location: [facility] OR;  Service: Ophthalmology;  Laterality: Right;  . PHOTOCOAGULATION WITH LASER Right 03/09/2018   Procedure: PHOTOCOAGULATION WITH LASER;  Surgeon: Rennis Chris, MD;  Location: [facility] OR;  Service: Ophthalmology;  Laterality: Right;  . TRACHEOSTOMY TUBE PLACEMENT N/A 12/01/2017   Procedure: TRACHEOSTOMY;  Surgeon: Suzanna Obey, MD;  Location: [facility] OR;  Service: ENT;  Laterality: N/A;  . TYMPANOMASTOIDECTOMY Right 12/05/2017   Procedure: right canal up tympanomastoid, right mastoidectomy, exploration right facial nerve, Type 1 tympanoplasty, CSF leak repair.;  Surgeon: Ermalinda Barrios, MD;  Location: [facility] OR;  Service: ENT;  Laterality: Right;    FAMILY HISTORY Family History  Problem Relation Age of Onset  . Diabetes Mother   . Bell's palsy Mother   . Diabetes Maternal Uncle   . Diabetes Paternal Uncle     SOCIAL HISTORY Social History   Tobacco Use  . Smoking status: Former Smoker    Last attempt to quit: 12/21/2016    Years since quitting: 1.2  . Smokeless tobacco: Never Used  Substance Use Topics  . Alcohol use: Yes    Comment: rare  . Drug use: Never         OPHTHALMIC EXAM:  Base Eye Exam    Visual Acuity (Snellen - Linear)      Right Left   Dist Rule CF 20/30 -2   Dist ph [place]on NI NI       Tonometry (Tonopen, 9:03 AM)      Right Left   Pressure 9 9       Pupils      Dark Light Shape React APD   Right 5 5 Round No None   Left 3 2 Round Slow None  Pharm dilated OD       Visual Fields (Counting fingers)      Left Right    Full Full       Extraocular Movement      Right Left    Full, Ortho Full, Ortho       Neuro/Psych    Oriented x3:  Yes   Mood/Affect:   Normal        Slit Lamp and Fundus Exam    Slit Lamp Exam      Right Left   Lids/Lashes Ptosis; lagophthalmos; no blink Telangiectasia, Meibomian gland dysfunction, Ptosis   Conjunctiva/Sclera 1+injection, sutures intact White and quiet   Cornea 8x7.5 mm central/nasal epi defect with elevated/white rim and up to 50% thinning; 3-4+ PEE surrounding, irregular epithelium, 2+ Descemet's folds paracentral scar   Anterior Chamber mild fibrin reaction over the pupil improved Deep and quiet   Iris Round and dilated Round and dilated   Lens 2+ Nuclear sclerosis, 1+ Cortical cataract 2+ Nuclear sclerosis, 1+ Cortical cataract   Vitreous post vitrectomy clear       Fundus Exam      Right Left   Disc no view Pink and Sharp   C/D Ratio  0.3   Macula very hazy view, Flat Flat, Good foveal reflex, No heme or edema   Vessels Normal Normal   Periphery very hazy view, grossly attached; Good peripheral laser changes Attached             IMAGING AND PROCEDURES  Imaging and Procedures for @TODAY @           ASSESSMENT/PLAN:    ICD-10-CM   1. Vitreous hemorrhage of right eye (HCC) H43.11   2. Trauma T14.90XA   3. Retinal edema H35.81 CANCELED: OCT, Retina - OU - Both Eyes  4. 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 Vitreous Hemorrhage OD - likely sustained during traumatic tree injury on 10.15.19 - at presentation: VH white and chronic looking and quite dense, diffuse - now POW1 s/p PPV w/ laser, partial FAX OD, 1.23.2020             -  VH and vitreous cleared, but now with worsening sterile ulcer -- nasal/central -- was using vigamox/besivance q1h, but ran out last night  - epi defect 8 x 7.5 mm, >50% on nasal portion of cornea             - retina attached and in good position -- good  peripheral laser around breaks -- difficult view today             - IOP 9             - cont  Vigamox Q1H OD  - cont PSO q1h OD  - Cont  PF QID OD                         Atropine BID OD             - eye shield when sleeping             - post op drop and positioning instructions reviewed             - tylenol/ibuprofen for pain  - f/u 3-4 wks  - Fax notes to case manager, Ileana RoupAmy Pearson [phone]  3. No retinal edema on exam or OCT  4-7. 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 Selena BattenKim this afternoon - continue q1h vigamox and polysporin ophthalmic ointment  8. History of amblyopia OS - pt reports history of patching as child - states baseline vision was ~20/40 OS, but thinks VA has improved since injury to OD - monitor   Ophthalmic Meds Ordered this visit:  No orders of the defined types were placed in this encounter.      Return for f/u 3-4 weeks, VH OD, DFE, OCT.  There are no Patient Instructions on file for this visit.   Explained the diagnoses, plan, and follow up with the patient and they expressed understanding.  Patient expressed understanding of the importance of proper follow up care.   This document serves as a record of services personally performed by Karie ChimeraBrian G. Marty Uy, MD, PhD. It was created on their behalf by Laurian BrimAmanda Brown, OA, an ophthalmic assistant. The creation of this record is the provider's dictation and/or activities during the visit.    Electronically signed by: Laurian BrimAmanda Brown, OA  02.03.2020 10:59 AM    Karie ChimeraBrian G. Rashawd Laskaris, M.D., Ph.D. Diseases & Surgery of the Retina and Vitreous [facility]  I have reviewed the above documentation for accuracy and completeness, and I  agree with the above. Karie ChimeraBrian G. Kadin Bera, M.D., Ph.D. 03/20/18 10:59 AM    Abbreviations: M myopia (nearsighted); A astigmatism; H hyperopia (farsighted); P presbyopia; Mrx spectacle prescription;  CTL contact lenses; OD right eye; OS left eye; OU both eyes  XT exotropia; ET esotropia; PEK punctate epithelial keratitis; PEE punctate epithelial erosions; DES dry eye syndrome; MGD meibomian gland dysfunction; ATs artificial tears; PFAT's preservative free artificial tears; NSC nuclear sclerotic cataract; PSC posterior subcapsular cataract; ERM epi-retinal membrane; PVD posterior vitreous detachment; RD retinal detachment; DM diabetes mellitus; DR diabetic retinopathy; NPDR non-proliferative diabetic retinopathy; PDR proliferative diabetic retinopathy; CSME clinically significant macular edema; DME diabetic macular edema; dbh dot blot hemorrhages; CWS cotton wool spot; POAG primary open angle glaucoma; C/D  cup-to-disc ratio; HVF humphrey visual field; GVF goldmann visual field; OCT optical coherence tomography; IOP intraocular pressure; BRVO Branch retinal vein occlusion; CRVO central retinal vein occlusion; CRAO central retinal artery occlusion; BRAO branch retinal artery occlusion; RT retinal tear; SB scleral buckle; PPV pars plana vitrectomy; VH Vitreous hemorrhage; PRP panretinal laser photocoagulation; IVK intravitreal kenalog; VMT vitreomacular traction; MH Macular hole;  NVD neovascularization of the disc; NVE neovascularization elsewhere; AREDS age related eye disease study; ARMD age related macular degeneration; POAG primary open angle glaucoma; EBMD epithelial/anterior basement membrane dystrophy; ACIOL anterior chamber intraocular lens; IOL intraocular lens; PCIOL posterior chamber intraocular lens; Phaco/IOL phacoemulsification with intraocular lens placement; Brenda photorefractive keratectomy; LASIK laser assisted in situ keratomileusis; HTN hypertension; DM diabetes mellitus; COPD chronic obstructive  pulmonary disease

## 2018-03-27 ENCOUNTER — Telehealth (INDEPENDENT_AMBULATORY_CARE_PROVIDER_SITE_OTHER): Payer: Self-pay

## 2018-03-27 ENCOUNTER — Other Ambulatory Visit (INDEPENDENT_AMBULATORY_CARE_PROVIDER_SITE_OTHER): Payer: Self-pay | Admitting: Ophthalmology

## 2018-03-27 MED ORDER — MOXIFLOXACIN HCL 0.5 % OP SOLN: 1.0000 [drp] | OPHTHALMIC | 3 refills | Status: AC

## 2018-04-10 ENCOUNTER — Encounter (INDEPENDENT_AMBULATORY_CARE_PROVIDER_SITE_OTHER): Payer: Worker's Compensation | Admitting: Ophthalmology

## 2018-04-18 ENCOUNTER — Ambulatory Visit (INDEPENDENT_AMBULATORY_CARE_PROVIDER_SITE_OTHER): Payer: Worker's Compensation | Admitting: Ophthalmology

## 2018-04-18 ENCOUNTER — Encounter (INDEPENDENT_AMBULATORY_CARE_PROVIDER_SITE_OTHER): Payer: Self-pay | Admitting: Ophthalmology

## 2018-04-18 ENCOUNTER — Encounter (INDEPENDENT_AMBULATORY_CARE_PROVIDER_SITE_OTHER): Payer: Worker's Compensation | Admitting: Ophthalmology

## 2018-04-18 DIAGNOSIS — H16211 Exposure keratoconjunctivitis, right eye: Secondary | ICD-10-CM

## 2018-04-18 DIAGNOSIS — H3581 Retinal edema: Secondary | ICD-10-CM | POA: Diagnosis not present

## 2018-04-18 DIAGNOSIS — H0223A Paralytic lagophthalmos right eye, upper and lower eyelids: Secondary | ICD-10-CM

## 2018-04-18 DIAGNOSIS — H16001 Unspecified corneal ulcer, right eye: Secondary | ICD-10-CM

## 2018-04-18 DIAGNOSIS — S0451XS Injury of facial nerve, right side, sequela: Secondary | ICD-10-CM

## 2018-04-18 DIAGNOSIS — H4311 Vitreous hemorrhage, right eye: Secondary | ICD-10-CM

## 2018-04-18 DIAGNOSIS — T1490XA Injury, unspecified, initial encounter: Secondary | ICD-10-CM

## 2018-04-18 DIAGNOSIS — H53002 Unspecified amblyopia, left eye: Secondary | ICD-10-CM

## 2018-04-18 NOTE — Progress Notes (Signed)
[facility] - Clinic Note  04/18/2018     CHIEF COMPLAINT Patient presents for Retina Follow Up   HISTORY OF PRESENT ILLNESS: Willie Blevins is a 43 y.o. male who presents to the clinic today for:   HPI    Retina Follow Up    Patient presents with  Other.  In right eye.  Duration of 4 weeks.  Since onset it is gradually improving.  I, the attending physician,  performed the HPI with the patient and updated documentation appropriately.          Comments    Retina follow up for Vit Hem OD Patient went to [facility] on 04/14/18.[facility] stitched up the eye lid on Patient. Patient states some mild improvement.       Last edited by Rennis Chris, MD on 04/21/2018 11:29 PM. (History)    pt recently had a tarsorrhaphy [facility], pt states he is not in any pain, he states he only uses a couple eye drops a day to keep eye moist, pt has an appt at [facility] on March 27th   Referring physician: Caroline More, MD [id number] [facility] [address] [place], [place] [address]  HISTORICAL INFORMATION:   Selected notes from the MEDICAL RECORD NUMBER Referred by Dr. Karleen Hampshire LEE:  Ocular Hx- PMH-    CURRENT MEDICATIONS: Current Outpatient Medications (Ophthalmic Drugs)  Medication Sig  . artificial tears (LACRILUBE) OINT ophthalmic ointment Place into the right eye at bedtime and may repeat dose one time if needed.  Marland Kitchen Propylene Glycol (SYSTANE BALANCE) 0.6 % SOLN Place 1 drop into the right eye as needed (dry eye).  . bacitracin-polymyxin b (POLYSPORIN) ophthalmic ointment Place into the right eye 6 (six) times daily. Place a 1/2 inch ribbon of ointment into the lower eyelid.  Marland Kitchen moxifloxacin (VIGAMOX) 0.5 % ophthalmic solution Place 1 drop into the right eye every hour while awake. (Patient not taking: Reported on 04/18/2018)  . prednisoLONE acetate (PRED FORTE) 1 % ophthalmic suspension Place 1 drop into the right eye 4 (four) times daily. (Patient not taking: Reported  on 04/18/2018)   No current facility-administered medications for this visit.  (Ophthalmic Drugs)   Current Outpatient Medications (Other)  Medication Sig  . acetaminophen (TYLENOL) 500 MG tablet Take 1-2 tablets (500-1,000 mg total) by mouth every 8 (eight) hours. (Patient taking differently: Take 500-1,000 mg by mouth every 8 (eight) hours as needed for mild pain. )  . ascorbic acid (VITAMIN C) 1000 MG tablet Take 1 tablet (1,000 mg total) by mouth daily.  . magnesium oxide (MAG-OX) 400 MG tablet Take 400 mg by mouth every evening.  . Melatonin 5 MG TABS Take 5 mg by mouth at bedtime.  . naproxen sodium (ALEVE) 220 MG tablet Take 440 mg by mouth 2 (two) times daily.  . pregabalin (LYRICA) 100 MG capsule Take 100 mg by mouth 3 (three) times daily.  . vitamin A 98119 UNIT capsule Take 10,000 Units by mouth daily.  . Vitamin D-Vitamin K (VITAMIN K2-VITAMIN D3 PO) Take 1 tablet by mouth daily.  . Zinc 30 MG CAPS Take 1 capsule by mouth daily.  . benzocaine (ORAJEL) 10 % mucosal gel Use as directed in the mouth or throat 4 (four) times daily as needed for mouth pain (put a bedside).   No current facility-administered medications for this visit.  (Other)      REVIEW OF SYSTEMS: ROS    Positive for: Musculoskeletal, Eyes, Psychiatric  Negative for: Constitutional, Gastrointestinal, Neurological, Skin, Genitourinary, HENT, Endocrine, Cardiovascular, Respiratory, Allergic/Imm, Heme/Lymph   Last edited by Lana Fish on 04/18/2018  3:24 PM. (History)       ALLERGIES Allergies  Allergen Reactions  . Penicillins Hives and Other (See Comments)    PATIENT HAS HAD A PCN REACTION WITH IMMEDIATE RASH, FACIAL/TONGUE/THROAT SWELLING, SOB, OR LIGHTHEADEDNESS WITH HYPOTENSION:  #  #  YES  #  #  Has patient had a PCN reaction causing severe rash involving mucus membranes or skin necrosis: No Has patient had a PCN reaction that required hospitalization: No Has patient had a PCN reaction occurring  within the last 10 years: No If all of the above answers are "NO", then may proceed with Cephalosporin use.   . Adhesive [Tape] Rash    PAST MEDICAL HISTORY Past Medical History:  Diagnosis Date  . Anxiety    since accident  . Bullous emphysema (HCC)   . Complication of anesthesia    woke up "swinging"  . COPD (chronic obstructive pulmonary disease) (HCC)   . GERD (gastroesophageal reflux disease)   . History of kidney stones   . Open right fibular fracture 12/02/2017  . Pneumonia   . Strabismus    with lazy eye (left)  . Traumatic iritis   . Vitreous hemorrhage, right (HCC)    non- clearing   Past Surgical History:  Procedure Laterality Date  . AIR/FLUID EXCHANGE Right 03/09/2018   Procedure: AIR/FLUID EXCHANGE;  Surgeon: Rennis Chris, MD;  Location: [facility] OR;  Service: Ophthalmology;  Laterality: Right;  . APPLICATION OF WOUND VAC Right 11/29/2017   Procedure: APPLICATION OF WOUND VAC;  Surgeon: Myrene Galas, MD;  Location: [facility] OR;  Service: Orthopedics;  Laterality: Right;  . FACIAL LACERATION REPAIR Left 11/29/2017   Procedure: ear LACERATION REPAIR;  Surgeon: Violeta Gelinas, MD;  Location: [facility] OR;  Service: General;  Laterality: Left;  . I&D EXTREMITY Right 11/29/2017   Procedure: IRRIGATION AND DEBRIDEMENT EXTREMITY;  Surgeon: Myrene Galas, MD;  Location: [facility] OR;  Service: Orthopedics;  Laterality: Right;  . I&D EXTREMITY Right 12/01/2017   Procedure: IRRIGATION AND DEBRIDEMENT RIGHT LOWER EXTREMITY AND VAC CHANGE;  Surgeon: Myrene Galas, MD;  Location: [facility] OR;  Service: Orthopedics;  Laterality: Right;  . INTRAMEDULLARY (IM) NAIL INTERTROCHANTERIC Left 11/29/2017   Procedure: INTRAMEDULLARY (IM) NAIL INTERTROCHANTRIC;  Surgeon: Myrene Galas, MD;  Location: [facility] OR;  Service: Orthopedics;  Laterality: Left;  Marland Kitchen MANDIBULAR HARDWARE REMOVAL N/A 01/06/2018   Procedure: MANDIBULAR HARDWARE REMOVAL;  Surgeon: Suzanna Obey, MD;  Location: [facility] OR;  Service: ENT;  Laterality: N/A;   . ORIF MANDIBULAR FRACTURE N/A 12/01/2017   Procedure: OPEN REDUCTION INTERNAL FIXATION (ORIF) MANDIBULAR FRACTURE;  Surgeon: Suzanna Obey, MD;  Location: [facility] OR;  Service: ENT;  Laterality: N/A;  . PARS PLANA VITRECTOMY Right 03/09/2018   Procedure: PARS PLANA VITRECTOMY WITH 25 GAUGE;  Surgeon: Rennis Chris, MD;  Location: [facility] OR;  Service: Ophthalmology;  Laterality: Right;  . PHOTOCOAGULATION WITH LASER Right 03/09/2018   Procedure: PHOTOCOAGULATION WITH LASER;  Surgeon: Rennis Chris, MD;  Location: [facility] OR;  Service: Ophthalmology;  Laterality: Right;  . TRACHEOSTOMY TUBE PLACEMENT N/A 12/01/2017   Procedure: TRACHEOSTOMY;  Surgeon: Suzanna Obey, MD;  Location: [facility] OR;  Service: ENT;  Laterality: N/A;  . TYMPANOMASTOIDECTOMY Right 12/05/2017   Procedure: right canal up tympanomastoid, right mastoidectomy, exploration right facial nerve, Type 1 tympanoplasty, CSF leak repair.;  Surgeon: Ermalinda Barrios, MD;  Location: [facility] OR;  Service: ENT;  Laterality: Right;    FAMILY HISTORY Family History  Problem Relation Age of Onset  . Diabetes Mother   . Bell's palsy Mother   . Diabetes Maternal Uncle   . Diabetes Paternal Uncle     SOCIAL HISTORY Social History   Tobacco Use  . Smoking status: Former Smoker    Last attempt to quit: 12/21/2016    Years since quitting: 1.3  . Smokeless tobacco: Never Used  Substance Use Topics  . Alcohol use: Yes    Comment: rare  . Drug use: Never         OPHTHALMIC EXAM:  Base Eye Exam    Visual Acuity (Snellen - Linear)      Right Left   Dist [place] 20/350    Dist ph [place] 20/250-1        Pupils      Dark Light Shape React APD   Right        Left 3 2 Round Brisk None       Visual Fields      Left Right    Full Full       Extraocular Movement      Right Left    Full, Ortho Full, Ortho       Neuro/Psych    Oriented x3:  Yes   Mood/Affect:  Normal       Dilation    Right eye:  1.0% Mydriacyl, 2.5% Phenylephrine @ 3:36 PM        Slit  Lamp and Fundus Exam    Slit Lamp Exam      Right Left   Lids/Lashes Temporal tarsorrhaphy; Ptosis; lagophthalmos; no blink Telangiectasia, Meibomian gland dysfunction, Ptosis   Conjunctiva/Sclera 1+injection, sutures intact White and quiet   Cornea epi defect closed; 3+ PEE, irregular epithelium, corneal thinning/haze paracentral scar   Anterior Chamber mild fibrin reaction over the pupil improved Deep and quiet   Iris Round and dilated Round and dilated   Lens 2+ Nuclear sclerosis, 1+ Cortical cataract 2+ Nuclear sclerosis, 1+ Cortical cataract   Vitreous post vitrectomy; clear; no VH clear       Fundus Exam      Right Left   Disc Pink and Sharp Pink and Sharp   C/D Ratio 0.4 0.3   Macula Flat, Good foveal reflex Flat, Good foveal reflex, No heme or edema   Vessels Normal Normal   Periphery grossly attached; Good peripheral laser changes, poor view of temporal periphery due to tarsorrhaphy Attached             IMAGING AND PROCEDURES  Imaging and Procedures for @  OCT, Retina - OU - Both Eyes       Right Eye Quality was poor. Progression has no prior data.   Left Eye Quality was good. Central Foveal Thickness: 279. Progression has been stable. Findings include normal foveal contour, no SRF, no IRF.   Notes *Images captured and stored on drive  Diagnosis / Impression:  OD: no image obtained OS: NFP, No IRF/SRF   Clinical management:  See below  Abbreviations: NFP - Normal foveal profile. CME - cystoid macular edema. PED - pigment epithelial detachment. IRF - intraretinal fluid. SRF - subretinal fluid. EZ - ellipsoid zone. ERM - epiretinal membrane. ORA - outer retinal atrophy. ORT - outer retinal tubulation. SRHM - subretinal hyper-reflective material                 ASSESSMENT/PLAN:    ICD-10-CM   1. Vitreous  hemorrhage of right eye (HCC) H43.11   2. Trauma T14.90XA   3. Retinal edema H35.81 OCT, Retina - OU - Both Eyes  4. 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 Vitreous Hemorrhage OD - likely sustained during traumatic tree injury on 10.15.19 - at presentation: VH white and chronic looking and quite dense, diffuse - now POW6 s/p PPV w/ laser, partial FAX OD, 1.23.2020             - VH and vitreous cleared             - retina attached and in good position -- good peripheral laser around breaks -- improved view today             - IOP not checked today             - only using Systane drops and ointment at night             - tylenol/ibuprofen for pain  - f/u 6 wks  - Fax notes to case manager, Ileana Roup [phone]  3. No retinal edema on exam or OCT  4-7. 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 Selena Batten on March 27th for cornea follow up appt  8. History of amblyopia OS - pt reports history of patching as child - states baseline vision was ~20/40 OS, but thinks VA has improved since injury to OD - monitor   Ophthalmic Meds Ordered this visit:  No orders of the defined types were placed in this encounter.      Return in about 6 weeks (around 05/30/2018) for f/u VH OD, DFE, OCT.  There are no Patient Instructions on file for this visit.   Explained the diagnoses, plan, and follow up with the patient and they expressed understanding.  Patient expressed understanding of the importance of proper follow up care.   This document serves as a record of services personally performed by Karie Chimera, MD, PhD. It was created on their behalf by Laurian Brim, OA, an ophthalmic  assistant. The creation of this record is the provider's dictation and/or activities during the visit.    Electronically signed by: Laurian Brim, OA  03.03.2020 11:32 PM     Karie Chimera, M.D., Ph.D. Diseases & Surgery of the Retina and Vitreous [facility]  I have reviewed the above documentation for accuracy and completeness, and I agree with the above. Karie Chimera, M.D., Ph.D. 04/21/18 11:36 PM     Abbreviations: M myopia (nearsighted); A astigmatism; H hyperopia (farsighted); P presbyopia; Mrx spectacle prescription;  CTL contact lenses; OD right eye; OS left eye; OU both eyes  XT exotropia; ET esotropia; PEK punctate epithelial keratitis; PEE punctate epithelial erosions; DES dry eye syndrome; MGD meibomian gland dysfunction; ATs artificial tears; PFAT's preservative free artificial tears; NSC nuclear sclerotic cataract; PSC posterior subcapsular cataract; ERM epi-retinal membrane; PVD posterior vitreous detachment; RD retinal detachment; DM diabetes mellitus; DR diabetic retinopathy; NPDR non-proliferative diabetic retinopathy; PDR proliferative diabetic retinopathy; CSME clinically significant  macular edema; DME diabetic macular edema; dbh dot blot hemorrhages; CWS cotton wool spot; POAG primary open angle glaucoma; C/D cup-to-disc ratio; HVF humphrey visual field; GVF goldmann visual field; OCT optical coherence tomography; IOP intraocular pressure; BRVO Branch retinal vein occlusion; CRVO central retinal vein occlusion; CRAO central retinal artery occlusion; BRAO branch retinal artery occlusion; RT retinal tear; SB scleral buckle; PPV pars plana vitrectomy; VH Vitreous hemorrhage; PRP panretinal laser photocoagulation; IVK intravitreal kenalog; VMT vitreomacular traction; MH Macular hole;  NVD neovascularization of the disc; NVE neovascularization elsewhere; AREDS age related eye disease study; ARMD age related macular degeneration; POAG primary open angle  glaucoma; EBMD epithelial/anterior basement membrane dystrophy; ACIOL anterior chamber intraocular lens; IOL intraocular lens; PCIOL posterior chamber intraocular lens; Phaco/IOL phacoemulsification with intraocular lens placement; PRK photorefractive keratectomy; LASIK laser assisted in situ keratomileusis; HTN hypertension; DM diabetes mellitus; COPD chronic obstructive pulmonary disease

## 2018-04-21 ENCOUNTER — Encounter (INDEPENDENT_AMBULATORY_CARE_PROVIDER_SITE_OTHER): Payer: Self-pay | Admitting: Ophthalmology

## 2018-04-25 ENCOUNTER — Encounter (INDEPENDENT_AMBULATORY_CARE_PROVIDER_SITE_OTHER): Payer: Worker's Compensation | Admitting: Ophthalmology

## 2018-05-28 NOTE — Progress Notes (Signed)
[facility] - Clinic Note  05/30/2018     CHIEF COMPLAINT Patient presents for Retina Follow Up   HISTORY OF PRESENT ILLNESS: Willie Blevins is a 43 y.o. male who presents to the clinic today for:   HPI    Retina Follow Up    Patient presents with  Other.  In right eye.  This started 5 weeks ago.  I, the attending physician,  performed the HPI with the patient and updated documentation appropriately.          Comments    Patient here for 5 weeks retina follow up for [facility] OD (s/p PPV with laser 01.25) Patient states vision doing better OD. OS the same. No eye pain.       Last edited by Rennis Chris, MD on 05/30/2018  2:36 PM. (History)    pts appt at [facility] with corneal specialist was cancelled, pt is supposed to call back and reschedule when he is ready, pt states he does not feel like his vision is any worse since last visit, pt states he is using systane drops when he feels like he needs them and ointment at night before bad, he states he uses systane anywhere from 2-5 times a day, pt denies new floaters, he states his vision is still cloudy  Referring physician: Caroline More, MD [id number] [facility] [address] [place], [place] [address]  HISTORICAL INFORMATION:   Selected notes from the MEDICAL RECORD NUMBER Referred by Dr. Karleen Hampshire LEE:  Ocular Hx- PMH-    CURRENT MEDICATIONS: Current Outpatient Medications (Ophthalmic Drugs)  Medication Sig  . artificial tears (LACRILUBE) OINT ophthalmic ointment Place into the right eye at bedtime and may repeat dose one time if needed.  . bacitracin-polymyxin b (POLYSPORIN) ophthalmic ointment Place into the right eye 6 (six) times daily. Place a 1/2 inch ribbon of ointment into the lower eyelid.  Marland Kitchen moxifloxacin (VIGAMOX) 0.5 % ophthalmic solution Place 1 drop into the right eye every hour while awake. (Patient not taking: Reported on 04/18/2018)  . prednisoLONE acetate (PRED FORTE) 1 %  ophthalmic suspension Place 1 drop into the right eye 4 (four) times daily. (Patient not taking: Reported on 04/18/2018)  . Propylene Glycol (SYSTANE BALANCE) 0.6 % SOLN Place 1 drop into the right eye as needed (dry eye).   No current facility-administered medications for this visit.  (Ophthalmic Drugs)   Current Outpatient Medications (Other)  Medication Sig  . acetaminophen (TYLENOL) 500 MG tablet Take 1-2 tablets (500-1,000 mg total) by mouth every 8 (eight) hours. (Patient taking differently: Take 500-1,000 mg by mouth every 8 (eight) hours as needed for mild pain. )  . ascorbic acid (VITAMIN C) 1000 MG tablet Take 1 tablet (1,000 mg total) by mouth daily.  . benzocaine (ORAJEL) 10 % mucosal gel Use as directed in the mouth or throat 4 (four) times daily as needed for mouth pain (put a bedside).  . magnesium oxide (MAG-OX) 400 MG tablet Take 400 mg by mouth every evening.  . Melatonin 5 MG TABS Take 5 mg by mouth at bedtime.  . naproxen sodium (ALEVE) 220 MG tablet Take 440 mg by mouth 2 (two) times daily.  . pregabalin (LYRICA) 100 MG capsule Take 100 mg by mouth 3 (three) times daily.  . vitamin A 98119 UNIT capsule Take 10,000 Units by mouth daily.  . Vitamin D-Vitamin K (VITAMIN K2-VITAMIN D3 PO) Take 1 tablet by mouth daily.  . Zinc 30  MG CAPS Take 1 capsule by mouth daily.   No current facility-administered medications for this visit.  (Other)      REVIEW OF SYSTEMS: ROS    Positive for: Musculoskeletal, Eyes, Psychiatric   Negative for: Constitutional, Gastrointestinal, Neurological, Skin, Genitourinary, HENT, Endocrine, Cardiovascular, Respiratory, Allergic/Imm, Heme/Lymph   Last edited by Laddie Aquaslarke, Rebecca S on 05/30/2018  8:40 AM. (History)       ALLERGIES Allergies  Allergen Reactions  . Penicillins Hives and Other (See Comments)    PATIENT HAS HAD A PCN REACTION WITH IMMEDIATE RASH, FACIAL/TONGUE/THROAT SWELLING, SOB, OR LIGHTHEADEDNESS WITH HYPOTENSION:  #  #  YES  #   #  Has patient had a PCN reaction causing severe rash involving mucus membranes or skin necrosis: No Has patient had a PCN reaction that required hospitalization: No Has patient had a PCN reaction occurring within the last 10 years: No If all of the above answers are "NO", then may proceed with Cephalosporin use.   . Adhesive [Tape] Rash    PAST MEDICAL HISTORY Past Medical History:  Diagnosis Date  . Anxiety    since accident  . Bullous emphysema (HCC)   . Complication of anesthesia    woke up "swinging"  . COPD (chronic obstructive pulmonary disease) (HCC)   . GERD (gastroesophageal reflux disease)   . History of kidney stones   . Open right fibular fracture 12/02/2017  . Pneumonia   . Strabismus    with lazy eye (left)  . Traumatic iritis   . Vitreous hemorrhage, right (HCC)    non- clearing   Past Surgical History:  Procedure Laterality Date  . AIR/FLUID EXCHANGE Right 03/09/2018   Procedure: AIR/FLUID EXCHANGE;  Surgeon: Rennis ChrisZamora, Sayvion Vigen, MD;  [facility];  Service: Ophthalmology;  Laterality: Right;  . APPLICATION OF WOUND VAC Right 11/29/2017   Procedure: APPLICATION OF WOUND VAC;  Surgeon: Myrene GalasHandy, Michael, MD;  Location: [facility] OR;  Service: Orthopedics;  Laterality: Right;  . FACIAL LACERATION REPAIR Left 11/29/2017   Procedure: ear LACERATION REPAIR;  Surgeon: Violeta Gelinashompson, Burke, MD;  [facility];  Service: General;  Laterality: Left;  . I&D EXTREMITY Right 11/29/2017   Procedure: IRRIGATION AND DEBRIDEMENT EXTREMITY;  Surgeon: Myrene GalasHandy, Michael, MD;  [facility];  Service: Orthopedics;  Laterality: Right;  . I&D EXTREMITY Right 12/01/2017   Procedure: IRRIGATION AND DEBRIDEMENT RIGHT LOWER EXTREMITY AND VAC CHANGE;  Surgeon: Myrene GalasHandy, Michael, MD;  Location: [facility] OR;  Service: Orthopedics;  Laterality: Right;  . INTRAMEDULLARY (IM) NAIL INTERTROCHANTERIC Left 11/29/2017   Procedure: INTRAMEDULLARY (IM) NAIL INTERTROCHANTRIC;  Surgeon: Myrene GalasHandy, Michael, MD;  Location: [facility] OR;   Service: Orthopedics;  Laterality: Left;  Marland Kitchen. MANDIBULAR HARDWARE REMOVAL N/A 01/06/2018   Procedure: MANDIBULAR HARDWARE REMOVAL;  Surgeon: Suzanna ObeyByers, John, MD;  [facility];  Service: ENT;  Laterality: N/A;  . ORIF MANDIBULAR FRACTURE N/A 12/01/2017   Procedure: OPEN REDUCTION INTERNAL FIXATION (ORIF) MANDIBULAR FRACTURE;  Surgeon: Suzanna ObeyByers, John, MD;  [facility];  Service: ENT;  Laterality: N/A;  . PARS PLANA VITRECTOMY Right 03/09/2018   Procedure: PARS PLANA VITRECTOMY WITH 25 GAUGE;  Surgeon: Rennis ChrisZamora, Corey Laski, MD;  [facility];  Service: Ophthalmology;  Laterality: Right;  . PHOTOCOAGULATION WITH LASER Right 03/09/2018   Procedure: PHOTOCOAGULATION WITH LASER;  Surgeon: Rennis ChrisZamora, Contrell Ballentine, MD;  [facility];  Service: Ophthalmology;  Laterality: Right;  . TRACHEOSTOMY TUBE PLACEMENT N/A 12/01/2017   Procedure: TRACHEOSTOMY;  Surgeon: Suzanna ObeyByers, John, MD;  [facility];  Service: ENT;  Laterality:  N/A;  . TYMPANOMASTOIDECTOMY Right 12/05/2017   Procedure: right canal up tympanomastoid, right mastoidectomy, exploration right facial nerve, Type 1 tympanoplasty, CSF leak repair.;  Surgeon: Ermalinda Barrios, MD;  Location: [facility] OR;  Service: ENT;  Laterality: Right;    FAMILY HISTORY Family History  Problem Relation Age of Onset  . Diabetes Mother   . Bell's palsy Mother   . Diabetes Maternal Uncle   . Diabetes Paternal Uncle     SOCIAL HISTORY Social History   Tobacco Use  . Smoking status: Former Smoker    Last attempt to quit: 12/21/2016    Years since quitting: 1.4  . Smokeless tobacco: Never Used  Substance Use Topics  . Alcohol use: Yes    Comment: rare  . Drug use: Never         OPHTHALMIC EXAM:  Base Eye Exam    Visual Acuity (Snellen - Linear)      Right Left   Dist [place] CF at 2' 20/30 -1   Dist ph [place] NI NI       Tonometry (Tonopen, 8:35 AM)      Right Left   Pressure 12 12       Pupils      Dark Light Shape React APD   Right        Left 3 2 Round Brisk None        Visual Fields (Counting fingers)      Left Right     Full       Extraocular Movement      Right Left    0 0 0  0  0  0 0 0   0 0 0  0  0  0 0 0         Neuro/Psych    Oriented x3:  Yes   Mood/Affect:  Normal       Dilation    Right eye:  1.0% Mydriacyl, 2.5% Phenylephrine @ 8:35 AM        Slit Lamp and Fundus Exam    Slit Lamp Exam      Right Left   Lids/Lashes Temporal tarsorrhaphy; Ptosis; lagophthalmos; no blink Telangiectasia, Meibomian gland dysfunction, Ptosis   Conjunctiva/Sclera 1+injection, sutures intact White and quiet   Cornea 3+ PEE, irregular epithelium, corneal scar with opacification IN quadrant, corneal thinning along inferior portion of scar, fluorescein staining of epithelial scar, but epi defect is closed paracentral scar   Anterior Chamber mild fibrin reaction over the pupil improved Deep and quiet   Iris Round and dilated Round and dilated   Lens 2+ Nuclear sclerosis, 1+ Cortical cataract 2+ Nuclear sclerosis, 1+ Cortical cataract   Vitreous post vitrectomy; clear; no VH clear       Fundus Exam      Right Left   Disc Pink and Sharp Pink and Sharp   C/D Ratio 0.4 0.3   Macula Flat, Good foveal reflex Flat, Good foveal reflex, No heme or edema   Vessels Normal Normal   Periphery grossly attached; Good peripheral laser changes, poor view of temporal periphery due to tarsorrhaphy Attached             IMAGING AND PROCEDURES  Imaging and Procedures for @  OCT, Retina - OU - Both Eyes       Right Eye Quality was poor. Progression has no prior data.   Left Eye Quality was good. Central Foveal Thickness: 281. Progression has been stable. Findings include normal foveal contour, no  SRF, no IRF.   Notes *Images captured and stored on drive  Diagnosis / Impression:  OD: no image obtained OS: NFP, No IRF/SRF   Clinical management:  See below  Abbreviations: NFP - Normal foveal profile. CME - cystoid macular edema. PED -  pigment epithelial detachment. IRF - intraretinal fluid. SRF - subretinal fluid. EZ - ellipsoid zone. ERM - epiretinal membrane. ORA - outer retinal atrophy. ORT - outer retinal tubulation. SRHM - subretinal hyper-reflective material                 ASSESSMENT/PLAN:    ICD-10-CM   1. Vitreous hemorrhage of right eye (HCC) H43.11   2. Trauma T14.90XA   3. Retinal edema H35.81 OCT, Retina - OU - Both Eyes  4. 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 Vitreous Hemorrhage OD - sustained during traumatic tree injury on 10.15.19 - at presentation: VH white and chronic looking and quite dense, diffuse - now POW 11 s/p PPV w/ laser, partial FAX OD, 1.23.2020             - VH and vitreous cleared             - retina attached and in good position -- good peripheral laser around breaks             - IOP 12             - only using Systane drops and ointment at night             - tylenol/ibuprofen for pain  - f/u 9 months  - will fax notes to case manager, Ileana Roup [phone]  3. No retinal edema on exam or OCT  4-7. 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 Selena Batten for cornea follow up appt  8. History of amblyopia OS - pt reports history of patching as child - states baseline vision was ~20/40 OS, but thinks VA has improved since injury to OD - monitor   Ophthalmic Meds Ordered this visit:  No orders of the defined types were placed in this encounter.      Return in about 9 months (around 03/01/2019) for f/u VH OD, DFE,  OCT.  There are no Patient Instructions on file for this visit.   Explained the diagnoses, plan, and follow up with the patient and they expressed understanding.  Patient expressed understanding of the importance of proper follow up care.   This document serves as a record of services personally performed by Karie Chimera, MD, PhD. It was created on their behalf by Laurian Brim, OA, an ophthalmic assistant. The creation of this record is the provider's dictation and/or activities during the visit.    Electronically signed by: Laurian Brim, OA  04.12.2020 2:36 PM    Karie Chimera, M.D., Ph.D. Diseases & Surgery of the Retina and Vitreous [facility]  I have reviewed the above documentation for accuracy and completeness, and I agree with the above. Karie Chimera, M.D.,  Ph.D. 05/30/18 2:39 PM   Abbreviations: M myopia (nearsighted); A astigmatism; H hyperopia (farsighted); P presbyopia; Mrx spectacle prescription;  CTL contact lenses; OD right eye; OS left eye; OU both eyes  XT exotropia; ET esotropia; PEK punctate epithelial keratitis; PEE punctate epithelial erosions; DES dry eye syndrome; MGD meibomian gland dysfunction; ATs artificial tears; PFAT's preservative free artificial tears; NSC nuclear sclerotic cataract; PSC posterior subcapsular cataract; ERM epi-retinal membrane; PVD posterior vitreous detachment; RD retinal detachment; DM diabetes mellitus; DR diabetic retinopathy; NPDR non-proliferative diabetic retinopathy; PDR proliferative diabetic retinopathy; CSME clinically significant macular edema; DME diabetic macular edema; dbh dot blot hemorrhages; CWS cotton wool spot; POAG primary open angle glaucoma; C/D cup-to-disc ratio; HVF humphrey visual field; GVF goldmann visual field; OCT optical coherence tomography; IOP intraocular pressure; BRVO Branch retinal vein occlusion; CRVO central retinal vein occlusion; CRAO central retinal artery occlusion; BRAO  branch retinal artery occlusion; RT retinal tear; SB scleral buckle; PPV pars plana vitrectomy; VH Vitreous hemorrhage; PRP panretinal laser photocoagulation; IVK intravitreal kenalog; VMT vitreomacular traction; MH Macular hole;  NVD neovascularization of the disc; NVE neovascularization elsewhere; AREDS age related eye disease study; ARMD age related macular degeneration; POAG primary open angle glaucoma; EBMD epithelial/anterior basement membrane dystrophy; ACIOL anterior chamber intraocular lens; IOL intraocular lens; PCIOL posterior chamber intraocular lens; Phaco/IOL phacoemulsification with intraocular lens placement; PRK photorefractive keratectomy; LASIK laser assisted in situ keratomileusis; HTN hypertension; DM diabetes mellitus; COPD chronic obstructive pulmonary disease

## 2018-05-30 ENCOUNTER — Other Ambulatory Visit: Payer: Self-pay | Admitting: Physical Medicine & Rehabilitation

## 2018-05-30 ENCOUNTER — Other Ambulatory Visit: Payer: Self-pay

## 2018-05-30 ENCOUNTER — Ambulatory Visit (INDEPENDENT_AMBULATORY_CARE_PROVIDER_SITE_OTHER): Payer: Worker's Compensation | Admitting: Ophthalmology

## 2018-05-30 ENCOUNTER — Encounter (INDEPENDENT_AMBULATORY_CARE_PROVIDER_SITE_OTHER): Payer: Self-pay | Admitting: Ophthalmology

## 2018-05-30 ENCOUNTER — Encounter (INDEPENDENT_AMBULATORY_CARE_PROVIDER_SITE_OTHER): Payer: Worker's Compensation | Admitting: Ophthalmology

## 2018-05-30 DIAGNOSIS — H3581 Retinal edema: Secondary | ICD-10-CM

## 2018-05-30 DIAGNOSIS — F4310 Post-traumatic stress disorder, unspecified: Secondary | ICD-10-CM

## 2018-05-30 DIAGNOSIS — S8412XS Injury of peroneal nerve at lower leg level, left leg, sequela: Secondary | ICD-10-CM

## 2018-05-30 DIAGNOSIS — T1490XA Injury, unspecified, initial encounter: Secondary | ICD-10-CM

## 2018-05-30 DIAGNOSIS — H0223A Paralytic lagophthalmos right eye, upper and lower eyelids: Secondary | ICD-10-CM

## 2018-05-30 DIAGNOSIS — H53002 Unspecified amblyopia, left eye: Secondary | ICD-10-CM

## 2018-05-30 DIAGNOSIS — H16211 Exposure keratoconjunctivitis, right eye: Secondary | ICD-10-CM

## 2018-05-30 DIAGNOSIS — H4311 Vitreous hemorrhage, right eye: Secondary | ICD-10-CM

## 2018-05-30 DIAGNOSIS — S0451XS Injury of facial nerve, right side, sequela: Secondary | ICD-10-CM

## 2018-05-30 DIAGNOSIS — H16001 Unspecified corneal ulcer, right eye: Secondary | ICD-10-CM

## 2018-06-27 ENCOUNTER — Encounter (INDEPENDENT_AMBULATORY_CARE_PROVIDER_SITE_OTHER): Payer: Worker's Compensation | Admitting: Ophthalmology

## 2018-07-18 ENCOUNTER — Other Ambulatory Visit: Payer: Self-pay | Admitting: Orthopedic Surgery

## 2018-07-18 ENCOUNTER — Other Ambulatory Visit: Payer: Self-pay | Admitting: Family Medicine

## 2018-07-18 DIAGNOSIS — R600 Localized edema: Secondary | ICD-10-CM

## 2018-07-19 ENCOUNTER — Ambulatory Visit
Admission: RE | Admit: 2018-07-19 | Discharge: 2018-07-19 | Disposition: A | Discharge status: Home / Self Care | Payer: Worker's Compensation | Source: Ambulatory Visit | Attending: Orthopedic Surgery | Admitting: Orthopedic Surgery

## 2018-07-19 ENCOUNTER — Other Ambulatory Visit: Payer: Self-pay

## 2018-07-19 DIAGNOSIS — R600 Localized edema: Secondary | ICD-10-CM

## 2018-07-24 ENCOUNTER — Other Ambulatory Visit: Payer: Self-pay | Admitting: Orthopedic Surgery

## 2018-07-24 DIAGNOSIS — I872 Venous insufficiency (chronic) (peripheral): Secondary | ICD-10-CM

## 2018-07-24 DIAGNOSIS — R600 Localized edema: Secondary | ICD-10-CM

## 2018-07-26 ENCOUNTER — Ambulatory Visit
Admission: RE | Admit: 2018-07-26 | Discharge: 2018-07-26 | Disposition: A | Discharge status: Home / Self Care | Payer: 59 | Source: Ambulatory Visit | Attending: Orthopedic Surgery | Admitting: Orthopedic Surgery

## 2018-07-26 DIAGNOSIS — I872 Venous insufficiency (chronic) (peripheral): Secondary | ICD-10-CM

## 2018-07-26 DIAGNOSIS — R600 Localized edema: Secondary | ICD-10-CM
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
## 2019-03-02 ENCOUNTER — Encounter (INDEPENDENT_AMBULATORY_CARE_PROVIDER_SITE_OTHER): Payer: Worker's Compensation | Admitting: Ophthalmology

## 2019-07-06 NOTE — Progress Notes (Signed)
Pt left without being seen after being asked to take drug test
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
## 2019-08-27 NOTE — Progress Notes (Signed)
[facility] - Clinic Note  08/31/2019     CHIEF COMPLAINT Patient presents for Retina Follow Up   HISTORY OF PRESENT ILLNESS: Willie Blevins is a 44 y.o. male who presents to the clinic today for:   HPI    Retina Follow Up    Patient presents with  Other.  In right eye.  This started 18 months ago.  Severity is severe.  Duration of 15 months.  Since onset it is stable.  I, the attending physician,  performed the HPI with the patient and updated documentation appropriately.          Comments    44 y/o male pt here for 15 mo f/u for VH OD.  No change in [place] OU.  Denies pain, FOL, floaters.  Part of lids OD are sewn together.  Systane TID OD and QHS OD.  "Eye cream" QHS OD.       Last edited by Rennis Chris, MD on 08/31/2019  1:51 PM. (History)    pt feels like vision has improved a little in the right eye, he states he can make out objects, but cannot read with it, his last appt with [facility] was May 2020, he has no follow up appt with them, he uses lubricating drops and ointment before bed every night  Referring physician: Caroline More, MD [id number] [facility] [address] [place],  [place] [address]  HISTORICAL INFORMATION:   Selected notes from the MEDICAL RECORD NUMBER Referred by Dr. Karleen Hampshire LEE:  Ocular Hx- PMH-    CURRENT MEDICATIONS: Current Outpatient Medications (Ophthalmic Drugs)  Medication Sig  . artificial tears (LACRILUBE) OINT ophthalmic ointment Place into the right eye at bedtime and may repeat dose one time if needed.  . bacitracin-polymyxin b (POLYSPORIN) ophthalmic ointment Place into the right eye 6 (six) times daily. Place a 1/2 inch ribbon of ointment into the lower eyelid.  Marland Kitchen moxifloxacin (VIGAMOX) 0.5 % ophthalmic solution Place 1 drop into the right eye every hour while awake.  Marland Kitchen Propylene Glycol (SYSTANE BALANCE) 0.6 % SOLN Place 1 drop into the right eye as needed (dry eye).  . prednisoLONE acetate (PRED  FORTE) 1 % ophthalmic suspension Place 1 drop into the right eye 4 (four) times daily. (Patient not taking: Reported on 08/31/2019)   No current facility-administered medications for this visit. (Ophthalmic Drugs)   Current Outpatient Medications (Other)  Medication Sig  . pregabalin (LYRICA) 100 MG capsule Take 100 mg by mouth 3 (three) times daily.  . vitamin A 17616 UNIT capsule Take 10,000 Units by mouth daily.  Marland Kitchen acetaminophen (TYLENOL) 500 MG tablet Take 1-2 tablets (500-1,000 mg total) by mouth every 8 (eight) hours. (Patient not taking: Reported on 08/31/2019)  . ascorbic acid (VITAMIN C) 1000 MG tablet Take 1 tablet (1,000 mg total) by mouth daily. (Patient not taking: Reported on 08/31/2019)  . benzocaine (ORAJEL) 10 % mucosal gel Use as directed in the mouth or throat 4 (four) times daily as needed for mouth pain (put a bedside). (Patient not taking: Reported on 08/31/2019)  . magnesium oxide (MAG-OX) 400 MG tablet Take 400 mg by mouth every evening. (Patient not taking: Reported on 08/31/2019)  . Melatonin 5 MG TABS Take 5 mg by mouth at bedtime. (Patient not taking: Reported on 08/31/2019)  . naproxen sodium (ALEVE) 220 MG tablet Take 440 mg by mouth 2 (two) times daily. (Patient not taking: Reported on 08/31/2019)  . Vitamin D-Vitamin K (  VITAMIN K2-VITAMIN D3 PO) Take 1 tablet by mouth daily. (Patient not taking: Reported on 08/31/2019)  . Zinc 30 MG CAPS Take 1 capsule by mouth daily. (Patient not taking: Reported on 08/31/2019)   No current facility-administered medications for this visit. (Other)      REVIEW OF SYSTEMS: ROS    Positive for: Musculoskeletal, Eyes   Negative for: Constitutional, Gastrointestinal, Neurological, Skin, Genitourinary, HENT, Endocrine, Cardiovascular, Respiratory, Psychiatric, Allergic/Imm, Heme/Lymph   Last edited by Celine Mans, COA on 08/31/2019  1:24 PM. (History)       ALLERGIES Allergies  Allergen Reactions  . Penicillins Hives and Other  (See Comments)    PATIENT HAS HAD A PCN REACTION WITH IMMEDIATE RASH, FACIAL/TONGUE/THROAT SWELLING, SOB, OR LIGHTHEADEDNESS WITH HYPOTENSION:  #  #  YES  #  #  Has patient had a PCN reaction causing severe rash involving mucus membranes or skin necrosis: No Has patient had a PCN reaction that required hospitalization: No Has patient had a PCN reaction occurring within the last 10 years: No If all of the above answers are "NO", then may proceed with Cephalosporin use.   . Adhesive [Tape] Rash    PAST MEDICAL HISTORY Past Medical History:  Diagnosis Date  . Amblyopia    OS  . Anxiety    since accident  . Bullous emphysema (HCC)   . Complication of anesthesia    woke up "swinging"  . COPD (chronic obstructive pulmonary disease) (HCC)   . GERD (gastroesophageal reflux disease)   . History of kidney stones   . Open right fibular fracture 12/02/2017  . Pneumonia   . Strabismus    with lazy eye (left)  . Traumatic iritis   . Vitreous hemorrhage, right (HCC)    non- clearing   Past Surgical History:  Procedure Laterality Date  . AIR/FLUID EXCHANGE Right 03/09/2018   Procedure: AIR/FLUID EXCHANGE;  Surgeon: Rennis Chris, MD;  Location: [facility] OR;  Service: Ophthalmology;  Laterality: Right;  . APPLICATION OF WOUND VAC Right 11/29/2017   Procedure: APPLICATION OF WOUND VAC;  Surgeon: Myrene Galas, MD;  Location: [facility] OR;  Service: Orthopedics;  Laterality: Right;  . FACIAL LACERATION REPAIR Left 11/29/2017   Procedure: ear LACERATION REPAIR;  Surgeon: Violeta Gelinas, MD;  Location: [facility] OR;  Service: General;  Laterality: Left;  . I & D EXTREMITY Right 11/29/2017   Procedure: IRRIGATION AND DEBRIDEMENT EXTREMITY;  Surgeon: Myrene Galas, MD;  Location: [facility] OR;  Service: Orthopedics;  Laterality: Right;  . I & D EXTREMITY Right 12/01/2017   Procedure: IRRIGATION AND DEBRIDEMENT RIGHT LOWER EXTREMITY AND VAC CHANGE;  Surgeon: Myrene Galas, MD;  Location: [facility] OR;  Service: Orthopedics;   Laterality: Right;  . INTRAMEDULLARY (IM) NAIL INTERTROCHANTERIC Left 11/29/2017   Procedure: INTRAMEDULLARY (IM) NAIL INTERTROCHANTRIC;  Surgeon: Myrene Galas, MD;  Location: [facility] OR;  Service: Orthopedics;  Laterality: Left;  Marland Kitchen MANDIBULAR HARDWARE REMOVAL N/A 01/06/2018   Procedure: MANDIBULAR HARDWARE REMOVAL;  Surgeon: Suzanna Obey, MD;  Location: [facility] OR;  Service: ENT;  Laterality: N/A;  . ORIF MANDIBULAR FRACTURE N/A 12/01/2017   Procedure: OPEN REDUCTION INTERNAL FIXATION (ORIF) MANDIBULAR FRACTURE;  Surgeon: Suzanna Obey, MD;  Location: [facility] OR;  Service: ENT;  Laterality: N/A;  . PARS PLANA VITRECTOMY Right 03/09/2018   Procedure: PARS PLANA VITRECTOMY WITH 25 GAUGE;  Surgeon: Rennis Chris, MD;  Location: [facility] OR;  Service: Ophthalmology;  Laterality: Right;  . PHOTOCOAGULATION WITH LASER Right 03/09/2018   Procedure: PHOTOCOAGULATION WITH LASER;  Surgeon: Vanessa Barbara,  Arlys John, MD;  Location: [facility] OR;  Service: Ophthalmology;  Laterality: Right;  . TRACHEOSTOMY TUBE PLACEMENT N/A 12/01/2017   Procedure: TRACHEOSTOMY;  Surgeon: Suzanna Obey, MD;  Location: [facility] OR;  Service: ENT;  Laterality: N/A;  . TYMPANOMASTOIDECTOMY Right 12/05/2017   Procedure: right canal up tympanomastoid, right mastoidectomy, exploration right facial nerve, Type 1 tympanoplasty, CSF leak repair.;  Surgeon: Ermalinda Barrios, MD;  Location: [facility] OR;  Service: ENT;  Laterality: Right;    FAMILY HISTORY Family History  Problem Relation Age of Onset  . Diabetes Mother   . Bell's palsy Mother   . Diabetes Maternal Uncle   . Diabetes Paternal Uncle     SOCIAL HISTORY Social History   Tobacco Use  . Smoking status: Former Smoker    Quit date: 12/21/2016    Years since quitting: 2.6  . Smokeless tobacco: Never Used  Vaping Use  . Vaping Use: Former  Substance Use Topics  . Alcohol use: Yes    Comment: rare  . Drug use: Never         OPHTHALMIC EXAM:  Base Eye Exam    Visual Acuity (Snellen - Linear)      Right Left    Dist [place] CF @ 2' 20/30 -2   Dist ph [place] NI NI       Tonometry (Tonopen, 1:29 PM)      Right Left   Pressure Unab 12  Unable to assess OD due to eyelids being sewn together       Pupils      Dark Light Shape React APD   Right 4 3 Round Brisk None   Left 3 2 Round Brisk None       Visual Fields (Counting fingers)      Left Right    Full    Restrictions  Total superior temporal, inferior temporal deficiencies; Partial inner superior nasal, inferior nasal deficiencies       Extraocular Movement      Right Left    Full, Ortho Full, Ortho       Neuro/Psych    Oriented x3: Yes   Mood/Affect: Normal       Dilation    Both eyes: 1.0% Mydriacyl, 2.5% Phenylephrine @ 1:29 PM        Slit Lamp and Fundus Exam    Slit Lamp Exam      Right Left   Lids/Lashes Temporal tarsorrhaphy Telangiectasia, Meibomian gland dysfunction, Ptosis   Conjunctiva/Sclera White and quiet White and quiet   Cornea mild residual scar, but otherwise clear paracentral scar   Anterior Chamber Deep and quiet Deep and quiet   Iris Round and dilated Round and dilated   Lens 2+ Nuclear sclerosis, 1+ Cortical cataract 2+ Nuclear sclerosis, 1+ Cortical cataract   Vitreous post vitrectomy; clear; no VH clear       Fundus Exam      Right Left   Disc Pink and Sharp Pink and Sharp   C/D Ratio 0.4 0.3   Macula Flat, Good foveal reflex Flat, Good foveal reflex, No heme or edema   Vessels Normal Normal   Periphery Limited view, grossly attached; Good peripheral laser changes Attached           Refraction    Manifest Refraction      Sphere Cylinder Dist VA   Right      Left [place] Sphere 20/30-2  Unable to refract OD due to eyelids being sewn together  IMAGING AND PROCEDURES  Imaging and Procedures for @TODAY @  OCT, Retina - OU - Both Eyes       Right Eye Quality was poor. Progression has no prior data.   Left Eye Quality was good. Central Foveal Thickness: 282. Progression has been  stable. Findings include normal foveal contour, no SRF, no IRF.   Notes *Images captured and stored on drive  Diagnosis / Impression:  OD: no image obtained OS: NFP, No IRF/SRF   Clinical management:  See below  Abbreviations: NFP - Normal foveal profile. CME - cystoid macular edema. PED - pigment epithelial detachment. IRF - intraretinal fluid. SRF - subretinal fluid. EZ - ellipsoid zone. ERM - epiretinal membrane. ORA - outer retinal atrophy. ORT - outer retinal tubulation. SRHM - subretinal hyper-reflective material                 ASSESSMENT/PLAN:    ICD-10-CM   1. Vitreous hemorrhage of right eye (HCC)  H43.11   2. Trauma  T14.90XA   3. Retinal edema  H35.81 OCT, Retina - OU - Both Eyes  4. 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 Ulcer of right cornea  H16.001   8. Amblyopia, left eye  H53.002     1-2. Vitreous Hemorrhage OD  - sustained during traumatic tree injury on 10.15.19  - at presentation: VH white and chronic looking and quite dense, diffuse  - s/p PPV w/ laser, partial FAX OD, 1.23.2020             - VH and vitreous cleared             - retina attached and in good position -- good peripheral laser around breaks             - IOP unable to obtain today             - only using Systane drops and ointment at night             - tylenol/ibuprofen for pain  - f/u PRN  - will fax notes to [facility], [phone]  3. No retinal edema on exam or OCT  4-7. 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 [phone] and Oculoplastics team at [facility]  8. History of amblyopia OS  - pt reports  history of patching as child  - states baseline vision was ~20/40 OS, but thinks VA has improved since injury to OD  - monitor   Ophthalmic Meds Ordered this visit:  No orders of the defined types were placed in this encounter.      Return if symptoms worsen or fail to improve.  There are no Patient Instructions on file for this visit.   Explained the diagnoses, plan, and follow up with the patient and they expressed understanding.  Patient expressed understanding of the importance of proper follow up care.   This document serves as a record of services personally performed by [facility], MD, PhD. It was created on their behalf by Karie Chimera. Glee Arvin, OA an ophthalmic technician. The creation of this record is the provider's  dictation and/or activities during the visit.    Electronically signed by: Glee ArvinAmanda J. Manson PasseyBrown, [place] 07.12.2021 1:10 AM  Karie ChimeraBrian G. Mccartney Chuba, M.D., Ph.D. Diseases & Surgery of the Retina and Vitreous [facility]  I have reviewed the above documentation for accuracy and completeness, and I agree with the above. Karie ChimeraBrian G. Iven Earnhart, M.D., Ph.D. 09/02/19 1:10 AM   Abbreviations: M myopia (nearsighted); A astigmatism; H hyperopia (farsighted); P presbyopia; Mrx spectacle prescription;  CTL contact lenses; OD right eye; OS left eye; OU both eyes  XT exotropia; ET esotropia; PEK punctate epithelial keratitis; PEE punctate epithelial erosions; DES dry eye syndrome; MGD meibomian gland dysfunction; ATs artificial tears; PFAT's preservative free artificial tears; NSC nuclear sclerotic cataract; PSC posterior subcapsular cataract; ERM epi-retinal membrane; PVD posterior vitreous detachment; RD retinal detachment; DM diabetes mellitus; DR diabetic retinopathy; NPDR non-proliferative diabetic retinopathy; PDR proliferative diabetic retinopathy; CSME clinically significant macular edema; DME diabetic macular edema; dbh dot blot hemorrhages; CWS cotton wool spot; POAG  primary open angle glaucoma; C/D cup-to-disc ratio; HVF humphrey visual field; GVF goldmann visual field; OCT optical coherence tomography; IOP intraocular pressure; BRVO Branch retinal vein occlusion; CRVO central retinal vein occlusion; CRAO central retinal artery occlusion; BRAO branch retinal artery occlusion; RT retinal tear; SB scleral buckle; PPV pars plana vitrectomy; VH Vitreous hemorrhage; PRP panretinal laser photocoagulation; IVK intravitreal kenalog; VMT vitreomacular traction; MH Macular hole;  NVD neovascularization of the disc; NVE neovascularization elsewhere; AREDS age related eye disease study; ARMD age related macular degeneration; POAG primary open angle glaucoma; EBMD epithelial/anterior basement membrane dystrophy; ACIOL anterior chamber intraocular lens; IOL intraocular lens; PCIOL posterior chamber intraocular lens; Phaco/IOL phacoemulsification with intraocular lens placement; PRK photorefractive keratectomy; LASIK laser assisted in situ keratomileusis; HTN hypertension; DM diabetes mellitus; COPD chronic obstructive pulmonary disease

## 2019-08-31 ENCOUNTER — Ambulatory Visit (INDEPENDENT_AMBULATORY_CARE_PROVIDER_SITE_OTHER): Payer: Worker's Compensation | Admitting: Ophthalmology

## 2019-08-31 ENCOUNTER — Encounter (INDEPENDENT_AMBULATORY_CARE_PROVIDER_SITE_OTHER): Payer: Self-pay | Admitting: Ophthalmology

## 2019-08-31 ENCOUNTER — Other Ambulatory Visit: Payer: Self-pay

## 2019-08-31 DIAGNOSIS — H16211 Exposure keratoconjunctivitis, right eye: Secondary | ICD-10-CM

## 2019-08-31 DIAGNOSIS — H3581 Retinal edema: Secondary | ICD-10-CM | POA: Diagnosis not present

## 2019-08-31 DIAGNOSIS — H4311 Vitreous hemorrhage, right eye: Secondary | ICD-10-CM | POA: Diagnosis not present

## 2019-08-31 DIAGNOSIS — T1490XA Injury, unspecified, initial encounter: Secondary | ICD-10-CM

## 2019-08-31 DIAGNOSIS — H16001 Unspecified corneal ulcer, right eye: Secondary | ICD-10-CM

## 2019-08-31 DIAGNOSIS — H53002 Unspecified amblyopia, left eye: Secondary | ICD-10-CM

## 2019-08-31 DIAGNOSIS — S0451XS Injury of facial nerve, right side, sequela: Secondary | ICD-10-CM | POA: Diagnosis not present

## 2019-08-31 DIAGNOSIS — H0223A Paralytic lagophthalmos right eye, upper and lower eyelids: Secondary | ICD-10-CM

## 2019-09-02 ENCOUNTER — Encounter (INDEPENDENT_AMBULATORY_CARE_PROVIDER_SITE_OTHER): Payer: Self-pay | Admitting: Ophthalmology

## 2019-09-03 ENCOUNTER — Telehealth (INDEPENDENT_AMBULATORY_CARE_PROVIDER_SITE_OTHER): Payer: Self-pay

## 2020-03-20 IMAGING — US VENOUS DOPPLER ULTRASOUND OF  LOWER EXTREMITIES
1 series · 13 of 24 positions shown · non-contrast
Comparison: None.

CLINICAL DATA: Bilateral lower extremity blunt trauma November 2017.
Persistent lower extremity swelling. Left lateral calf unhealed
wound.

EXAM:
BILATERAL LOWER EXTREMITY VENOUS DUPLEX ULTRASOUND
TECHNIQUE: Gray-scale sonography with graded compression, as well as color
Doppler and duplex ultrasound, were performed to evaluate the deep
and superficial veins of both lower extremities. Spectral Doppler
was utilized to evaluate flow at rest and with distal augmentation
maneuvers. A complete superficial venous insufficiency exam was
performed in the upright standing position. I personally performed
the technical portion of the exam.

[Series 1: venous doppler ultrasound of lower extremities · 0.08mm/px · 13 of 57 slices shown]
[im 1/57]
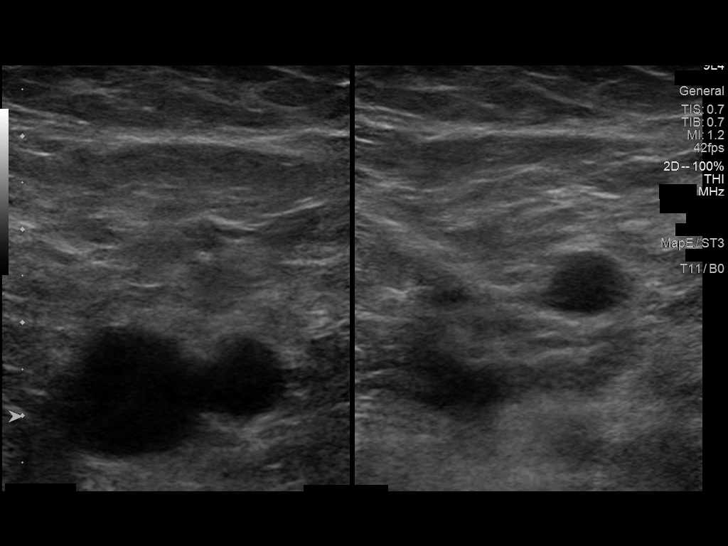
[im 5/57]
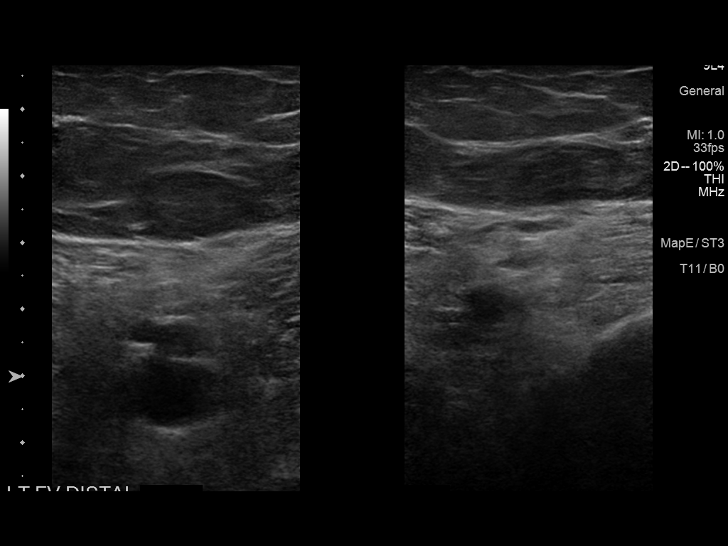
[im 10/57]
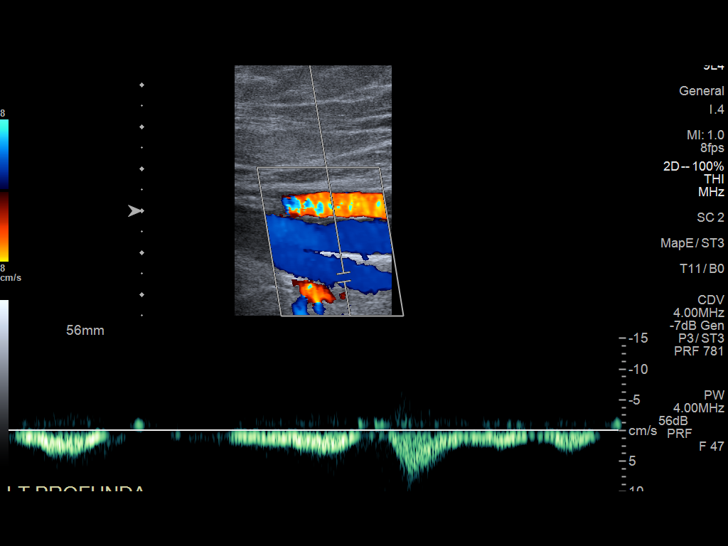
[im 15/57]
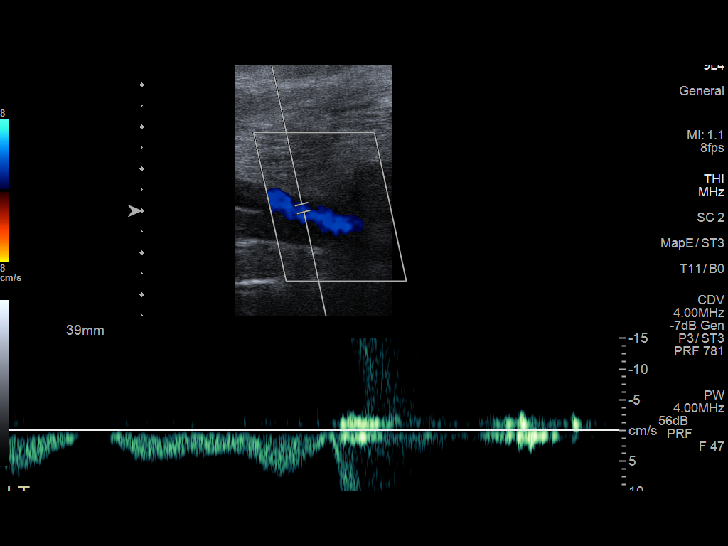
[im 20/57]
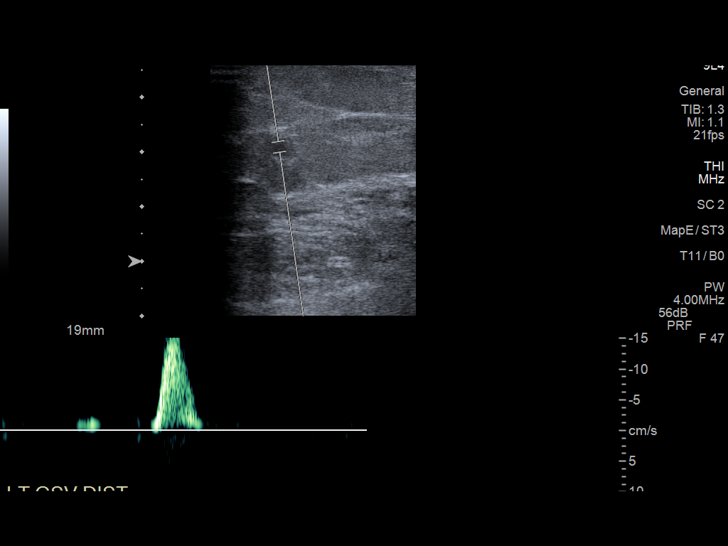
[im 25/57]
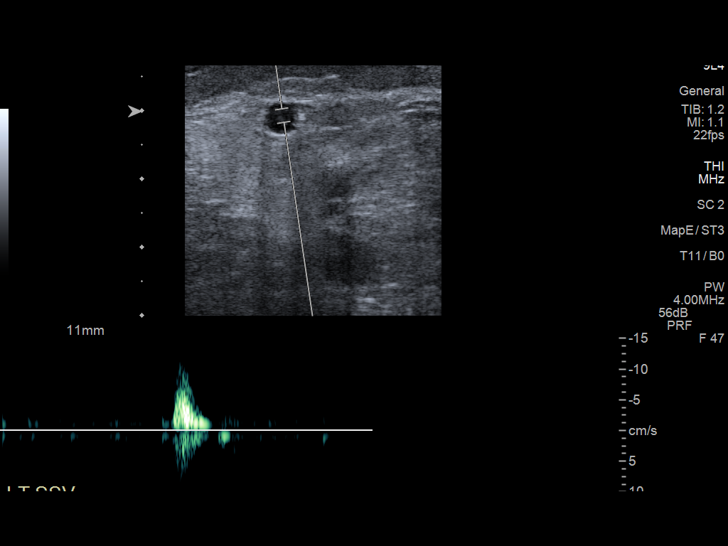
[im 30/57]
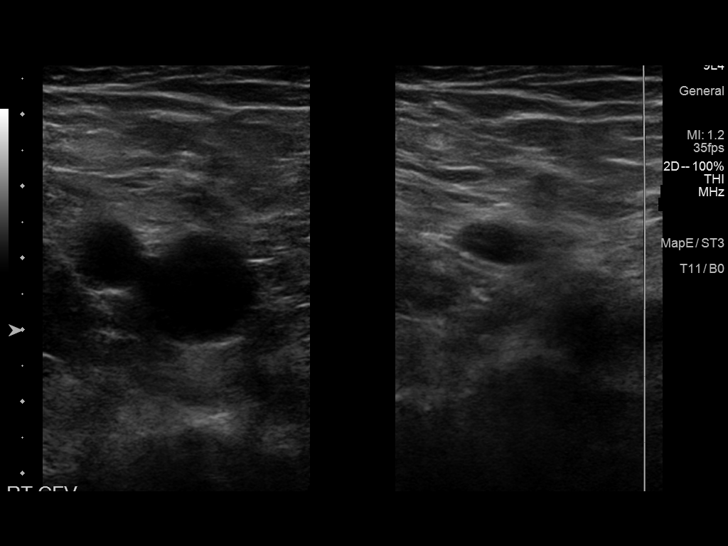
[im 32/57]
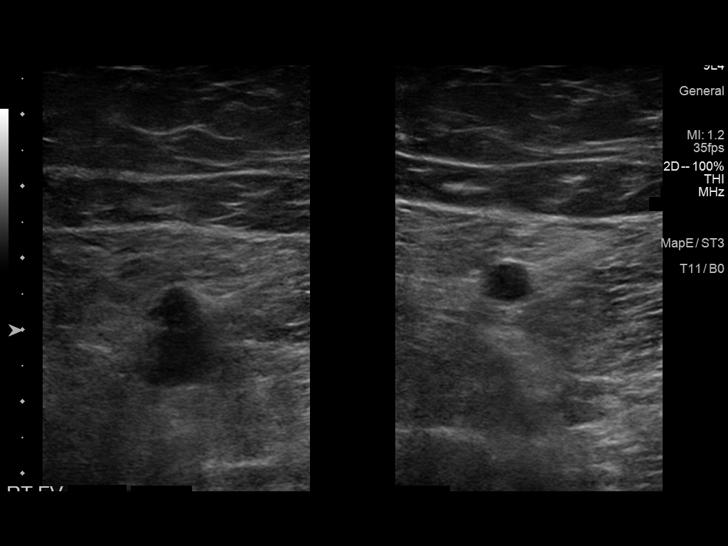
[im 37/57]
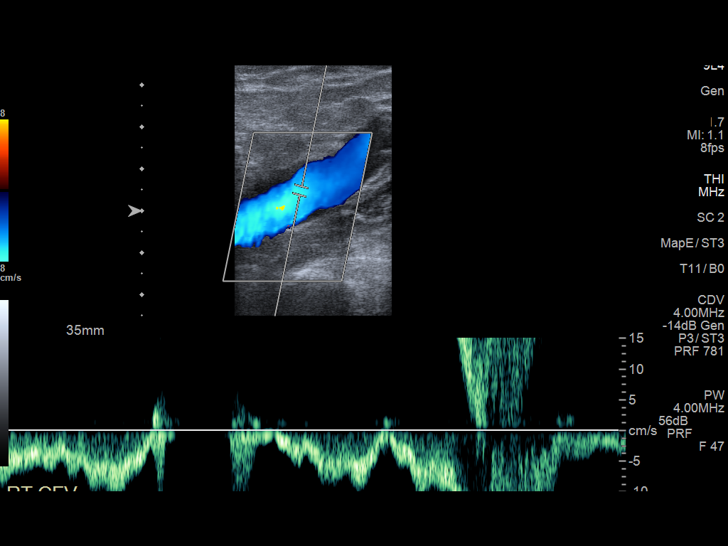
[im 42/57]
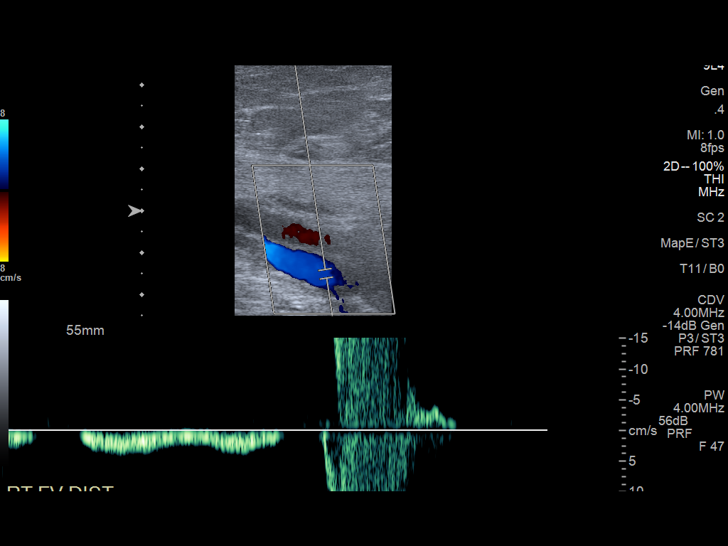
[im 47/57]
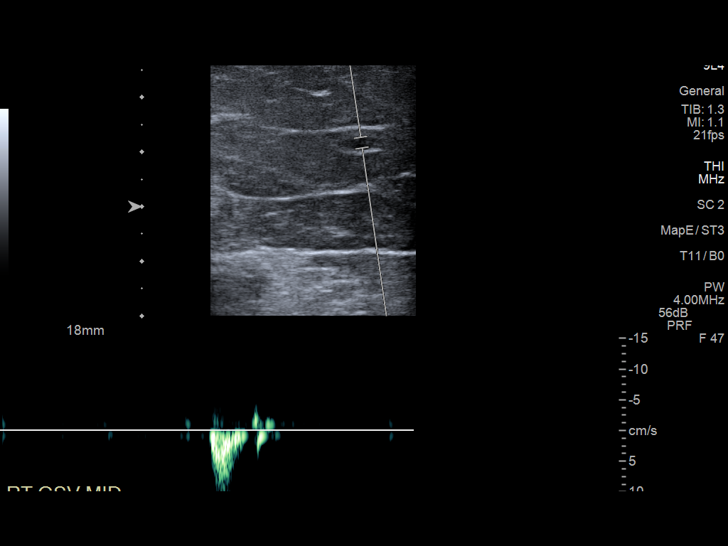
[im 52/57]
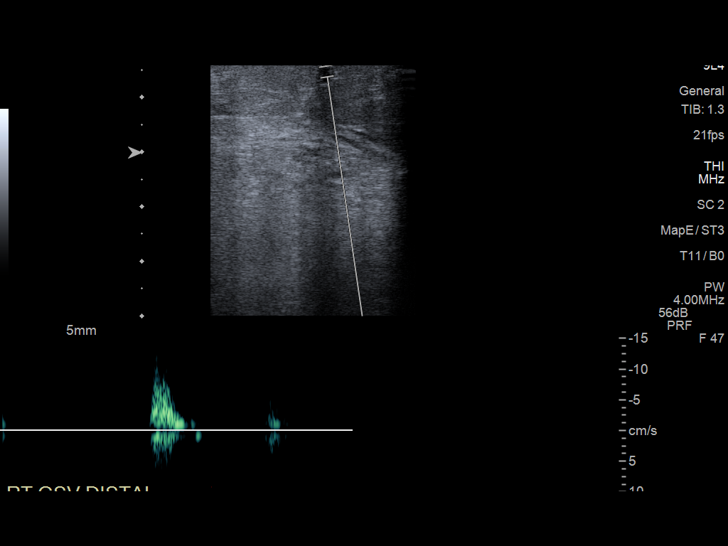
[im 57/57]
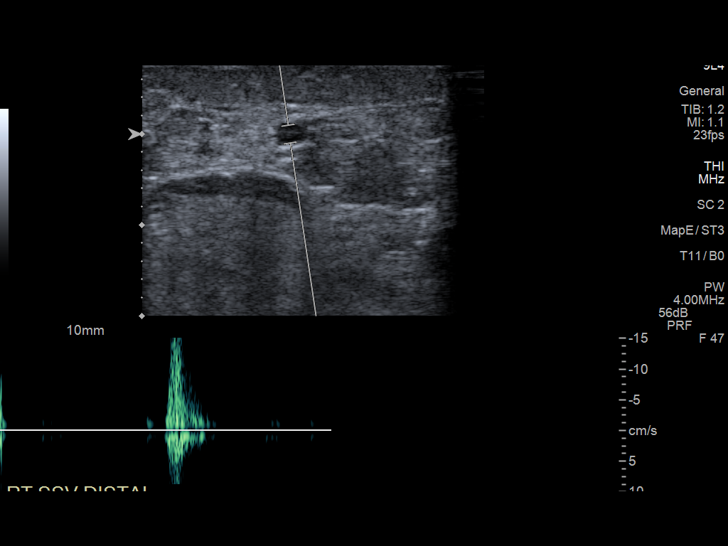

[13 of 24 positions shown; findings below may reference images not displayed]

FINDINGS: Deep Venous System:

Evaluation of the deep venous system including the common femoral,
femoral, profunda femoral, popliteal and calf veins (where visible)
demonstrate no evidence of deep venous thrombosis. The vessels are
compressible and demonstrate normal respiratory phasicity and
response to augmentation. No evidence of the deep venous reflux.

Superficial Venous System:

RIGHT

SFJ: Patent

GSV: Normal in caliber throughout. No evidence of valvular
incompetence or reflux. No significant associated varicose veins.

SPJ: Not visualized

SSV: Normal in caliber throughout. No evidence of valvular
incompetence or reflux. No significant associated varicose veins.

LEFT

SFJ: Patent

GSV: Normal in caliber throughout. No evidence of valvular
incompetence or reflux. No significant associated varicose veins.

SPJ: Not visualized

SSV: Normal in caliber throughout. No evidence of valvular
incompetence or reflux. No significant associated varicose veins.
IMPRESSION: 1. No femoropopliteal and no calf DVT in the visualized calf veins.
If clinical symptoms are inconsistent or if there are persistent or
worsening symptoms, further imaging (possibly involving the iliac
veins) may be warranted.
2. Normal bilateral lower extremity saphenous venous systems. No
evidence of valvular incompetence, reflux, or significant varicose
veins.
